# Patient Record
Sex: Female | Born: 1990 | Race: Black or African American | Hispanic: No | Marital: Single | Smoking: Former smoker
Health system: Southern US, Community
[De-identification: ages and names within clinical notes are randomized; demographics above are authoritative.]

## PROBLEM LIST (undated history)

## (undated) ENCOUNTER — Inpatient Hospital Stay (HOSPITAL_COMMUNITY): Payer: Self-pay

## (undated) DIAGNOSIS — N83209 Unspecified ovarian cyst, unspecified side: Secondary | ICD-10-CM

## (undated) DIAGNOSIS — O139 Gestational [pregnancy-induced] hypertension without significant proteinuria, unspecified trimester: Secondary | ICD-10-CM

## (undated) DIAGNOSIS — F329 Major depressive disorder, single episode, unspecified: Secondary | ICD-10-CM

## (undated) DIAGNOSIS — F909 Attention-deficit hyperactivity disorder, unspecified type: Secondary | ICD-10-CM

## (undated) DIAGNOSIS — O24419 Gestational diabetes mellitus in pregnancy, unspecified control: Secondary | ICD-10-CM

## (undated) DIAGNOSIS — I10 Essential (primary) hypertension: Secondary | ICD-10-CM

## (undated) DIAGNOSIS — F319 Bipolar disorder, unspecified: Secondary | ICD-10-CM

## (undated) DIAGNOSIS — F4310 Post-traumatic stress disorder, unspecified: Secondary | ICD-10-CM

## (undated) DIAGNOSIS — R011 Cardiac murmur, unspecified: Secondary | ICD-10-CM

## (undated) DIAGNOSIS — G4730 Sleep apnea, unspecified: Secondary | ICD-10-CM

## (undated) DIAGNOSIS — Z765 Malingerer [conscious simulation]: Secondary | ICD-10-CM

## (undated) DIAGNOSIS — K219 Gastro-esophageal reflux disease without esophagitis: Secondary | ICD-10-CM

## (undated) DIAGNOSIS — F32A Depression, unspecified: Secondary | ICD-10-CM

## (undated) DIAGNOSIS — F419 Anxiety disorder, unspecified: Secondary | ICD-10-CM

## (undated) HISTORY — PX: KNEE SURGERY: SHX244

## (undated) HISTORY — DX: Gestational diabetes mellitus in pregnancy, unspecified control: O24.419

## (undated) HISTORY — DX: Sleep apnea, unspecified: G47.30

## (undated) HISTORY — PX: LEG SURGERY: SHX1003

---

## 2011-11-25 ENCOUNTER — Encounter (HOSPITAL_BASED_OUTPATIENT_CLINIC_OR_DEPARTMENT_OTHER): Payer: Self-pay | Admitting: Emergency Medicine

## 2011-11-25 ENCOUNTER — Emergency Department (HOSPITAL_BASED_OUTPATIENT_CLINIC_OR_DEPARTMENT_OTHER)
Admission: EM | Admit: 2011-11-25 | Discharge: 2011-11-25 | Disposition: A | Discharge status: Home / Self Care | Payer: Medicaid Other | Attending: Emergency Medicine | Admitting: Emergency Medicine

## 2011-11-25 ENCOUNTER — Emergency Department (HOSPITAL_BASED_OUTPATIENT_CLINIC_OR_DEPARTMENT_OTHER): Payer: Medicaid Other

## 2011-11-25 DIAGNOSIS — N949 Unspecified condition associated with female genital organs and menstrual cycle: Secondary | ICD-10-CM | POA: Insufficient documentation

## 2011-11-25 DIAGNOSIS — N760 Acute vaginitis: Secondary | ICD-10-CM | POA: Insufficient documentation

## 2011-11-25 DIAGNOSIS — A599 Trichomoniasis, unspecified: Secondary | ICD-10-CM

## 2011-11-25 DIAGNOSIS — A499 Bacterial infection, unspecified: Secondary | ICD-10-CM | POA: Insufficient documentation

## 2011-11-25 DIAGNOSIS — N390 Urinary tract infection, site not specified: Secondary | ICD-10-CM | POA: Insufficient documentation

## 2011-11-25 DIAGNOSIS — B9689 Other specified bacterial agents as the cause of diseases classified elsewhere: Secondary | ICD-10-CM | POA: Insufficient documentation

## 2011-11-25 DIAGNOSIS — R1032 Left lower quadrant pain: Secondary | ICD-10-CM | POA: Insufficient documentation

## 2011-11-25 HISTORY — DX: Gastro-esophageal reflux disease without esophagitis: K21.9

## 2011-11-25 HISTORY — DX: Cardiac murmur, unspecified: R01.1

## 2011-11-25 LAB — URINALYSIS, ROUTINE W REFLEX MICROSCOPIC
Bilirubin Urine: NEGATIVE
Nitrite: NEGATIVE
Specific Gravity, Urine: 1.014 (ref 1.005–1.030)
pH: 5.5 (ref 5.0–8.0)

## 2011-11-25 LAB — URINE MICROSCOPIC-ADD ON: RBC / HPF: NONE SEEN RBC/hpf (ref <3)

## 2011-11-25 LAB — WET PREP, GENITAL: Yeast Wet Prep HPF POC: NONE SEEN

## 2011-11-25 LAB — PREGNANCY, URINE: Preg Test, Ur: NEGATIVE

## 2011-11-25 MED ORDER — CEPHALEXIN 250 MG PO CAPS
500.0000 mg | ORAL_CAPSULE | Freq: Once | ORAL | Status: AC
  Administered 2011-11-25: 500 mg via ORAL
  Filled 2011-11-25: qty 2

## 2011-11-25 MED ORDER — ONDANSETRON HCL 4 MG PO TABS: 4.0000 mg | ORAL_TABLET | Freq: Four times a day (QID) | ORAL | Status: AC

## 2011-11-25 MED ORDER — METRONIDAZOLE 500 MG PO TABS
2000.0000 mg | ORAL_TABLET | Freq: Once | ORAL | Status: AC
  Administered 2011-11-25: 2000 mg via ORAL
  Filled 2011-11-25: qty 4

## 2011-11-25 MED ORDER — CEPHALEXIN 500 MG PO CAPS: 500.0000 mg | ORAL_CAPSULE | Freq: Four times a day (QID) | ORAL | Status: AC

## 2011-11-25 MED ORDER — ONDANSETRON 4 MG PO TBDP
4.0000 mg | ORAL_TABLET | Freq: Once | ORAL | Status: AC
  Administered 2011-11-25: 4 mg via ORAL
  Filled 2011-11-25: qty 1

## 2011-11-25 NOTE — ED Notes (Signed)
Pt provided with crackers and peanut butter before med admin

## 2011-11-25 NOTE — ED Notes (Signed)
Pt presents today c/o lower L  adominal pain and left flank pain. Pt was seen [facility] 1 month ago and was dx with UTI. Pt advised that she was prescribed abx but did not finish prescription. Pt reports that sitting makes pain worsen. Pt denies dysuria but reports frequency. Pt denies diarrhea but reports vomiting x1 today. Pt denies fever.

## 2011-11-25 NOTE — ED Provider Notes (Signed)
History     CSN: [id number]  Arrival date & time 11/25/11  1229   First MD Initiated Contact with Patient 11/25/11 1305      Chief Complaint  Patient presents with  . Abdominal Pain    lower    (Consider location/radiation/quality/duration/timing/severity/associated sxs/prior treatment) HPI Comments: Patient presents of one month of left lower quadrant abdominal pain it radiates to her left flank. She was treated at another hospital for UTI 1 month ago but did not finish antibiotic. She has urinary frequency without pain. She started her menstrual period today. She has no fever, change in bowel habits. She one episode of vomiting today. She good by mouth intake and urine output.  Patient is a 21 y.o. female presenting with abdominal pain. The history is provided by the patient.  Abdominal Pain The primary symptoms of the illness include abdominal pain, nausea, vomiting and dysuria. The primary symptoms of the illness do not include fever, diarrhea, vaginal discharge or vaginal bleeding.  The dysuria is not associated with hematuria.   Symptoms associated with the illness do not include hematuria or back pain.    Past Medical History  Diagnosis Date  . 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 Acid reflux disease     No past surgical history on file.  No family history on file.  History  Substance Use Topics  . Smoking status: Current Some Day Smoker  . Smokeless tobacco: Never Used  . Alcohol Use: Yes     socially    OB History    Grav Para Term Preterm Abortions TAB SAB Ect Mult Living                  Review of Systems  Constitutional: Negative for fever, activity change and appetite change.  HENT: Negative for congestion and rhinorrhea.   Cardiovascular: Negative for chest pain.  Gastrointestinal: Positive for nausea, vomiting and abdominal pain. Negative for diarrhea and rectal pain.  Genitourinary: Positive for dysuria and pelvic pain. Negative for hematuria,  flank pain, vaginal bleeding and vaginal discharge.  Musculoskeletal: Negative for back pain.  Skin: Negative for rash.  Neurological: Negative for dizziness and headaches.    Allergies  Review of patient's allergies indicates no known allergies.  Home Medications   Current Outpatient Rx  Name Route Sig Dispense Refill  . CEPHALEXIN 500 MG PO CAPS Oral Take 1 capsule (500 mg total) by mouth 4 (four) times daily. 40 capsule 0  . ONDANSETRON HCL 4 MG PO TABS Oral Take 1 tablet (4 mg total) by mouth every 6 (six) hours. 12 tablet 0    BP 125/70  Pulse 84  Temp 98.5 F (36.9 C) (Oral)  Resp 20  Ht 5\' 6"  (1.676 m)  Wt 175 lb (79.379 kg)  BMI 28.25 kg/m2  SpO2 99%  LMP 11/23/2011  Physical Exam  Constitutional: She is oriented to person, place, and time. She appears well-developed and well-nourished. No distress.  HENT:  Head: Normocephalic and atraumatic.  Mouth/Throat: Oropharynx is clear and moist. No oropharyngeal exudate.  Eyes: Conjunctivae are normal. Pupils are equal, round, and reactive to light.  Neck: Normal range of motion.  Cardiovascular: Normal rate, regular rhythm and normal heart sounds.   No murmur heard. Pulmonary/Chest: Effort normal and breath sounds normal. No respiratory distress.  Abdominal: Soft. There is tenderness. There is no rebound and no guarding.       Mild left lower quadrant pain without guarding or rebound  Genitourinary: Cervix exhibits no motion tenderness. Right adnexum displays no mass and no tenderness. Left adnexum displays tenderness. No vaginal discharge found.       Dark blood in vaginal vault  Musculoskeletal: Normal range of motion. She exhibits no edema and no tenderness.       No CVA tenderness  Neurological: She is alert and oriented to person, place, and time. No cranial nerve deficit.  Skin: Skin is warm.    ED Course  Procedures (including critical care time)  Labs Reviewed  URINALYSIS, ROUTINE W REFLEX MICROSCOPIC -  Abnormal; Notable for the following:    APPearance CLOUDY (*)     Leukocytes, UA MODERATE (*)     All other components within normal limits  WET PREP, GENITAL - Abnormal; Notable for the following:    Trich, Wet Prep FEW (*)     Clue Cells Wet Prep HPF POC FEW (*)     WBC, Wet Prep HPF POC FEW (*)     All other components within normal limits  URINE MICROSCOPIC-ADD ON - Abnormal; Notable for the following:    Squamous Epithelial / LPF FEW (*)     Bacteria, UA MANY (*)     All other components within normal limits  PREGNANCY, URINE  GC/CHLAMYDIA PROBE AMP, GENITAL   US Transvaginal Non-ob  11/25/2011  *RADIOLOGY REPORT*  Clinical Data:  Left lower quadrant pain.  Ovarian torsion.  TRANSABDOMINAL AND TRANSVAGINAL ULTRASOUND OF PELVIS DOPPLER ULTRASOUND OF OVARIES  Technique:  Both transabdominal and transvaginal ultrasound examinations of the pelvis were performed. Transabdominal technique was performed for global imaging of the pelvis including uterus, ovaries, adnexal regions, and pelvic cul-de-sac.  It was necessary to proceed with endovaginal exam following the transabdominal exam to visualize the uterus and adnexa.  Color and duplex Doppler ultrasound was utilized to evaluate blood flow to the ovaries.  Comparison:  None.  Findings:  Uterus:  77 mm x 30 mm x 40 mm.  Normal appearance.  Endometrium:  2.5 mm, normal.  Right ovary: 31 mm x 21 mm x 20 mm.  Normal physiologic appearance.  Left ovary:    30 mm x 16 mm x 25 mm.  Normal physiologic appearance.  Pulsed Doppler evaluation demonstrates normal low-resistance arterial and venous waveforms in both ovaries.  IMPRESSION: Normal exam.  No evidence of pelvic mass or other significant abnormality.  No sonographic evidence for ovarian torsion.  Original Report Authenticated By: Andreas Newport, M.D.   US Pelvis Limited  11/25/2011  *RADIOLOGY REPORT*  Clinical Data:  Left lower quadrant pain.  Ovarian torsion.  TRANSABDOMINAL AND TRANSVAGINAL  ULTRASOUND OF PELVIS DOPPLER ULTRASOUND OF OVARIES  Technique:  Both transabdominal and transvaginal ultrasound examinations of the pelvis were performed. Transabdominal technique was performed for global imaging of the pelvis including uterus, ovaries, adnexal regions, and pelvic cul-de-sac.  It was necessary to proceed with endovaginal exam following the transabdominal exam to visualize the uterus and adnexa.  Color and duplex Doppler ultrasound was utilized to evaluate blood flow to the ovaries.  Comparison:  None.  Findings:  Uterus:  77 mm x 30 mm x 40 mm.  Normal appearance.  Endometrium:  2.5 mm, normal.  Right ovary: 31 mm x 21 mm x 20 mm.  Normal physiologic appearance.  Left ovary:    30 mm x 16 mm x 25 mm.  Normal physiologic appearance.  Pulsed Doppler evaluation demonstrates normal low-resistance arterial and venous waveforms in both ovaries.  IMPRESSION: Normal exam.  No evidence of pelvic mass or other significant abnormality.  No sonographic evidence for ovarian torsion.  Original Report Authenticated By: Andreas Newport, M.D.   [place] Art/ven Flow Abd Pelv Doppler  11/25/2011  *RADIOLOGY REPORT*  Clinical Data:  Left lower quadrant pain.  Ovarian torsion.  TRANSABDOMINAL AND TRANSVAGINAL ULTRASOUND OF PELVIS DOPPLER ULTRASOUND OF OVARIES  Technique:  Both transabdominal and transvaginal ultrasound examinations of the pelvis were performed. Transabdominal technique was performed for global imaging of the pelvis including uterus, ovaries, adnexal regions, and pelvic cul-de-sac.  It was necessary to proceed with endovaginal exam following the transabdominal exam to visualize the uterus and adnexa.  Color and duplex Doppler ultrasound was utilized to evaluate blood flow to the ovaries.  Comparison:  None.  Findings:  Uterus:  77 mm x 30 mm x 40 mm.  Normal appearance.  Endometrium:  2.5 mm, normal.  Right ovary: 31 mm x 21 mm x 20 mm.  Normal physiologic appearance.  Left ovary:    30 mm x 16 mm x 25 mm.   Normal physiologic appearance.  Pulsed Doppler evaluation demonstrates normal low-resistance arterial and venous waveforms in both ovaries.  IMPRESSION: Normal exam.  No evidence of pelvic mass or other significant abnormality.  No sonographic evidence for ovarian torsion.  Original Report Authenticated By: Andreas Newport, M.D.     1. Urinary tract infection   2. Trichomonas   3. Bacterial vaginosis       MDM  One month of constant left lower quadrant pain associated with urinary symptoms. Vital stable, no distress. Ovarian torsion less likely given the ongoing nature of pain  [place] negative for torsion or other ovarian pathology.  Labs remarkable for BV, trichomonas, UTI. Results discussed with patient and need for sexual partners to be treated. [facility]      Glynn Octave, MD 11/25/11 [phone]

## 2011-11-26 LAB — GC/CHLAMYDIA PROBE AMP, GENITAL: GC Probe Amp, Genital: NEGATIVE

## 2012-04-07 ENCOUNTER — Encounter (HOSPITAL_BASED_OUTPATIENT_CLINIC_OR_DEPARTMENT_OTHER): Payer: Self-pay

## 2012-04-07 ENCOUNTER — Emergency Department (HOSPITAL_BASED_OUTPATIENT_CLINIC_OR_DEPARTMENT_OTHER)
Admission: EM | Admit: 2012-04-07 | Discharge: 2012-04-07 | Disposition: A | Discharge status: Home / Self Care | Payer: Self-pay | Attending: Emergency Medicine | Admitting: Emergency Medicine

## 2012-04-07 DIAGNOSIS — F17200 Nicotine dependence, unspecified, uncomplicated: Secondary | ICD-10-CM | POA: Insufficient documentation

## 2012-04-07 DIAGNOSIS — F319 Bipolar disorder, unspecified: Secondary | ICD-10-CM | POA: Insufficient documentation

## 2012-04-07 DIAGNOSIS — Z3202 Encounter for pregnancy test, result negative: Secondary | ICD-10-CM | POA: Insufficient documentation

## 2012-04-07 DIAGNOSIS — Z79899 Other long term (current) drug therapy: Secondary | ICD-10-CM | POA: Insufficient documentation

## 2012-04-07 DIAGNOSIS — L299 Pruritus, unspecified: Secondary | ICD-10-CM | POA: Insufficient documentation

## 2012-04-07 DIAGNOSIS — F909 Attention-deficit hyperactivity disorder, unspecified type: Secondary | ICD-10-CM | POA: Insufficient documentation

## 2012-04-07 DIAGNOSIS — R011 Cardiac murmur, unspecified: Secondary | ICD-10-CM | POA: Insufficient documentation

## 2012-04-07 DIAGNOSIS — B86 Scabies: Secondary | ICD-10-CM | POA: Insufficient documentation

## 2012-04-07 DIAGNOSIS — Z8719 Personal history of other diseases of the digestive system: Secondary | ICD-10-CM | POA: Insufficient documentation

## 2012-04-07 HISTORY — DX: Attention-deficit hyperactivity disorder, unspecified type: F90.9

## 2012-04-07 HISTORY — DX: Bipolar disorder, unspecified: F31.9

## 2012-04-07 LAB — PREGNANCY, URINE: Preg Test, Ur: NEGATIVE

## 2012-04-07 MED ORDER — PERMETHRIN 0.4 % AERO: 1.0000 | INHALATION_SPRAY | Freq: Once | Status: DC

## 2012-04-07 MED ORDER — PERMETHRIN 5 % EX CREA: TOPICAL_CREAM | CUTANEOUS | Status: DC

## 2012-04-07 NOTE — ED Notes (Signed)
Generalized rash x 5 days

## 2012-04-07 NOTE — ED Provider Notes (Signed)
History     CSN: [id number]  Arrival date & time 04/07/12  1528   First MD Initiated Contact with Patient 04/07/12 1602      Chief Complaint  Patient presents with  . Rash    (Consider location/radiation/quality/duration/timing/severity/associated sxs/prior treatment) Patient is a 21 y.o. female presenting with rash. The history is provided by the patient.  Rash  This is a new problem. Episode onset: About 5 days. The problem has been gradually worsening. The problem is associated with an unknown factor. Affected Location: Generalized excluding palms, soles and face. The pain is at a severity of 0/10. The patient is experiencing no pain. The pain has been constant since onset. Associated symptoms include itching. She has tried nothing for the symptoms. The treatment provided no relief.    Past Medical History  Diagnosis Date  . 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 Acid reflux disease   . Bipolar disorder   . ADHD (attention deficit hyperactivity disorder)     History reviewed. No pertinent past surgical history.  No family history on file.  History  Substance Use Topics  . Smoking status: Current Some Day Smoker  . Smokeless tobacco: Never Used  . Alcohol Use: No    OB History    Grav Para Term Preterm Abortions TAB SAB Ect Mult Living                  Review of Systems  Genitourinary:       Unknown LMP requesting pregnancy test  Skin: Positive for itching and rash.  All other systems reviewed and are negative.    Allergies  Review of patient's allergies indicates no known allergies.  Home Medications   Current Outpatient Rx  Name  Route  Sig  Dispense  Refill  . QUETIAPINE FUMARATE 300 MG PO TABS   Oral   Take 300 mg by mouth at bedtime.           BP 127/74  Pulse 103  Temp 97.3 F (36.3 C) (Oral)  Resp 20  Ht 5\' 6"  (1.676 m)  Wt 182 lb 4.8 oz (82.691 kg)  BMI 29.42 kg/m2  SpO2 100%  Physical Exam  Nursing note and vitals  reviewed. Constitutional: She is oriented to person, place, and time. She appears well-developed and well-nourished. No distress.  HENT:  Head: Normocephalic and atraumatic.  Mouth/Throat: Oropharynx is clear and moist.  Eyes: Conjunctivae normal and EOM are normal. Pupils are equal, round, and reactive to light.  Neck: Normal range of motion. Neck supple.  Pulmonary/Chest: Effort normal.  Musculoskeletal: Normal range of motion. She exhibits no edema and no tenderness.  Neurological: She is alert and oriented to person, place, and time.  Skin: Skin is warm and dry. Rash noted. Rash is papular. No erythema.       Diffuse excoriated papular rash. No signs of erythema or pustules. Spares palms, soles and face  Psychiatric: She has a normal mood and affect. Her behavior is normal.    ED Course  Procedures (including critical care time)   Labs Reviewed  PREGNANCY, URINE   No results found.   1. Scabies       MDM   Patient with a rash most consistent with scabies. It does affect the arms, legs, torso and finger webs.  Significant other with similar symptoms. No symptoms suggestive of allergic reaction. Patient will be given permethrin and prednisone for itching. Also she is requesting a pregnancy test  Pregnancy neg      Gwyneth Sprout, MD 04/07/12 1626

## 2014-08-06 ENCOUNTER — Emergency Department (HOSPITAL_BASED_OUTPATIENT_CLINIC_OR_DEPARTMENT_OTHER): Payer: Self-pay

## 2014-08-06 ENCOUNTER — Encounter (HOSPITAL_BASED_OUTPATIENT_CLINIC_OR_DEPARTMENT_OTHER): Payer: Self-pay | Admitting: *Deleted

## 2014-08-06 ENCOUNTER — Emergency Department (HOSPITAL_BASED_OUTPATIENT_CLINIC_OR_DEPARTMENT_OTHER)
Admission: EM | Admit: 2014-08-06 | Discharge: 2014-08-07 | Disposition: A | Discharge status: Home / Self Care | Payer: Self-pay | Attending: Emergency Medicine | Admitting: Emergency Medicine

## 2014-08-06 DIAGNOSIS — K219 Gastro-esophageal reflux disease without esophagitis: Secondary | ICD-10-CM | POA: Insufficient documentation

## 2014-08-06 DIAGNOSIS — F319 Bipolar disorder, unspecified: Secondary | ICD-10-CM | POA: Insufficient documentation

## 2014-08-06 DIAGNOSIS — N8320 Unspecified ovarian cysts: Secondary | ICD-10-CM | POA: Insufficient documentation

## 2014-08-06 DIAGNOSIS — Z720 Tobacco use: Secondary | ICD-10-CM | POA: Insufficient documentation

## 2014-08-06 DIAGNOSIS — R1011 Right upper quadrant pain: Secondary | ICD-10-CM

## 2014-08-06 DIAGNOSIS — R011 Cardiac murmur, unspecified: Secondary | ICD-10-CM | POA: Insufficient documentation

## 2014-08-06 DIAGNOSIS — N83202 Unspecified ovarian cyst, left side: Secondary | ICD-10-CM

## 2014-08-06 DIAGNOSIS — Z79899 Other long term (current) drug therapy: Secondary | ICD-10-CM | POA: Insufficient documentation

## 2014-08-06 DIAGNOSIS — Z3202 Encounter for pregnancy test, result negative: Secondary | ICD-10-CM | POA: Insufficient documentation

## 2014-08-06 LAB — URINALYSIS, ROUTINE W REFLEX MICROSCOPIC
BILIRUBIN URINE: NEGATIVE
GLUCOSE, UA: NEGATIVE mg/dL
HGB URINE DIPSTICK: NEGATIVE
KETONES UR: 15 mg/dL — AB
Leukocytes, UA: NEGATIVE
Nitrite: NEGATIVE
PROTEIN: NEGATIVE mg/dL
Specific Gravity, Urine: 1.023 (ref 1.005–1.030)
UROBILINOGEN UA: 1 mg/dL (ref 0.0–1.0)
pH: 6 (ref 5.0–8.0)

## 2014-08-06 LAB — PREGNANCY, URINE: Preg Test, Ur: NEGATIVE

## 2014-08-06 NOTE — ED Notes (Signed)
Abdominal pain x 2 months. States her stomach is jumping.

## 2014-08-06 NOTE — ED Provider Notes (Signed)
CSN: [id number]     Arrival date & time 08/06/14  2159 History  This chart was scribed for Paula Libra, MD by Gwenyth Ober, ED Scribe. This patient was seen in room MH12/MH12 and the patient's care was started at 11:10 PM.    Chief Complaint  Patient presents with  . Abdominal Pain   The history is provided by the patient. No language interpreter was used.    HPI Comments: Cynthia Kent is a 24 y.o. female with a history of GERD who presents to the Emergency Department complaining of abdominal pain that started 2 months ago. Pt characterizes the pain as a sensation of something crawling around her abdomen. It can be moderate to severe at times. She states it is always present. She states nausea and daily episodes of vomiting that occur after eating. Pt has a history of GERD, but does not take any medications for this. She denies constipation, diarrhea, dysuria, vaginal bleeding and vaginal discharge as associated symptoms. She presents tonight because she got her Medicaid card today. There has not been an acute change in her symptomatology.  Past Medical History  Diagnosis Date  . 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 Acid reflux disease   . Bipolar disorder   . ADHD (attention deficit hyperactivity disorder)    History reviewed. No pertinent past surgical history. No family history on file. History  Substance Use Topics  . Smoking status: Current Some Day Smoker -- 0.50 packs/day    Types: Cigarettes  . Smokeless tobacco: Never Used  . Alcohol Use: No   OB History    No data available     Review of Systems  10 Systems reviewed and all are negative for acute change except as noted in the HPI.   Allergies  Review of patient's allergies indicates no known allergies.  Home Medications   Prior to Admission medications   Medication Sig Start Date End Date Taking? Authorizing Provider  permethrin (ELIMITE) 5 % cream Apply to affected area once and repeat in 1 week  04/07/12   Gwyneth Sprout, MD  Permethrin 0.4 % AERO 1 Can by Does not apply route once. 04/07/12   Gwyneth Sprout, MD  QUEtiapine (SEROQUEL) 300 MG tablet Take 300 mg by mouth at bedtime.    Historical Provider, MD   BP 140/90 mmHg  Pulse 67  Temp(Src) 98.7 F (37.1 C) (Oral)  Resp 18  SpO2 100%  LMP 07/12/2014   Physical Exam  Nursing note and vitals reviewed. General: Well-developed, well-nourished female in no acute distress; appearance consistent with age of record HENT: normocephalic; atraumatic Eyes: pupils equal, round and reactive to light; extraocular muscles intact Neck: supple Heart: regular rate and rhythm; no murmurs, rubs or gallops Lungs: clear to auscultation bilaterally Abdomen: soft; nondistended; epigastric, suprapubic and RUQ tenderness; no masses or hepatosplenomegaly; bowel sounds present  Extremities: No deformity; full range of motion; pulses normal Neurologic: Awake, alert and oriented; motor function intact in all extremities and symmetric; no facial droop Skin: Warm and dry Psychiatric: Normal mood and affect  ED Course  Procedures (including critical care time)  DIAGNOSTIC STUDIES: Oxygen Saturation is 100% on RA, normal by my interpretation.    COORDINATION OF CARE: 11:18 PM Discussed treatment plan with pt which includes [place] of her gall bladder and pelvis. Pt agreed to plan.   MDM   Nursing notes and vitals signs, including pulse oximetry, reviewed.  Summary of this visit's results, reviewed by myself:  Labs:  Results for orders placed or performed during the hospital encounter of 08/06/14 (from the past 24 hour(s))  Urinalysis, Routine w reflex microscopic     Status: Abnormal   Collection Time: 08/06/14 11:10 PM  Result Value Ref Range   Color, Urine YELLOW YELLOW   APPearance CLOUDY (A) CLEAR   Specific Gravity, Urine 1.023 1.005 - 1.030   pH 6.0 5.0 - 8.0   Glucose, UA NEGATIVE NEGATIVE mg/dL   Hgb urine dipstick NEGATIVE  NEGATIVE   Bilirubin Urine NEGATIVE NEGATIVE   Ketones, ur 15 (A) NEGATIVE mg/dL   Protein, ur NEGATIVE NEGATIVE mg/dL   Urobilinogen, UA 1.0 0.0 - 1.0 mg/dL   Nitrite NEGATIVE NEGATIVE   Leukocytes, UA NEGATIVE NEGATIVE  Pregnancy, urine     Status: None   Collection Time: 08/06/14 11:10 PM  Result Value Ref Range   Preg Test, Ur NEGATIVE NEGATIVE    Imaging Studies: [place] Abdomen Complete  08/07/2014   CLINICAL DATA:  Abdominal pain for 2 months.  EXAM: ULTRASOUND ABDOMEN COMPLETE  COMPARISON:  None. Examination performed during down time, technologist worksheet not available.  FINDINGS: Gallbladder: No gallstones or wall thickening visualized. No sonographic Murphy sign noted.  Common bile duct: Diameter: 1.3 mm, normal.  Liver: No focal lesion identified. Within normal limits in parenchymal echogenicity.  IVC: No abnormality visualized.  Pancreas: Visualized portion unremarkable.  Spleen: Size and appearance within normal limits.  Right Kidney: Length: 11.5 cm. Echogenicity within normal limits. No mass or hydronephrosis visualized.  Left Kidney: Length: 10.1 cm. Echogenicity within normal limits. No mass or hydronephrosis visualized.  Abdominal aorta: No aneurysm visualized.  Other findings: None.  No ascites.  IMPRESSION: Normal abdominal ultrasound.   Electronically Signed   By: Rubye OaksMelanie  Ehinger M.D.   On: 08/07/2014 00:34   [place] Transvaginal Non-ob  08/07/2014   CLINICAL DATA:  Pelvic pain for 2 months.  EXAM: TRANSABDOMINAL AND TRANSVAGINAL ULTRASOUND OF PELVIS  TECHNIQUE: Both transabdominal and transvaginal ultrasound examinations of the pelvis were performed. Transabdominal technique was performed for global imaging of the pelvis including uterus, ovaries, adnexal regions, and pelvic cul-de-sac. It was necessary to proceed with endovaginal exam following the transabdominal exam to visualize the ovaries.  COMPARISON:  11/25/2011  FINDINGS: Uterus  Measurements: 8 x 4 x 4 cm. No fibroids or  other mass visualized.  Endometrium  Thickness: 11 mm.  No focal abnormality visualized.  Right ovary  Measurements: 10 cc volume. Normal appearance/no adnexal mass.  Left ovary  Measurements: 17 cc volume. Corpus luteum present, likely with small internal hemorrhage.  Other findings  Minimal free fluid.  Exam dictated in down time status. Sonographer report currently not available.  IMPRESSION: 1. No acute findings. 2. Left corpus luteum.   Electronically Signed   By: Marnee SpringJonathon  Watts M.D.   On: 08/07/2014 00:33   [place] Pelvis Complete  08/07/2014   CLINICAL DATA:  Pelvic pain for 2 months.  EXAM: TRANSABDOMINAL AND TRANSVAGINAL ULTRASOUND OF PELVIS  TECHNIQUE: Both transabdominal and transvaginal ultrasound examinations of the pelvis were performed. Transabdominal technique was performed for global imaging of the pelvis including uterus, ovaries, adnexal regions, and pelvic cul-de-sac. It was necessary to proceed with endovaginal exam following the transabdominal exam to visualize the ovaries.  COMPARISON:  11/25/2011  FINDINGS: Uterus  Measurements: 8 x 4 x 4 cm. No fibroids or other mass visualized.  Endometrium  Thickness: 11 mm.  No focal abnormality visualized.  Right ovary  Measurements: 10 cc volume. Normal  appearance/no adnexal mass.  Left ovary  Measurements: 17 cc volume. Corpus luteum present, likely with small internal hemorrhage.  Other findings  Minimal free fluid.  Exam dictated in down time status. Sonographer report currently not available.  IMPRESSION: 1. No acute findings. 2. Left corpus luteum.   Electronically Signed   By: Marnee Spring M.D.   On: 08/07/2014 00:33     Final diagnoses:  RUQ abdominal pain  Gastroesophageal reflux disease without esophagitis  Cyst of left ovary   I personally performed the services described in this documentation, which was scribed in my presence. The recorded information has been reviewed and is accurate.    Paula Libra, MD 08/07/14 [phone]

## 2014-08-06 NOTE — ED Notes (Signed)
Patient transported to Ultrasound 

## 2014-08-06 NOTE — ED Notes (Signed)
Pt encouraged to attempt to obtain urine sample

## 2014-08-07 NOTE — ED Notes (Signed)
See paper charting for downtime reporting and charting.  

## 2014-08-15 ENCOUNTER — Encounter (HOSPITAL_COMMUNITY): Payer: Self-pay | Admitting: *Deleted

## 2014-08-15 ENCOUNTER — Inpatient Hospital Stay (HOSPITAL_COMMUNITY)
Admission: AD | Admit: 2014-08-15 | Discharge: 2014-08-15 | Disposition: A | Discharge status: Home / Self Care | Payer: Self-pay | Source: Ambulatory Visit | Attending: Family Medicine | Admitting: Family Medicine

## 2014-08-15 ENCOUNTER — Inpatient Hospital Stay (HOSPITAL_COMMUNITY): Payer: Medicaid Other

## 2014-08-15 DIAGNOSIS — Z8742 Personal history of other diseases of the female genital tract: Secondary | ICD-10-CM

## 2014-08-15 DIAGNOSIS — F17210 Nicotine dependence, cigarettes, uncomplicated: Secondary | ICD-10-CM | POA: Insufficient documentation

## 2014-08-15 DIAGNOSIS — R1032 Left lower quadrant pain: Secondary | ICD-10-CM | POA: Insufficient documentation

## 2014-08-15 DIAGNOSIS — N8320 Unspecified ovarian cysts: Secondary | ICD-10-CM | POA: Insufficient documentation

## 2014-08-15 DIAGNOSIS — K2100 Gastro-esophageal reflux disease with esophagitis, without bleeding: Secondary | ICD-10-CM

## 2014-08-15 HISTORY — DX: Depression, unspecified: F32.A

## 2014-08-15 HISTORY — DX: Unspecified ovarian cyst, unspecified side: N83.209

## 2014-08-15 HISTORY — DX: Major depressive disorder, single episode, unspecified: F32.9

## 2014-08-15 HISTORY — DX: Anxiety disorder, unspecified: F41.9

## 2014-08-15 LAB — POCT PREGNANCY, URINE: Preg Test, Ur: NEGATIVE

## 2014-08-15 LAB — WET PREP, GENITAL
Clue Cells Wet Prep HPF POC: NONE SEEN
TRICH WET PREP: NONE SEEN
WBC, Wet Prep HPF POC: NONE SEEN
YEAST WET PREP: NONE SEEN

## 2014-08-15 LAB — URINE MICROSCOPIC-ADD ON

## 2014-08-15 LAB — URINALYSIS, ROUTINE W REFLEX MICROSCOPIC
BILIRUBIN URINE: NEGATIVE
GLUCOSE, UA: NEGATIVE mg/dL
Ketones, ur: NEGATIVE mg/dL
Leukocytes, UA: NEGATIVE
Nitrite: NEGATIVE
Protein, ur: NEGATIVE mg/dL
UROBILINOGEN UA: 0.2 mg/dL (ref 0.0–1.0)
pH: 6 (ref 5.0–8.0)

## 2014-08-15 MED ORDER — NORGESTIMATE-ETH ESTRADIOL 0.25-35 MG-MCG PO TABS: 1.0000 | ORAL_TABLET | Freq: Every day | ORAL | Status: DC

## 2014-08-15 MED ORDER — OMEPRAZOLE 20 MG PO CPDR: 20.0000 mg | DELAYED_RELEASE_CAPSULE | Freq: Every day | ORAL | Status: DC

## 2014-08-15 NOTE — MAU Note (Signed)
Lower & mid abd pain for the past 2 months, [facility], dx'd with ovarian cyst, was advised to take motrin 800 mg for pain but pt says she hasn't taken it because it doesn't work.  Started bleeding last night, is not time for her period.
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
## 2014-08-15 NOTE — [facility] Provider Note (Signed)
Chief Complaint: Abdominal Pain   First Provider Initiated Contact with Patient 08/15/14 1748      SUBJECTIVE HPI: Cynthia Kent is a 24 y.o. who presents to maternity admissions reporting abdominal pain x 2 months, increasing at times and improving at times.  She has pain in her upper and lower abdomen described as cramping, burning, and sharp. The pain is associated with nausea and vomiting at least 1 time each day.  She has vaginal bleeding, similar to her menses but not at the time she expected.  She has irregular menses and often goes 4 months between periods.  Patient's last menstrual period was 07/12/2014 (approximate).  She is sexually active and is not using any form of birth control.  She was seen 08/06/14 at [facility] and diagnosed with GERD and ovarian cyst. She has not picked up the GERD medication prescribed at that time.  She was referred to [facility] for Gyn care but when she had increased pain today she came to [facility] since the clinic cannot see her today.  She denies vaginal itching/burning, urinary symptoms, h/a, dizziness, n/v, or fever/chills.    Of note, pt reports GERD diagnosed since childhood and vomiting is frequent for her depending on her diet.  She reports she has never taken medication for her GERD and has not seen a primary care provider in years.  Past Medical History  Diagnosis Date  . 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 Acid reflux disease   . Bipolar disorder   . ADHD (attention deficit hyperactivity disorder)   . Ovarian cyst   . Anxiety   . Depression    Past Surgical History  Procedure Laterality Date  . Leg surgery      when patient was 24 years old   History   Social History  . Marital Status: Single    Spouse Name: N/A  . Number of Children: N/A  . Years of Education: N/A   Occupational History  . Not on file.   Social History Main Topics  . Smoking status: Current Every Day Smoker -- 0.50 packs/day    Types: Cigarettes  .  Smokeless tobacco: Never Used  . Alcohol Use: Yes     Comment: occasionally  . Drug Use: No  . Sexual Activity: Yes    Birth Control/ Protection: None   Other Topics Concern  . Not on file   Social History Narrative   No current facility-administered medications on file prior to encounter.   Current Outpatient Prescriptions on File Prior to Encounter  Medication Sig Dispense Refill  . permethrin (ELIMITE) 5 % cream Apply to affected area once and repeat in 1 week (Patient not taking: Reported on 08/15/2014) 60 g 1  . Permethrin 0.4 % AERO 1 Can by Does not apply route once. (Patient not taking: Reported on 08/15/2014) 150 mL 0   Allergies  Allergen Reactions  . Latex Swelling    Irritates her skin     ROS: Pertinent items in HPI  OBJECTIVE Blood pressure 133/71, pulse 84, temperature 97.9 F (36.6 C), temperature source Oral, resp. rate 20, last menstrual period 07/12/2014. GENERAL: Well-developed, well-nourished female in no acute distress.  HEENT: Normocephalic HEART: normal rate RESP: normal effort ABDOMEN: Soft, non-tender EXTREMITIES: Nontender, no edema NEURO: Alert and oriented Pelvic exam: Cervix pink, visually closed, without lesion, scant white creamy discharge, vaginal walls and external genitalia normal Bimanual exam: Cervix 0/long/high, firm, anterior, neg CMT, uterus nontender, nonenlarged, adnexa  without tenderness, enlargement, or mass  LAB RESULTS Results for orders placed or performed during the hospital encounter of 08/15/14 (from the past 24 hour(s))  Urinalysis, Routine w reflex microscopic     Status: Abnormal   Collection Time: 08/15/14  4:57 PM  Result Value Ref Range   Color, Urine YELLOW YELLOW   APPearance HAZY (A) CLEAR   Specific Gravity, Urine >1.030 (H) 1.005 - 1.030   pH 6.0 5.0 - 8.0   Glucose, UA NEGATIVE NEGATIVE mg/dL   Hgb urine dipstick LARGE (A) NEGATIVE   Bilirubin Urine NEGATIVE NEGATIVE   Ketones, ur NEGATIVE NEGATIVE mg/dL    Protein, ur NEGATIVE NEGATIVE mg/dL   Urobilinogen, UA 0.2 0.0 - 1.0 mg/dL   Nitrite NEGATIVE NEGATIVE   Leukocytes, UA NEGATIVE NEGATIVE  Urine microscopic-add on     Status: Abnormal   Collection Time: 08/15/14  4:57 PM  Result Value Ref Range   Squamous Epithelial / LPF MANY (A) RARE   WBC, UA 0-2 <3 WBC/hpf   RBC / HPF 3-6 <3 RBC/hpf   Bacteria, UA RARE RARE   Urine-Other MUCOUS PRESENT   Pregnancy, urine POC     Status: None   Collection Time: 08/15/14  5:10 PM  Result Value Ref Range   Preg Test, Ur NEGATIVE NEGATIVE  Wet prep, genital     Status: None   Collection Time: 08/15/14  6:03 PM  Result Value Ref Range   Yeast Wet Prep HPF POC NONE SEEN NONE SEEN   Trich, Wet Prep NONE SEEN NONE SEEN   Clue Cells Wet Prep HPF POC NONE SEEN NONE SEEN   WBC, Wet Prep HPF POC NONE SEEN NONE SEEN    IMAGING  [place] Transvaginal Non-ob  08/15/2014   CLINICAL DATA:  Pelvic pain for the past 2 months. Vaginal bleeding. Last menstrual period 07/12/2014.  EXAM: ULTRASOUND PELVIS TRANSVAGINAL  TECHNIQUE: Transvaginal ultrasound examination of the pelvis was performed including evaluation of the uterus, ovaries, adnexal regions, and pelvic cul-de-sac.  COMPARISON:  08/07/2014.  FINDINGS: Uterus  Measurements: 8.3 x 4.3 x 3.4 cm. No fibroids or other mass visualized.  Endometrium  Thickness: 3.3 mm.  No focal abnormality visualized.  Right ovary  Measurements: 3.9 x 1.6 x 1.6 cm. Normal appearance/no adnexal mass.  Left ovary  Measurements: 2.6 x 2.2 x 1.9 cm. Normal appearance/no adnexal mass.  Other findings:  No free fluid  IMPRESSION: Normal examination.   Electronically Signed   By: Beckie SaltsSteven  Reid M.D.   On: 08/15/2014 19:15   [place] Transvaginal Non-ob  08/07/2014   CLINICAL DATA:  Pelvic pain for 2 months.  EXAM: TRANSABDOMINAL AND TRANSVAGINAL ULTRASOUND OF PELVIS  TECHNIQUE: Both transabdominal and transvaginal ultrasound examinations of the pelvis were performed. Transabdominal technique was  performed for global imaging of the pelvis including uterus, ovaries, adnexal regions, and pelvic cul-de-sac. It was necessary to proceed with endovaginal exam following the transabdominal exam to visualize the ovaries.  COMPARISON:  11/25/2011  FINDINGS: Uterus  Measurements: 8 x 4 x 4 cm. No fibroids or other mass visualized.  Endometrium  Thickness: 11 mm.  No focal abnormality visualized.  Right ovary  Measurements: 10 cc volume. Normal appearance/no adnexal mass.  Left ovary  Measurements: 17 cc volume. Corpus luteum present, likely with small internal hemorrhage.  Other findings  Minimal free fluid.  Exam dictated in down time status. Sonographer report currently not available.  IMPRESSION: 1. No acute findings. 2. Left corpus luteum.   Electronically Signed  By: Marnee Spring M.D.   On: 08/07/2014 00:33     ASSESSMENT 1. Gastroesophageal reflux disease with esophagitis   2. LLQ abdominal pain   3. Hx of ovarian cyst     PLAN Discussed functional CLC with pt on previous U/S, absence of cyst today Discharge home Rx for Prilosec daily for GERD, encouraged pt to pick up Rx and start taking Tylenol for pain instead of ibuprofen r/t GERD Discussed contraceptive choices with LARCs presented as most effective.  No personal or family hx of blood clots.  Risks reviewed with pt.  Sprintec daily for contraception for now, pt to f/u in [facility]. Pt has list of primary care providers from [facility] to [facility]  Follow-up Information    Please follow up.   Why:  Primary care provider for managment of acid reflux      Schedule an appointment as soon as possible for a visit with [facility].   Specialty:  Obstetrics and Gynecology   Why:  For routine Gyn care   Contact information:   [address] [place] [address] [phone]      Follow up with [facility] ADMISS[facility].   Why:  As needed for emergencies   Contact  information:   [address] [id number] mc [place] [address] [phone]      Sharen Counter Certified Nurse-Midwife 08/15/2014  7:31 PM

## 2014-08-15 NOTE — Discharge Instructions (Signed)
Food Choices for Gastroesophageal Reflux Disease When you have gastroesophageal reflux disease (GERD), the foods you eat and your eating habits are very important. Choosing the right foods can help ease the discomfort of GERD. WHAT GENERAL GUIDELINES DO I NEED TO FOLLOW?  Choose fruits, vegetables, whole grains, low-fat dairy products, and low-fat meat, fish, and poultry.  Limit fats such as oils, salad dressings, butter, nuts, and avocado.  Keep a food diary to identify foods that cause symptoms.  Avoid foods that cause reflux. These may be different for different people.  Eat frequent small meals instead of three large meals each day.  Eat your meals slowly, in a relaxed setting.  Limit fried foods.  Cook foods using methods other than frying.  Avoid drinking alcohol.  Avoid drinking large amounts of liquids with your meals.  Avoid bending over or lying down until 2-3 hours after eating. WHAT FOODS ARE NOT RECOMMENDED? The following are some foods and drinks that may worsen your symptoms: Vegetables Tomatoes. Tomato juice. Tomato and spaghetti sauce. Chili peppers. Onion and garlic. Horseradish. Fruits Oranges, grapefruit, and lemon (fruit and juice). Meats High-fat meats, fish, and poultry. This includes hot dogs, ribs, ham, sausage, salami, and bacon. Dairy Whole milk and chocolate milk. Sour cream. Cream. Butter. Ice cream. Cream cheese.  Beverages Coffee and tea, with or without caffeine. Carbonated beverages or energy drinks. Condiments Hot sauce. Barbecue sauce.  Sweets/Desserts Chocolate and cocoa. Donuts. Peppermint and spearmint. Fats and Oils High-fat foods, including [place] fries and potato chips. Other Vinegar. Strong spices, such as black pepper, white pepper, red pepper, cayenne, curry powder, cloves, ginger, and chili powder. The items listed above may not be a complete list of foods and beverages to avoid. Contact your dietitian for more  information. Document Released: 04/12/2005 Document Revised: 04/17/2013 Document Reviewed: 02/14/2013 [facility] Patient Information 2015 [place], [place]. This information is not intended to replace advice given to you by your health care provider. Make sure you discuss any questions you have with your health care provider. Gastroesophageal Reflux Disease, Adult Gastroesophageal reflux disease (GERD) happens when acid from your stomach flows up into the esophagus. When acid comes in contact with the esophagus, the acid causes soreness (inflammation) in the esophagus. Over time, GERD may create small holes (ulcers) in the lining of the esophagus. CAUSES   Increased body weight. This puts pressure on the stomach, making acid rise from the stomach into the esophagus.  Smoking. This increases acid production in the stomach.  Drinking alcohol. This causes decreased pressure in the lower esophageal sphincter (valve or ring of muscle between the esophagus and stomach), allowing acid from the stomach into the esophagus.  Late evening meals and a full stomach. This increases pressure and acid production in the stomach.  A malformed lower esophageal sphincter. Sometimes, no cause is found. SYMPTOMS   Burning pain in the lower part of the mid-chest behind the breastbone and in the mid-stomach area. This may occur twice a week or more often.  Trouble swallowing.  Sore throat.  Dry cough.  Asthma-like symptoms including chest tightness, shortness of breath, or wheezing. DIAGNOSIS  Your caregiver may be able to diagnose GERD based on your symptoms. In some cases, X-rays and other tests may be done to check for complications or to check the condition of your stomach and esophagus. TREATMENT  Your caregiver may recommend over-the-counter or prescription medicines to help decrease acid production. Ask your caregiver before starting or adding any new medicines.  HOME CARE  INSTRUCTIONS   Change the  factors that you can control. Ask your caregiver for guidance concerning weight loss, quitting smoking, and alcohol consumption.  Avoid foods and drinks that make your symptoms worse, such as:  Caffeine or alcoholic drinks.  Chocolate.  Peppermint or mint flavorings.  Garlic and onions.  Spicy foods.  Citrus fruits, such as oranges, lemons, or limes.  Tomato-based foods such as sauce, chili, salsa, and pizza.  Fried and fatty foods.  Avoid lying down for the 3 hours prior to your bedtime or prior to taking a nap.  Eat small, frequent meals instead of large meals.  Wear loose-fitting clothing. Do not wear anything tight around your waist that causes pressure on your stomach.  Raise the head of your bed 6 to 8 inches with wood blocks to help you sleep. Extra pillows will not help.  Only take over-the-counter or prescription medicines for pain, discomfort, or fever as directed by your caregiver.  Do not take aspirin, ibuprofen, or other nonsteroidal anti-inflammatory drugs (NSAIDs). SEEK IMMEDIATE MEDICAL CARE IF:   You have pain in your arms, neck, jaw, teeth, or back.  Your pain increases or changes in intensity or duration.  You develop nausea, vomiting, or sweating (diaphoresis).  You develop shortness of breath, or you faint.  Your vomit is green, yellow, black, or looks like coffee grounds or blood.  Your stool is red, bloody, or black. These symptoms could be signs of other problems, such as heart disease, gastric bleeding, or esophageal bleeding. MAKE SURE YOU:   Understand these instructions.  Will watch your condition.  Will get help right away if you are not doing well or get worse. Document Released: 01/20/2005 Document Revised: 07/05/2011 Document Reviewed: 10/30/2010 [facility] Patient Information 2015 [place], [place]. This information is not intended to replace advice given to you by your health care provider. Make sure you discuss any questions you have  with your health care provider.  Pelvic Pain Female pelvic pain can be caused by many different things and start from a variety of places. Pelvic pain refers to pain that is located in the lower half of the abdomen and between your hips. The pain may occur over a short period of time (acute) or may be reoccurring (chronic). The cause of pelvic pain may be related to disorders affecting the female reproductive organs (gynecologic), but it may also be related to the bladder, kidney stones, an intestinal complication, or muscle or skeletal problems. Getting help right away for pelvic pain is important, especially if there has been severe, sharp, or a sudden onset of unusual pain. It is also important to get help right away because some types of pelvic pain can be life threatening.  CAUSES  Below are only some of the causes of pelvic pain. The causes of pelvic pain can be in one of several categories.   Gynecologic.  Pelvic inflammatory disease.  Sexually transmitted infection.  Ovarian cyst or a twisted ovarian ligament (ovarian torsion).  Uterine lining that grows outside the uterus (endometriosis).  Fibroids, cysts, or tumors.  Ovulation.  Pregnancy.  Pregnancy that occurs outside the uterus (ectopic pregnancy).  Miscarriage.  Labor.  Abruption of the placenta or ruptured uterus.  Infection.  Uterine infection (endometritis).  Bladder infection.  Diverticulitis.  Miscarriage related to a uterine infection (septic abortion).  Bladder.  Inflammation of the bladder (cystitis).  Kidney stone(s).  Gastrointestinal.  Constipation.  Diverticulitis.  Neurologic.  Trauma.  Feeling pelvic pain because of mental or emotional  causes (psychosomatic).  Cancers of the bowel or pelvis. EVALUATION  Your caregiver will want to take a careful history of your concerns. This includes recent changes in your health, a careful gynecologic history of your periods (menses), and a  sexual history. Obtaining your family history and medical history is also important. Your caregiver may suggest a pelvic exam. A pelvic exam will help identify the location and severity of the pain. It also helps in the evaluation of which organ system may be involved. In order to identify the cause of the pelvic pain and be properly treated, your caregiver may order tests. These tests may include:   A pregnancy test.  Pelvic ultrasonography.  An X-ray exam of the abdomen.  A urinalysis or evaluation of vaginal discharge.  Blood tests. HOME CARE INSTRUCTIONS   Only take over-the-counter or prescription medicines for pain, discomfort, or fever as directed by your caregiver.   Rest as directed by your caregiver.   Eat a balanced diet.   Drink enough fluids to make your urine clear or pale yellow, or as directed.   Avoid sexual intercourse if it causes pain.   Apply warm or cold compresses to the lower abdomen depending on which one helps the pain.   Avoid stressful situations.   Keep a journal of your pelvic pain. Write down when it started, where the pain is located, and if there are things that seem to be associated with the pain, such as food or your menstrual cycle.  Follow up with your caregiver as directed.  SEEK MEDICAL CARE IF:  Your medicine does not help your pain.  You have abnormal vaginal discharge. SEEK IMMEDIATE MEDICAL CARE IF:   You have heavy bleeding from the vagina.   Your pelvic pain increases.   You feel light-headed or faint.   You have chills.   You have pain with urination or blood in your urine.   You have uncontrolled diarrhea or vomiting.   You have a fever or persistent symptoms for more than 3 days.  You have a fever and your symptoms suddenly get worse.   You are being physically or sexually abused.  MAKE SURE YOU:  Understand these instructions.  Will watch your condition.  Will get help if you are not doing  well or get worse. Document Released: 03/09/2004 Document Revised: 08/27/2013 Document Reviewed: 08/02/2011 [facility] Patient Information 2015 [place]. This information is not intended to replace advice given to you by your health care provider. Make sure you discuss any questions you have with your health care provider.

## 2014-08-16 LAB — GC/CHLAMYDIA PROBE AMP (~~LOC~~) NOT AT ARMC
Chlamydia: NEGATIVE
Neisseria Gonorrhea: NEGATIVE

## 2014-11-14 ENCOUNTER — Emergency Department (HOSPITAL_BASED_OUTPATIENT_CLINIC_OR_DEPARTMENT_OTHER): Payer: Medicaid Other

## 2014-11-14 ENCOUNTER — Emergency Department (HOSPITAL_BASED_OUTPATIENT_CLINIC_OR_DEPARTMENT_OTHER)
Admission: EM | Admit: 2014-11-14 | Discharge: 2014-11-14 | Disposition: A | Discharge status: Home / Self Care | Payer: Medicaid Other | Attending: Emergency Medicine | Admitting: Emergency Medicine

## 2014-11-14 ENCOUNTER — Encounter (HOSPITAL_BASED_OUTPATIENT_CLINIC_OR_DEPARTMENT_OTHER): Payer: Self-pay | Admitting: *Deleted

## 2014-11-14 DIAGNOSIS — Z720 Tobacco use: Secondary | ICD-10-CM | POA: Insufficient documentation

## 2014-11-14 DIAGNOSIS — R109 Unspecified abdominal pain: Secondary | ICD-10-CM

## 2014-11-14 DIAGNOSIS — B9689 Other specified bacterial agents as the cause of diseases classified elsewhere: Secondary | ICD-10-CM

## 2014-11-14 DIAGNOSIS — Z3202 Encounter for pregnancy test, result negative: Secondary | ICD-10-CM | POA: Insufficient documentation

## 2014-11-14 DIAGNOSIS — Z793 Long term (current) use of hormonal contraceptives: Secondary | ICD-10-CM | POA: Insufficient documentation

## 2014-11-14 DIAGNOSIS — K219 Gastro-esophageal reflux disease without esophagitis: Secondary | ICD-10-CM | POA: Insufficient documentation

## 2014-11-14 DIAGNOSIS — Z91040 Latex allergy status: Secondary | ICD-10-CM | POA: Insufficient documentation

## 2014-11-14 DIAGNOSIS — E669 Obesity, unspecified: Secondary | ICD-10-CM | POA: Insufficient documentation

## 2014-11-14 DIAGNOSIS — Z8659 Personal history of other mental and behavioral disorders: Secondary | ICD-10-CM | POA: Insufficient documentation

## 2014-11-14 DIAGNOSIS — N760 Acute vaginitis: Secondary | ICD-10-CM | POA: Insufficient documentation

## 2014-11-14 DIAGNOSIS — R011 Cardiac murmur, unspecified: Secondary | ICD-10-CM | POA: Insufficient documentation

## 2014-11-14 LAB — URINALYSIS, ROUTINE W REFLEX MICROSCOPIC
GLUCOSE, UA: NEGATIVE mg/dL
Hgb urine dipstick: NEGATIVE
Ketones, ur: 15 mg/dL — AB
NITRITE: NEGATIVE
PH: 5.5 (ref 5.0–8.0)
Protein, ur: 30 mg/dL — AB
Specific Gravity, Urine: 1.033 — ABNORMAL HIGH (ref 1.005–1.030)
Urobilinogen, UA: 1 mg/dL (ref 0.0–1.0)

## 2014-11-14 LAB — COMPREHENSIVE METABOLIC PANEL
ALBUMIN: 4.7 g/dL (ref 3.5–5.0)
ALT: 16 U/L (ref 14–54)
AST: 24 U/L (ref 15–41)
Alkaline Phosphatase: 49 U/L (ref 38–126)
Anion gap: 8 (ref 5–15)
BUN: 15 mg/dL (ref 6–20)
CHLORIDE: 109 mmol/L (ref 101–111)
CO2: 24 mmol/L (ref 22–32)
Calcium: 9.8 mg/dL (ref 8.9–10.3)
Creatinine, Ser: 1.18 mg/dL — ABNORMAL HIGH (ref 0.44–1.00)
Glucose, Bld: 92 mg/dL (ref 65–99)
POTASSIUM: 3.8 mmol/L (ref 3.5–5.1)
Sodium: 141 mmol/L (ref 135–145)
TOTAL PROTEIN: 7.7 g/dL (ref 6.5–8.1)
Total Bilirubin: 0.7 mg/dL (ref 0.3–1.2)

## 2014-11-14 LAB — CBC WITH DIFFERENTIAL/PLATELET
Basophils Absolute: 0 10*3/uL (ref 0.0–0.1)
Basophils Relative: 0 % (ref 0–1)
EOS ABS: 0.2 10*3/uL (ref 0.0–0.7)
Eosinophils Relative: 2 % (ref 0–5)
HCT: 40.6 % (ref 36.0–46.0)
Hemoglobin: 13.7 g/dL (ref 12.0–15.0)
LYMPHS ABS: 2.5 10*3/uL (ref 0.7–4.0)
Lymphocytes Relative: 30 % (ref 12–46)
MCH: 29.8 pg (ref 26.0–34.0)
MCHC: 33.7 g/dL (ref 30.0–36.0)
MCV: 88.5 fL (ref 78.0–100.0)
MONOS PCT: 9 % (ref 3–12)
Monocytes Absolute: 0.8 10*3/uL (ref 0.1–1.0)
Neutro Abs: 5 10*3/uL (ref 1.7–7.7)
Neutrophils Relative %: 59 % (ref 43–77)
PLATELETS: 251 10*3/uL (ref 150–400)
RBC: 4.59 MIL/uL (ref 3.87–5.11)
RDW: 13.4 % (ref 11.5–15.5)
WBC: 8.5 10*3/uL (ref 4.0–10.5)

## 2014-11-14 LAB — URINE MICROSCOPIC-ADD ON

## 2014-11-14 LAB — PREGNANCY, URINE: PREG TEST UR: NEGATIVE

## 2014-11-14 LAB — WET PREP, GENITAL
Trich, Wet Prep: NONE SEEN
Yeast Wet Prep HPF POC: NONE SEEN

## 2014-11-14 LAB — LIPASE, BLOOD: LIPASE: 20 U/L — AB (ref 22–51)

## 2014-11-14 MED ORDER — OMEPRAZOLE 20 MG PO CPDR: 20.0000 mg | DELAYED_RELEASE_CAPSULE | Freq: Every day | ORAL | Status: DC

## 2014-11-14 MED ORDER — METRONIDAZOLE 500 MG PO TABS: 500.0000 mg | ORAL_TABLET | Freq: Two times a day (BID) | ORAL | Status: DC

## 2014-11-14 MED ORDER — ONDANSETRON 4 MG PO TBDP
4.0000 mg | ORAL_TABLET | Freq: Once | ORAL | Status: AC
  Administered 2014-11-14: 4 mg via ORAL
  Filled 2014-11-14: qty 1

## 2014-11-14 MED ORDER — IOHEXOL 300 MG/ML  SOLN
25.0000 mL | Freq: Once | INTRAMUSCULAR | Status: AC | PRN
  Administered 2014-11-14: 25 mL via ORAL

## 2014-11-14 MED ORDER — ONDANSETRON HCL 4 MG PO TABS: 4.0000 mg | ORAL_TABLET | Freq: Four times a day (QID) | ORAL | Status: DC

## 2014-11-14 MED ORDER — IOHEXOL 300 MG/ML  SOLN
100.0000 mL | Freq: Once | INTRAMUSCULAR | Status: AC | PRN
  Administered 2014-11-14: 100 mL via INTRAVENOUS

## 2014-11-14 NOTE — ED Notes (Signed)
Pt reports nausea and pain improved.

## 2014-11-14 NOTE — ED Provider Notes (Signed)
CSN: [id number]     Arrival date & time 11/14/14  1756 History  This chart was scribed for Glynn Octave, MD by Budd Palmer, ED Scribe. This patient was seen in room MH08/MH08 and the patient's care was started at 6:16 PM.    Chief Complaint  Patient presents with  . Abdominal Pain   The history is provided by the patient. No language interpreter was used.   HPI Comments: Cynthia Kent is a 24 y.o. female with a PMHx of ovarian cyst who presents to the Emergency Department complaining of constant, aching lower right abdominal pain onset 1 month ago. She states that this pain feels like a pressure, similar to her previous episode of an ovarian cyst. She reports associated nausea and vomiting every morning for the past month. She has not been sleeping well and notes an increased appetite. She tried taking motrin, with no relief.  Her LNMP was around 07/04. She has a PMHx of a heart murmur. She denies a PSHx of the abdomen. Pt denies dysuria, hematuria, and abnormal BMs.  Past Medical History  Diagnosis Date  . 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 Acid reflux disease   . Bipolar disorder   . ADHD (attention deficit hyperactivity disorder)   . Ovarian cyst   . Anxiety   . Depression    Past Surgical History  Procedure Laterality Date  . Leg surgery      when patient was 24 years old   History reviewed. No pertinent family history. History  Substance Use Topics  . Smoking status: Current Every Day Smoker -- 0.50 packs/day    Types: Cigarettes  . Smokeless tobacco: Never Used  . Alcohol Use: Yes     Comment: occasionally   OB History    No data available     Review of SystemsA complete 10 system review of systems was obtained and all systems are negative except as noted in the HPI and PMH.    Allergies  Latex  Home Medications   Prior to Admission medications   Medication Sig Start Date End Date Taking? Authorizing Provider  metroNIDAZOLE (FLAGYL) 500 MG tablet  Take 1 tablet (500 mg total) by mouth 2 (two) times daily. 11/14/14   Glynn Octave, MD  norgestimate-ethinyl estradiol (ORTHO-CYCLEN,SPRINTEC,PREVIFEM) 0.25-35 MG-MCG tablet Take 1 tablet by mouth daily. 08/15/14   Misty Stanley A Leftwich-Kirby, CNM  omeprazole (PRILOSEC) 20 MG capsule Take 1 capsule (20 mg total) by mouth daily. 11/14/14   Glynn Octave, MD  ondansetron (ZOFRAN) 4 MG tablet Take 1 tablet (4 mg total) by mouth every 6 (six) hours. 11/14/14   Glynn Octave, MD   BP 118/74 mmHg  Pulse 82  Temp(Src) 98.8 F (37.1 C) (Oral)  Resp 18  Ht 5\' 6"  (1.676 m)  Wt 180 lb (81.647 kg)  BMI 29.07 kg/m2  SpO2 98%  LMP 10/25/2014 Physical Exam  Constitutional: She is oriented to person, place, and time. She appears well-developed and well-nourished. No distress.  Pt is obese  HENT:  Head: Normocephalic and atraumatic.  Mouth/Throat: Oropharynx is clear and moist. No oropharyngeal exudate.  Eyes: Conjunctivae and EOM are normal. Pupils are equal, round, and reactive to light.  Neck: Normal range of motion. Neck supple.  No meningismus.  Cardiovascular: Normal rate, regular rhythm, normal heart sounds and intact distal pulses.   No murmur heard. Pulmonary/Chest: Effort normal and breath sounds normal. No respiratory distress.  Abdominal: Soft. There is no tenderness. There is no  rebound and no guarding.  Right- sided abdominal pain, no guarding or rebound.   Genitourinary: Vagina normal and uterus normal.  Musculoskeletal: Normal range of motion. She exhibits no edema or tenderness.  No CVA tenderness  Neurological: She is alert and oriented to person, place, and time. No cranial nerve deficit. She exhibits normal muscle tone. Coordination normal.  No ataxia on finger to nose bilaterally. No pronator drift. 5/5 strength throughout. CN 2-12 intact. Negative Romberg. Equal grip strength. Sensation intact. Gait is normal.   Skin: Skin is warm.  Psychiatric: She has a normal mood and affect.  Her behavior is normal.  Nursing note and vitals reviewed.   ED Course  Procedures  DIAGNOSTIC STUDIES: Oxygen Saturation is 100% on RA, normal by my interpretation.    COORDINATION OF CARE: 6:23 PM - Discussed plans to perform a pelvic exam and diagnostic studies of the blood and urine. Pt advised of plan for treatment and pt agrees.  6:39 PM - Performed pelvic exam. Female Chaperone present. No abnormalities found.  Labs Review Labs Reviewed  WET PREP, GENITAL - Abnormal; Notable for the following:    Clue Cells Wet Prep HPF POC MODERATE (*)    WBC, Wet Prep HPF POC FEW (*)    All other components within normal limits  URINALYSIS, ROUTINE W REFLEX MICROSCOPIC (NOT AT [facility]) - Abnormal; Notable for the following:    Color, Urine AMBER (*)    APPearance TURBID (*)    Specific Gravity, Urine 1.033 (*)    Bilirubin Urine SMALL (*)    Ketones, ur 15 (*)    Protein, ur 30 (*)    Leukocytes, UA SMALL (*)    All other components within normal limits  COMPREHENSIVE METABOLIC PANEL - Abnormal; Notable for the following:    Creatinine, Ser 1.18 (*)    All other components within normal limits  LIPASE, BLOOD - Abnormal; Notable for the following:    Lipase 20 (*)    All other components within normal limits  URINE MICROSCOPIC-ADD ON - Abnormal; Notable for the following:    Squamous Epithelial / LPF MANY (*)    Bacteria, UA FEW (*)    Casts HYALINE CASTS (*)    Crystals CA OXALATE CRYSTALS (*)    All other components within normal limits  PREGNANCY, URINE  CBC WITH DIFFERENTIAL/PLATELET  GC/CHLAMYDIA PROBE AMP () NOT AT [facility]    Imaging Review Ct Abdomen Pelvis W Contrast  11/14/2014   CLINICAL DATA:  RIGHT lower quadrant pain, nausea, vomiting, bloating and pressure in sitting position, chronic pain increased over past 2 days, history acid reflux, smoking  EXAM: CT ABDOMEN AND PELVIS WITH CONTRAST  TECHNIQUE: Multidetector CT imaging of the abdomen and pelvis was  performed using the standard protocol following bolus administration of intravenous contrast. Sagittal and coronal MPR images reconstructed from axial data set.  CONTRAST:  OMNIPAQUE IOHEXOL 300 MG/ML SOLN IV, 25mL OMNIPAQUE IOHEXOL 300 MG/ML SOLN PO  COMPARISON:  None  FINDINGS: Lung bases clear.  Liver, gallbladder, spleen, pancreas, kidneys, and adrenal glands normal.  Normal appendix.  Few normal sized lymph nodes medial to cecum incidentally noted.  Unremarkable bladder, ureters, uterus and adnexa.  Stomach and bowel loops normal appearance.  No mass, adenopathy, free air, free fluid or inflammatory process.  No hernia or acute bone lesion.  IMPRESSION: Normal exam.   Electronically Signed   By: Ulyses Southward M.D.   On: 11/14/2014 20:45     EKG  Interpretation None      MDM   Final diagnoses:  Abdominal pain, unspecified abdominal location  Bacterial vaginosis    Diffuse abdominal pain worse over the past month. History of ovarian cysts but that is usually on the other side. Complains of pain in the right side of her abdomen with nausea and vomiting.  HCG is negative. Urinalysis is contaminated. Pelvic exam performed as above and is benign. Pelvic ultrasound in April 2016 was unremarkable  CT today shows no acute pathology. Labs are unremarkable. Appendix is normal.  She is tolerating by mouth. She has gone to the waiting room multiple times to visit with her friend.  She appears stable for discharge. We'll treat bacterial vaginosis. Start PPI. Pain is been ongoing for the past one month. Doubt ovarian torsion.  ,I personally performed the services described in this documentation, which was scribed in my presence. The recorded information has been reviewed and is accurate.Glynn Octave, MD 11/14/14 2340

## 2014-11-14 NOTE — Discharge Instructions (Signed)
Abdominal Pain Take the stomach medication as prescribed. Follow up with the stomach doctor. Return to the ED if you develop new or worsening symptoms. Many things can cause abdominal pain. Usually, abdominal pain is not caused by a disease and will improve without treatment. It can often be observed and treated at home. Your health care provider will do a physical exam and possibly order blood tests and X-rays to help determine the seriousness of your pain. However, in many cases, more time must pass before a clear cause of the pain can be found. Before that point, your health care provider may not know if you need more testing or further treatment. HOME CARE INSTRUCTIONS  Monitor your abdominal pain for any changes. The following actions may help to alleviate any discomfort you are experiencing:  Only take over-the-counter or prescription medicines as directed by your health care provider.  Do not take laxatives unless directed to do so by your health care provider.  Try a clear liquid diet (broth, tea, or water) as directed by your health care provider. Slowly move to a bland diet as tolerated. SEEK MEDICAL CARE IF:  You have unexplained abdominal pain.  You have abdominal pain associated with nausea or diarrhea.  You have pain when you urinate or have a bowel movement.  You experience abdominal pain that wakes you in the night.  You have abdominal pain that is worsened or improved by eating food.  You have abdominal pain that is worsened with eating fatty foods.  You have a fever. SEEK IMMEDIATE MEDICAL CARE IF:   Your pain does not go away within 2 hours.  You keep throwing up (vomiting).  Your pain is felt only in portions of the abdomen, such as the right side or the left lower portion of the abdomen.  You pass bloody or black tarry stools. MAKE SURE YOU:  Understand these instructions.   Will watch your condition.   Will get help right away if you are not doing well  or get worse.  Document Released: 01/20/2005 Document Revised: 04/17/2013 Document Reviewed: 12/20/2012 [facility] Patient Information 2015 [place], [place]. This information is not intended to replace advice given to you by your health care provider. Make sure you discuss any questions you have with your health care provider.

## 2014-11-14 NOTE — ED Notes (Signed)
Pt in ED lobby for second time to charge cell phone-NAD

## 2014-11-14 NOTE — ED Notes (Signed)
Pt c/o lower abd pain x 1 month HX ovarian cyst

## 2014-11-14 NOTE — ED Notes (Signed)
Tolerated fluids well denies current nausea

## 2014-11-15 LAB — GC/CHLAMYDIA PROBE AMP (~~LOC~~) NOT AT ARMC
Chlamydia: NEGATIVE
Neisseria Gonorrhea: NEGATIVE

## 2015-06-18 ENCOUNTER — Encounter (HOSPITAL_COMMUNITY): Payer: Self-pay | Admitting: Emergency Medicine

## 2015-06-18 ENCOUNTER — Emergency Department (HOSPITAL_COMMUNITY): Payer: Self-pay

## 2015-06-18 ENCOUNTER — Emergency Department (HOSPITAL_COMMUNITY)
Admission: EM | Admit: 2015-06-18 | Discharge: 2015-06-18 | Disposition: A | Discharge status: Home / Self Care | Payer: Self-pay | Attending: Emergency Medicine | Admitting: Emergency Medicine

## 2015-06-18 DIAGNOSIS — Z79899 Other long term (current) drug therapy: Secondary | ICD-10-CM | POA: Insufficient documentation

## 2015-06-18 DIAGNOSIS — R1030 Lower abdominal pain, unspecified: Secondary | ICD-10-CM | POA: Insufficient documentation

## 2015-06-18 DIAGNOSIS — Z793 Long term (current) use of hormonal contraceptives: Secondary | ICD-10-CM | POA: Insufficient documentation

## 2015-06-18 DIAGNOSIS — R0602 Shortness of breath: Secondary | ICD-10-CM | POA: Insufficient documentation

## 2015-06-18 DIAGNOSIS — R42 Dizziness and giddiness: Secondary | ICD-10-CM | POA: Insufficient documentation

## 2015-06-18 DIAGNOSIS — R002 Palpitations: Secondary | ICD-10-CM | POA: Insufficient documentation

## 2015-06-18 DIAGNOSIS — R1013 Epigastric pain: Secondary | ICD-10-CM | POA: Insufficient documentation

## 2015-06-18 DIAGNOSIS — K219 Gastro-esophageal reflux disease without esophagitis: Secondary | ICD-10-CM | POA: Insufficient documentation

## 2015-06-18 DIAGNOSIS — Z792 Long term (current) use of antibiotics: Secondary | ICD-10-CM | POA: Insufficient documentation

## 2015-06-18 DIAGNOSIS — Z8742 Personal history of other diseases of the female genital tract: Secondary | ICD-10-CM | POA: Insufficient documentation

## 2015-06-18 DIAGNOSIS — J111 Influenza due to unidentified influenza virus with other respiratory manifestations: Secondary | ICD-10-CM | POA: Insufficient documentation

## 2015-06-18 DIAGNOSIS — M545 Low back pain: Secondary | ICD-10-CM | POA: Insufficient documentation

## 2015-06-18 DIAGNOSIS — Z3202 Encounter for pregnancy test, result negative: Secondary | ICD-10-CM | POA: Insufficient documentation

## 2015-06-18 DIAGNOSIS — R109 Unspecified abdominal pain: Secondary | ICD-10-CM

## 2015-06-18 DIAGNOSIS — Z91040 Latex allergy status: Secondary | ICD-10-CM | POA: Insufficient documentation

## 2015-06-18 DIAGNOSIS — R011 Cardiac murmur, unspecified: Secondary | ICD-10-CM | POA: Insufficient documentation

## 2015-06-18 DIAGNOSIS — F419 Anxiety disorder, unspecified: Secondary | ICD-10-CM | POA: Insufficient documentation

## 2015-06-18 DIAGNOSIS — F17210 Nicotine dependence, cigarettes, uncomplicated: Secondary | ICD-10-CM | POA: Insufficient documentation

## 2015-06-18 DIAGNOSIS — R61 Generalized hyperhidrosis: Secondary | ICD-10-CM | POA: Insufficient documentation

## 2015-06-18 DIAGNOSIS — R69 Illness, unspecified: Secondary | ICD-10-CM

## 2015-06-18 LAB — URINE MICROSCOPIC-ADD ON

## 2015-06-18 LAB — COMPREHENSIVE METABOLIC PANEL
ALBUMIN: 4 g/dL (ref 3.5–5.0)
ALK PHOS: 49 U/L (ref 38–126)
ALT: 16 U/L (ref 14–54)
AST: 19 U/L (ref 15–41)
Anion gap: 8 (ref 5–15)
BUN: 8 mg/dL (ref 6–20)
CALCIUM: 9.5 mg/dL (ref 8.9–10.3)
CO2: 25 mmol/L (ref 22–32)
Chloride: 107 mmol/L (ref 101–111)
Creatinine, Ser: 0.85 mg/dL (ref 0.44–1.00)
GFR calc Af Amer: >60 mL/min (ref >60)
GFR calc non Af Amer: >60 mL/min (ref >60)
GLUCOSE: 100 mg/dL — AB (ref 65–99)
Potassium: 4 mmol/L (ref 3.5–5.1)
Sodium: 140 mmol/L (ref 135–145)
TOTAL PROTEIN: 6.8 g/dL (ref 6.5–8.1)

## 2015-06-18 LAB — CBC
HCT: 39 % (ref 36.0–46.0)
Hemoglobin: 12.8 g/dL (ref 12.0–15.0)
MCH: 30 pg (ref 26.0–34.0)
MCHC: 32.8 g/dL (ref 30.0–36.0)
MCV: 91.5 fL (ref 78.0–100.0)
Platelets: 218 10*3/uL (ref 150–400)
RBC: 4.26 MIL/uL (ref 3.87–5.11)
RDW: 13.8 % (ref 11.5–15.5)
WBC: 9 10*3/uL (ref 4.0–10.5)

## 2015-06-18 LAB — URINALYSIS, ROUTINE W REFLEX MICROSCOPIC
Glucose, UA: NEGATIVE mg/dL
KETONES UR: 15 mg/dL — AB
NITRITE: NEGATIVE
PH: 5.5 (ref 5.0–8.0)
Protein, ur: 100 mg/dL — AB
Specific Gravity, Urine: 1.023 (ref 1.005–1.030)

## 2015-06-18 LAB — LIPASE, BLOOD: Lipase: 31 U/L (ref 11–51)

## 2015-06-18 LAB — I-STAT BETA HCG BLOOD, ED (MC, WL, AP ONLY)

## 2015-06-18 MED ORDER — OXYCODONE-ACETAMINOPHEN 5-325 MG PO TABS
1.0000 | ORAL_TABLET | Freq: Once | ORAL | Status: AC
  Administered 2015-06-18: 1 via ORAL

## 2015-06-18 MED ORDER — MORPHINE SULFATE (PF) 4 MG/ML IV SOLN
4.0000 mg | Freq: Once | INTRAVENOUS | Status: AC
  Administered 2015-06-18: 4 mg via INTRAVENOUS
  Filled 2015-06-18: qty 1

## 2015-06-18 MED ORDER — IBUPROFEN 800 MG PO TABS: 800.0000 mg | ORAL_TABLET | Freq: Three times a day (TID) | ORAL | Status: DC | PRN

## 2015-06-18 MED ORDER — OXYCODONE-ACETAMINOPHEN 5-325 MG PO TABS
ORAL_TABLET | ORAL | Status: AC
  Filled 2015-06-18: qty 1

## 2015-06-18 MED ORDER — HYDROCODONE-ACETAMINOPHEN 5-325 MG PO TABS: 1.0000 | ORAL_TABLET | Freq: Four times a day (QID) | ORAL | Status: DC | PRN

## 2015-06-18 MED ORDER — PROMETHAZINE HCL 25 MG PO TABS: 25.0000 mg | ORAL_TABLET | Freq: Three times a day (TID) | ORAL | Status: DC | PRN

## 2015-06-18 MED ORDER — SODIUM CHLORIDE 0.9 % IV BOLUS (SEPSIS)
1000.0000 mL | Freq: Once | INTRAVENOUS | Status: AC
  Administered 2015-06-18: 1000 mL via INTRAVENOUS

## 2015-06-18 MED ORDER — ONDANSETRON HCL 4 MG/2ML IJ SOLN
4.0000 mg | Freq: Once | INTRAMUSCULAR | Status: AC
  Administered 2015-06-18: 4 mg via INTRAVENOUS
  Filled 2015-06-18: qty 2

## 2015-06-18 NOTE — ED Notes (Signed)
PA at the bedside.

## 2015-06-18 NOTE — Discharge Instructions (Signed)
Return here as needed.  Follow-up with the clinic provided.  Increase her fluid intake and rest as much as possible.  Her CT scan did not show any abnormality.  However, there should be follow-up on your flank pain.  He most likely have an influenza illness, based on your complaints of body aches, cough and chills

## 2015-06-18 NOTE — ED Notes (Signed)
Pt returned from CT °

## 2015-06-18 NOTE — ED Provider Notes (Signed)
CSN: [id number]     Arrival date & time 06/18/15  0001 History   First MD Initiated Contact with Patient 06/18/15 0703     Chief Complaint  Patient presents with  . Pelvic Pain  . Abdominal Pain  . Flank Pain     (Consider location/radiation/quality/duration/timing/severity/associated sxs/prior Treatment) Patient is a 25 y.o. female presenting with pelvic pain, abdominal pain, and flank pain.  Pelvic Pain Associated symptoms include abdominal pain.  Abdominal Pain Flank Pain Associated symptoms include abdominal pain.   Patient presents to the Emergency Department complaining of abdominal pain, R flank pain, and back pain. Patient has  PMH of BPAD, GERD, and ovarian cyst. She is a current smoker smoking 0.5 PPD. Patient states that she began having lower abdominal pain and lower back pain two weeks ago. It was gradual, patient was able to tolerate the pain, but it has continued to worsen over the past two weeks. Patient states she has had ovarian cysts before, but states that this pain is different. It is described as a sharp pain. The pain began in her lower abdomen and back, but now she states that it is radiating up her R flank and into her R chest as a sharp shooting pain. Patient has not tried anything for the pain. She states that it is constant. Patient is unaware of her LMP but states she is currently bleeding a little bit, so she may be having it now. She is currently sexually active. Two weeks ago she began having cough, congestion, runny nose, chills and sweats, but did not take her temperature so she is unaware of whether or not she actually had a fever. These symptoms have continued to persist. She endorses shooting pain up her head occasionally. States she has been vomiting intermittently, and has associated nausea and decreased appetite, she endorses myalgias. She states that the pain causes her to be short of breath. She endorses lightheadedness and palpitations, and feels like her  heart "skips a beat".  She denies syncope, hearing changes, weight loss, rash, dysuria, urinary frequency, neck pain, hematochezia.  Past Medical History  Diagnosis Date  . 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 Acid reflux disease   . Bipolar disorder (HCC)   . ADHD (attention deficit hyperactivity disorder)   . Ovarian cyst   . Anxiety   . Depression    Past Surgical History  Procedure Laterality Date  . Leg surgery      when patient was 25 years old   History reviewed. No pertinent family history. Social History  Substance Use Topics  . Smoking status: Current Every Day Smoker -- 0.50 packs/day    Types: Cigarettes  . Smokeless tobacco: Never Used  . Alcohol Use: Yes     Comment: occasionally   OB History    No data available     Review of Systems  Gastrointestinal: Positive for abdominal pain.  Genitourinary: Positive for flank pain and pelvic pain.   All other systems negative except as documented in the HPI. All pertinent positives and negatives as reviewed in the HPI.   Allergies  Latex  Home Medications   Prior to Admission medications   Medication Sig Start Date End Date Taking? Authorizing Provider  metroNIDAZOLE (FLAGYL) 500 MG tablet Take 1 tablet (500 mg total) by mouth 2 (two) times daily. 11/14/14   Glynn Octave, MD  norgestimate-ethinyl estradiol (ORTHO-CYCLEN,SPRINTEC,PREVIFEM) 0.25-35 MG-MCG tablet Take 1 tablet by mouth daily. 08/15/14   Wilmer Floor  Leftwich-Kirby, CNM  omeprazole (PRILOSEC) 20 MG capsule Take 1 capsule (20 mg total) by mouth daily. 11/14/14   Glynn Octave, MD  ondansetron (ZOFRAN) 4 MG tablet Take 1 tablet (4 mg total) by mouth every 6 (six) hours. 11/14/14   Glynn Octave, MD   BP 107/60 mmHg  Pulse 74  Temp(Src) 98.9 F (37.2 C) (Oral)  Resp 20  Ht 5\' 7"  (1.702 m)  Wt 75.751 kg  BMI 26.15 kg/m2  SpO2 99%  LMP 06/18/2015 (Exact Date) Physical Exam  Constitutional: She appears well-developed and well-nourished. She appears  distressed (Patient laying comfortably upon entering, throughout interview she begins writhing on the bed, lifting up her back, moaning, constantly turning).  HENT:  Head: Normocephalic and atraumatic.  Right Ear: External ear normal.  Left Ear: External ear normal.  Eyes: Conjunctivae and EOM are normal. Pupils are equal, round, and reactive to light.  Neck: Normal range of motion. Neck supple.  Cardiovascular: Normal rate, regular rhythm and intact distal pulses.   Murmur heard. Pulmonary/Chest: Effort normal and breath sounds normal. No respiratory distress. She has no wheezes. She has no rales.  Abdominal: Soft. Bowel sounds are normal. She exhibits no distension. There is tenderness (Over epigastric and suprapubic region). There is no rebound, no guarding and no CVA tenderness.  Lymphadenopathy:    She has no cervical adenopathy.  Skin: Skin is warm and dry. No rash noted. No erythema.  Psychiatric:  Patient seems very anxious    ED Course  Procedures (including critical care time) Labs Review Labs Reviewed  COMPREHENSIVE METABOLIC PANEL - Abnormal; Notable for the following:    Glucose, Bld 100 (*)    Total Bilirubin <0.1 (*)    All other components within normal limits  URINALYSIS, ROUTINE W REFLEX MICROSCOPIC (NOT AT [facility]) - Abnormal; Notable for the following:    Color, Urine RED (*)    APPearance CLOUDY (*)    Hgb urine dipstick LARGE (*)    Bilirubin Urine MODERATE (*)    Ketones, ur 15 (*)    Protein, ur 100 (*)    Leukocytes, UA MODERATE (*)    All other components within normal limits  URINE MICROSCOPIC-ADD ON - Abnormal; Notable for the following:    Squamous Epithelial / LPF 6-30 (*)    Bacteria, UA MANY (*)    All other components within normal limits  URINE CULTURE  LIPASE, BLOOD  CBC  I-STAT BETA HCG BLOOD, ED ([facility], [facility], [facility] ONLY)    Imaging Review Ct Renal Stone Study  06/18/2015  CLINICAL DATA:  25 year old female with acute bilateral flank an  generalized abdominal pain, worse on the left side. Initial encounter. EXAM: CT ABDOMEN AND PELVIS WITHOUT CONTRAST TECHNIQUE: Multidetector CT imaging of the abdomen and pelvis was performed following the standard protocol without IV contrast. COMPARISON:  10/25/2014 CT Abdomen and Pelvis. Pelvis ultrasound 08/15/2014. FINDINGS: Lung bases remain normal.  No pericardial or pleural effusion. No acute osseous abnormality identified; incidental unfused bilateral inferior pubic rami ossification centers. No definite pelvic free fluid. Negative rectum. Noncontrast uterus and adnexa grossly normal; both add neck is are difficult to delineate. Mildly to moderately redundant and gas distended sigmoid colon, otherwise negative. Decompressed left colon. Negative transverse colon aside from retained stool. Retained stool in the right colon. Normal appendix (series 2, image 57). No dilated small bowel loops. Terminal ileum appears within normal limits. Negative non contrast stomach, duodenum, liver, gallbladder, spleen, pancreas and adrenal glands. No abdominal free air or  free fluid. No urologic calculus. No perinephric stranding or hydronephrosis. No proximal hydroureter. Numerous pelvic phleboliths are stable since 2016. No definite urologic calculus. No lymphadenopathy evident in the absence of contrast. IMPRESSION: Normal appendix. No urologic calculus. Negative noncontrast CT Abdomen and Pelvis. Electronically Signed   By: Odessa Fleming M.D.   On: 06/18/2015 08:10   I have personally reviewed and evaluated these images and lab results as part of my medical decision-making.  Patient's feeling better following pain medications.  We will have her follow-up with GYN for her lower flank abdominal pain.  Pain has been ongoing for several weeks is less likely to be torsion.  Told her that she most likely has a flulike illness.She has a cough,bodyaches,runny nose and chills.The patient is advised to return here as needed.Patient  agrees the plan and all questions were answered.   Charlestine Night, PA-C 06/18/15 1612  Linwood Dibbles, MD 06/18/15 [phone]

## 2015-06-18 NOTE — ED Notes (Signed)
Pt is eating and singing loudly.

## 2015-06-18 NOTE — ED Notes (Signed)
Patient here with complaint of abdominal pain, left flank pain, and left pelvic pain. Endorses loss of appetite. LMP: current. Hx: Ovarian cysts.

## 2015-06-19 LAB — URINE CULTURE

## 2015-06-27 ENCOUNTER — Telehealth (HOSPITAL_COMMUNITY): Payer: Self-pay

## 2015-06-27 ENCOUNTER — Encounter (HOSPITAL_COMMUNITY): Payer: Self-pay | Admitting: *Deleted

## 2015-06-27 ENCOUNTER — Emergency Department (HOSPITAL_COMMUNITY)
Admission: EM | Admit: 2015-06-27 | Discharge: 2015-06-27 | Disposition: A | Discharge status: Home / Self Care | Payer: Medicaid Other | Attending: Emergency Medicine | Admitting: Emergency Medicine

## 2015-06-27 DIAGNOSIS — R011 Cardiac murmur, unspecified: Secondary | ICD-10-CM | POA: Insufficient documentation

## 2015-06-27 DIAGNOSIS — R112 Nausea with vomiting, unspecified: Secondary | ICD-10-CM | POA: Insufficient documentation

## 2015-06-27 DIAGNOSIS — F17210 Nicotine dependence, cigarettes, uncomplicated: Secondary | ICD-10-CM | POA: Insufficient documentation

## 2015-06-27 DIAGNOSIS — R109 Unspecified abdominal pain: Secondary | ICD-10-CM | POA: Insufficient documentation

## 2015-06-27 LAB — COMPREHENSIVE METABOLIC PANEL
ALBUMIN: 4.2 g/dL (ref 3.5–5.0)
ALT: 17 U/L (ref 14–54)
ANION GAP: 8 (ref 5–15)
AST: 20 U/L (ref 15–41)
Alkaline Phosphatase: 48 U/L (ref 38–126)
BUN: 11 mg/dL (ref 6–20)
CHLORIDE: 105 mmol/L (ref 101–111)
CO2: 24 mmol/L (ref 22–32)
Calcium: 9.2 mg/dL (ref 8.9–10.3)
Creatinine, Ser: 0.81 mg/dL (ref 0.44–1.00)
GFR calc Af Amer: >60 mL/min (ref >60)
GFR calc non Af Amer: >60 mL/min (ref >60)
GLUCOSE: 101 mg/dL — AB (ref 65–99)
POTASSIUM: 3.7 mmol/L (ref 3.5–5.1)
SODIUM: 137 mmol/L (ref 135–145)
Total Bilirubin: 0.4 mg/dL (ref 0.3–1.2)
Total Protein: 7.1 g/dL (ref 6.5–8.1)

## 2015-06-27 LAB — CBC
HEMATOCRIT: 41.2 % (ref 36.0–46.0)
HEMOGLOBIN: 13.3 g/dL (ref 12.0–15.0)
MCH: 29.8 pg (ref 26.0–34.0)
MCHC: 32.3 g/dL (ref 30.0–36.0)
MCV: 92.2 fL (ref 78.0–100.0)
Platelets: 270 10*3/uL (ref 150–400)
RBC: 4.47 MIL/uL (ref 3.87–5.11)
RDW: 13.7 % (ref 11.5–15.5)
WBC: 8.7 10*3/uL (ref 4.0–10.5)

## 2015-06-27 LAB — I-STAT BETA HCG BLOOD, ED (MC, WL, AP ONLY): I-stat hCG, quantitative: <5 m[IU]/mL (ref <5)

## 2015-06-27 LAB — LIPASE, BLOOD: LIPASE: 31 U/L (ref 11–51)

## 2015-06-27 NOTE — ED Notes (Signed)
Per MD Fayrene FearingJames and MD Juleen ChinaKohut, pt is to be escorted off of the property. Pt's vitals are stable and pt has been yelling at each staff member that enters the lobby and yelling at each visitor that enters the lobby about the wait time and being incredibly disruptive to the ER environment.

## 2015-06-27 NOTE — Progress Notes (Signed)
Entered in d/c instructions  Massie MaroonLachina M Hollis, FNP Go on 07/15/2015 You have an appointment to establish care with Julianne HandlerLachina Hollis on 07/15/15 at 0930 [address] [place] [place] [address] [phone]

## 2015-06-27 NOTE — Telephone Encounter (Signed)
Spoke with [facility] charge nurse- stated it was okay to give test results since they were within normal limits. Pt advised of results.

## 2015-06-27 NOTE — ED Notes (Signed)
Pt demanding to be given pain medication in the lobby. Pt reported to triage nurse Crawford Givensowd that she believes she might be pregnant. Explained to pt that we cannot give her pain medication until the doctor sees her because there is the chance that she may be pregnant. Pt beginning to yell in the lobby and cuss at this RN, demanding to see a supervisor. Explained to this pt that our supervisor and other administrators are either out of town or no longer in the office. [facility] just down here and pt is now angry and demanding that she was lied to by this RN. Explained the process to pt and advised security that if pt continues to yell and cuss, she may be taken out of the lobby.

## 2015-06-27 NOTE — ED Notes (Signed)
C/o abd pain, ? Pregnant, was recently hospitalized for same complaint, nausea and vomiting, no appetitie

## 2015-07-15 ENCOUNTER — Ambulatory Visit: Payer: Medicaid Other | Admitting: Family Medicine

## 2015-08-11 ENCOUNTER — Emergency Department (HOSPITAL_BASED_OUTPATIENT_CLINIC_OR_DEPARTMENT_OTHER): Payer: Medicaid Other

## 2015-08-11 ENCOUNTER — Emergency Department (HOSPITAL_BASED_OUTPATIENT_CLINIC_OR_DEPARTMENT_OTHER)
Admission: EM | Admit: 2015-08-11 | Discharge: 2015-08-11 | Disposition: A | Discharge status: Home / Self Care | Payer: Medicaid Other | Attending: Emergency Medicine | Admitting: Emergency Medicine

## 2015-08-11 ENCOUNTER — Encounter (HOSPITAL_BASED_OUTPATIENT_CLINIC_OR_DEPARTMENT_OTHER): Payer: Self-pay

## 2015-08-11 DIAGNOSIS — R109 Unspecified abdominal pain: Secondary | ICD-10-CM | POA: Insufficient documentation

## 2015-08-11 DIAGNOSIS — Z91040 Latex allergy status: Secondary | ICD-10-CM | POA: Insufficient documentation

## 2015-08-11 DIAGNOSIS — Z8719 Personal history of other diseases of the digestive system: Secondary | ICD-10-CM | POA: Insufficient documentation

## 2015-08-11 DIAGNOSIS — O9989 Other specified diseases and conditions complicating pregnancy, childbirth and the puerperium: Secondary | ICD-10-CM | POA: Insufficient documentation

## 2015-08-11 DIAGNOSIS — O99331 Smoking (tobacco) complicating pregnancy, first trimester: Secondary | ICD-10-CM | POA: Diagnosis not present

## 2015-08-11 DIAGNOSIS — Z793 Long term (current) use of hormonal contraceptives: Secondary | ICD-10-CM | POA: Diagnosis not present

## 2015-08-11 DIAGNOSIS — R011 Cardiac murmur, unspecified: Secondary | ICD-10-CM | POA: Insufficient documentation

## 2015-08-11 DIAGNOSIS — R42 Dizziness and giddiness: Secondary | ICD-10-CM | POA: Insufficient documentation

## 2015-08-11 DIAGNOSIS — R200 Anesthesia of skin: Secondary | ICD-10-CM | POA: Diagnosis not present

## 2015-08-11 DIAGNOSIS — Z3A01 Less than 8 weeks gestation of pregnancy: Secondary | ICD-10-CM | POA: Insufficient documentation

## 2015-08-11 DIAGNOSIS — F17210 Nicotine dependence, cigarettes, uncomplicated: Secondary | ICD-10-CM | POA: Diagnosis not present

## 2015-08-11 DIAGNOSIS — O26899 Other specified pregnancy related conditions, unspecified trimester: Secondary | ICD-10-CM

## 2015-08-11 DIAGNOSIS — R630 Anorexia: Secondary | ICD-10-CM | POA: Insufficient documentation

## 2015-08-11 DIAGNOSIS — Z8659 Personal history of other mental and behavioral disorders: Secondary | ICD-10-CM | POA: Insufficient documentation

## 2015-08-11 HISTORY — DX: Malingerer (conscious simulation): Z76.5

## 2015-08-11 LAB — URINE MICROSCOPIC-ADD ON

## 2015-08-11 LAB — URINALYSIS, ROUTINE W REFLEX MICROSCOPIC
Bilirubin Urine: NEGATIVE
Glucose, UA: NEGATIVE mg/dL
Hgb urine dipstick: NEGATIVE
Ketones, ur: NEGATIVE mg/dL
Nitrite: NEGATIVE
PROTEIN: NEGATIVE mg/dL
SPECIFIC GRAVITY, URINE: 1.021 (ref 1.005–1.030)
pH: 6 (ref 5.0–8.0)

## 2015-08-11 LAB — PREGNANCY, URINE: PREG TEST UR: POSITIVE — AB

## 2015-08-11 LAB — HCG, QUANTITATIVE, PREGNANCY: hCG, Beta Chain, Quant, S: 98547 m[IU]/mL — ABNORMAL HIGH (ref <5)

## 2015-08-11 MED ORDER — PRENATAL VITAMINS 0.8 MG PO TABS: 1.0000 | ORAL_TABLET | Freq: Every day | ORAL | Status: DC

## 2015-08-11 NOTE — ED Notes (Signed)
Patient ambulated to US

## 2015-08-11 NOTE — Discharge Instructions (Signed)
Return to the ED with any concerns including abdominal pain, vaginal bleeding, vaginal discharge, fever/chills, vomiting and not able to keep down liquids, decreased level of alertness/lethargy, or any other alarming symptoms

## 2015-08-11 NOTE — ED Notes (Signed)
Pt reports unsure of last menstruations, has been having nausea.  Reports took pregnancy test today and would like to know if she is pregnant.

## 2015-08-11 NOTE — ED Provider Notes (Signed)
CSN: [id number]     Arrival date & time 08/11/15  1349 History  By signing my name below, I, Linus Galas, attest that this documentation has been prepared under the direction and in the presence of No att. providers found. Electronically Signed: Linus Galas, ED Scribe. 08/12/2015. 3:14 PM.    Chief Complaint  Patient presents with  . Nausea   The history is provided by the patient. No language interpreter was used.   HPI Comments: Cynthia Kent is a 25 y.o. female who presents to the Emergency Department with a PMHx of ovarian cysts complaining of lower abdominal pain that began 2 weeks ago. Pt also reports arm numbness, lightheadedness, and decreased appetite with nausea and breast pain. Pt states she tested positive for her first pregnancy at-home and is requesting for an ultrasound to be performed. Pt denies any fevers, vaginal bleeding, or any other symptoms at this time. She has never been pregnant before.   Pt LMP was "3 months ago."  Past Medical History  Diagnosis Date  . 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 Acid reflux disease   . Bipolar disorder (HCC)   . ADHD (attention deficit hyperactivity disorder)   . Ovarian cyst   . Anxiety   . Depression   . Drug-seeking behavior    Past Surgical History  Procedure Laterality Date  . Leg surgery      when patient was 25 years old   No family history on file. Social History  Substance Use Topics  . Smoking status: Current Every Day Smoker -- 2.00 packs/day    Types: Cigarettes  . Smokeless tobacco: Never Used  . Alcohol Use: Yes     Comment: occasionally   OB History    No data available     Review of Systems  Constitutional: Positive for appetite change (decreased). Negative for fever.  Gastrointestinal: Positive for abdominal pain.  Genitourinary: Negative for vaginal bleeding.  Neurological: Positive for light-headedness and numbness.  All other systems reviewed and are negative.  Allergies   Hydrocodone and Latex  Home Medications   Prior to Admission medications   Medication Sig Start Date End Date Taking? Authorizing Provider  HYDROcodone-acetaminophen (NORCO/VICODIN) 5-325 MG tablet Take 1 tablet by mouth every 6 (six) hours as needed for moderate pain. Patient not taking: Reported on 06/27/2015 06/18/15   Charlestine Night, PA-C  ibuprofen (ADVIL,MOTRIN) 800 MG tablet Take 1 tablet (800 mg total) by mouth every 8 (eight) hours as needed. Patient not taking: Reported on 06/27/2015 06/18/15   Charlestine Night, PA-C  norgestimate-ethinyl estradiol (ORTHO-CYCLEN,SPRINTEC,PREVIFEM) 0.25-35 MG-MCG tablet Take 1 tablet by mouth daily. Patient not taking: Reported on 06/18/2015 08/15/14   Wilmer Floor Leftwich-Kirby, CNM  omeprazole (PRILOSEC) 20 MG capsule Take 1 capsule (20 mg total) by mouth daily. Patient not taking: Reported on 06/18/2015 11/14/14   Glynn Octave, MD  ondansetron (ZOFRAN) 4 MG tablet Take 1 tablet (4 mg total) by mouth every 6 (six) hours. Patient not taking: Reported on 06/18/2015 11/14/14   Glynn Octave, MD  Prenatal Multivit-Min-Fe-FA (PRENATAL VITAMINS) 0.8 MG tablet Take 1 tablet by mouth daily. 08/11/15   Jerelyn Scott, MD  promethazine (PHENERGAN) 25 MG tablet Take 1 tablet (25 mg total) by mouth every 8 (eight) hours as needed for nausea or vomiting. Patient not taking: Reported on 06/27/2015 06/18/15   Charlestine Night, PA-C   BP 139/98 mmHg  Pulse 102  Temp(Src) 99.2 F (37.3 C) (Oral)  Resp 15  Ht   (1.676 m)  Wt 162 lb (73.483 kg)  BMI 26.16 kg/m2  SpO2 100%  LMP  (LMP Unknown)  Vitals reviewed Physical Exam Physical Examination: General appearance - alert, well appearing, and in no distress Mental status - alert, oriented to person, place, and time Eyes - no conjunctival injection, no scleral icterus Mouth - mucous membranes moist, pharynx normal without lesions Chest - clear to auscultation, no wheezes, rales or rhonchi, symmetric air  entry Heart - normal rate, regular rhythm, normal S1, S2, no murmurs, rubs, clicks or gallops Abdomen - soft, nontender, nondistended, no masses or organomegaly Neurological - alert, oriented, normal speech Extremities - peripheral pulses normal, no pedal edema, no clubbing or cyanosis Skin - normal coloration and turgor, no rashes Psych- pt with pressured speech, labile ED Course  Procedures   DIAGNOSTIC STUDIES: Oxygen Saturation is 100% on room air, normal by my interpretation.    COORDINATION OF CARE: 3:06 PM Will order [place]. Will order pregnancy screen, urinalysis, and urine culture. Discussed treatment plan with pt at bedside and pt agreed to plan.   Labs Review Labs Reviewed  URINALYSIS, ROUTINE W REFLEX MICROSCOPIC (NOT AT [facility]) - Abnormal; Notable for the following:    APPearance CLOUDY (*)    Leukocytes, UA MODERATE (*)    All other components within normal limits  PREGNANCY, URINE - Abnormal; Notable for the following:    Preg Test, Ur POSITIVE (*)    All other components within normal limits  URINE MICROSCOPIC-ADD ON - Abnormal; Notable for the following:    Squamous Epithelial / LPF 6-30 (*)    Bacteria, UA MANY (*)    All other components within normal limits  HCG, QUANTITATIVE, PREGNANCY - Abnormal; Notable for the following:    hCG, Beta Chain, Quant, S [id number] (*)    All other components within normal limits    Imaging Review US Ob Comp Less 14 Wks  08/11/2015  CLINICAL DATA:  Known pelvic pain for 2 weeks, positive pregnancy test EXAM: OBSTETRIC <14 WK [place] AND TRANSVAGINAL OB US TECHNIQUE: Both transabdominal and transvaginal ultrasound examinations were performed for complete evaluation of the gestation as well as the maternal uterus, adnexal regions, and pelvic cul-de-sac. Transvaginal technique was performed to assess early pregnancy. COMPARISON:  None. FINDINGS: Intrauterine gestational sac: Single and present Yolk sac:  Present Embryo:  Present Cardiac Activity:  Present Heart Rate: 150  bpm CRL:  12  mm   7 w   3 d                  [place] EDC: 05/02/2010 Subchorionic hemorrhage:  None visualized. Maternal uterus/adnexae: The ovaries are not well visualized. No uterine abnormality is noted IMPRESSION: Single live intrauterine gestation at 7 weeks 3 days Electronically Signed   By: Alcide Clever M.D.   On: 08/11/2015 16:06   US Ob Transvaginal  08/11/2015  CLINICAL DATA:  Known pelvic pain for 2 weeks, positive pregnancy test EXAM: OBSTETRIC <14 WK [place] AND TRANSVAGINAL OB US TECHNIQUE: Both transabdominal and transvaginal ultrasound examinations were performed for complete evaluation of the gestation as well as the maternal uterus, adnexal regions, and pelvic cul-de-sac. Transvaginal technique was performed to assess early pregnancy. COMPARISON:  None. FINDINGS: Intrauterine gestational sac: Single and present Yolk sac:  Present Embryo:  Present Cardiac Activity: Present Heart Rate: 150  bpm CRL:  12  mm   7 w   3 d  US EDC: 05/02/2010 Subchorionic hemorrhage:  None visualized. Maternal uterus/adnexae: The ovaries are not well visualized. No uterine abnormality is noted IMPRESSION: Single live intrauterine gestation at 7 weeks 3 days Electronically Signed   By: Alcide CleverMark  Lukens M.D.   On: 08/11/2015 16:06   I have personally reviewed and evaluated these images and lab results as part of my medical decision-making.   EKG Interpretation None      MDM   Final diagnoses:  Abdominal pain in pregnancy    Pt presenting with c/o requesting a pregnancy test and an ultrasound.  She initially denied abdominal pain but after discussion of reasons for ultrasound in early pregnancy she began to say that she has lower abdominal pain that she has had for months.  She states that every time she comes here they do an ultrasound.  She has no abdominal tenderness on exam- but continues to c/o pain- so will proceed with ultrasound to r/o ectopic pregnancy.  IUP at 7 weeks  present.  HCG is 90K.   Pt is not agreeable with pelvic exam. She was given rx for prenatal vitamins and information for OB followup.   Discharged with strict return precautions.  Pt agreeable with plan.  She has no vomiting and is tolerating po fluids in the ED.   I personally performed the services described in this documentation, which was scribed in my presence. The recorded information has been reviewed and is accurate.     Jerelyn ScottMartha Linker, MD 08/12/15 [phone]

## 2015-10-30 ENCOUNTER — Encounter (HOSPITAL_BASED_OUTPATIENT_CLINIC_OR_DEPARTMENT_OTHER): Payer: Self-pay | Admitting: *Deleted

## 2015-10-30 ENCOUNTER — Emergency Department (HOSPITAL_BASED_OUTPATIENT_CLINIC_OR_DEPARTMENT_OTHER)
Admission: EM | Admit: 2015-10-30 | Discharge: 2015-10-31 | Disposition: A | Discharge status: Home / Self Care | Payer: Medicaid Other | Attending: Emergency Medicine | Admitting: Emergency Medicine

## 2015-10-30 DIAGNOSIS — O23592 Infection of other part of genital tract in pregnancy, second trimester: Secondary | ICD-10-CM | POA: Diagnosis not present

## 2015-10-30 DIAGNOSIS — O2342 Unspecified infection of urinary tract in pregnancy, second trimester: Secondary | ICD-10-CM | POA: Diagnosis not present

## 2015-10-30 DIAGNOSIS — F319 Bipolar disorder, unspecified: Secondary | ICD-10-CM | POA: Diagnosis not present

## 2015-10-30 DIAGNOSIS — F17210 Nicotine dependence, cigarettes, uncomplicated: Secondary | ICD-10-CM | POA: Diagnosis not present

## 2015-10-30 DIAGNOSIS — Z3A19 19 weeks gestation of pregnancy: Secondary | ICD-10-CM | POA: Diagnosis not present

## 2015-10-30 DIAGNOSIS — B3731 Acute candidiasis of vulva and vagina: Secondary | ICD-10-CM

## 2015-10-30 DIAGNOSIS — B373 Candidiasis of vulva and vagina: Secondary | ICD-10-CM | POA: Diagnosis not present

## 2015-10-30 LAB — WET PREP, GENITAL
CLUE CELLS WET PREP: NONE SEEN
Sperm: NONE SEEN
Trich, Wet Prep: NONE SEEN

## 2015-10-30 LAB — URINE MICROSCOPIC-ADD ON

## 2015-10-30 LAB — URINALYSIS, ROUTINE W REFLEX MICROSCOPIC
Bilirubin Urine: NEGATIVE
GLUCOSE, UA: NEGATIVE mg/dL
KETONES UR: NEGATIVE mg/dL
Nitrite: NEGATIVE
PROTEIN: NEGATIVE mg/dL
Specific Gravity, Urine: 1.02 (ref 1.005–1.030)
pH: 6 (ref 5.0–8.0)

## 2015-10-30 NOTE — ED Notes (Signed)
MD at bedside. 

## 2015-10-30 NOTE — ED Provider Notes (Signed)
CSN: [id number]     Arrival date & time 10/30/15  2205 History   By signing my name below, I, Suzan SlickAshley N. Elon SpannerLeger, attest that this documentation has been prepared under the direction and in the presence of Paula LibraJohn Amando Chaput, MD.  Electronically Signed: Suzan SlickAshley N. Elon SpannerLeger, ED Scribe. 10/30/2015. 11:35 PM.   Chief Complaint  Patient presents with  . Vaginal Discharge   HPI  HPI Comments: Cynthia Kent, currently [redacted] weeks gestation is a 25 y.o. female without any pertinent past medical history who presents to the Emergency Department complaining of constant vaginal itching/discharge with associated swelling x 3 days. She also reports lower abdominal pain. No specific aggravating or alleviating factors reported. Denies any recent fever, chills, vomiting, or vaginal bleeding. Pt was last seen on 6/28 by her OB/GYN.  PCP: Massie MaroonHollis,Lachina M, FNP  [facility]  Past Medical History  Diagnosis Date  . 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 Acid reflux disease   . Bipolar disorder (HCC)   . ADHD (attention deficit hyperactivity disorder)   . Ovarian cyst   . Anxiety   . Depression   . Drug-seeking behavior    Past Surgical History  Procedure Laterality Date  . Leg surgery      when patient was 25 years old   History reviewed. No pertinent family history. Social History  Substance Use Topics  . Smoking status: Current Every Day Smoker -- 2.00 packs/day    Types: Cigarettes  . Smokeless tobacco: Never Used  . Alcohol Use: Yes     Comment: occasionally   OB History    Gravida Para Term Preterm AB TAB SAB Ectopic Multiple Living   1              Review of Systems  All other systems reviewed and are negative.   Allergies  Hydrocodone and Latex  Home Medications   Prior to Admission medications   Medication Sig Start Date End Date Taking? Authorizing Provider  HYDROcodone-acetaminophen (NORCO/VICODIN) 5-325 MG tablet Take 1 tablet by mouth every 6 (six) hours as needed  for moderate pain. Patient not taking: Reported on 06/27/2015 06/18/15   Charlestine Nighthristopher Lawyer, PA-C  ibuprofen (ADVIL,MOTRIN) 800 MG tablet Take 1 tablet (800 mg total) by mouth every 8 (eight) hours as needed. Patient not taking: Reported on 06/27/2015 06/18/15   Charlestine Nighthristopher Lawyer, PA-C  norgestimate-ethinyl estradiol (ORTHO-CYCLEN,SPRINTEC,PREVIFEM) 0.25-35 MG-MCG tablet Take 1 tablet by mouth daily. Patient not taking: Reported on 06/18/2015 08/15/14   Wilmer FloorLisa A Leftwich-Kirby, CNM  omeprazole (PRILOSEC) 20 MG capsule Take 1 capsule (20 mg total) by mouth daily. Patient not taking: Reported on 06/18/2015 11/14/14   Glynn OctaveStephen Rancour, MD  ondansetron (ZOFRAN) 4 MG tablet Take 1 tablet (4 mg total) by mouth every 6 (six) hours. Patient not taking: Reported on 06/18/2015 11/14/14   Glynn OctaveStephen Rancour, MD  Prenatal Multivit-Min-Fe-FA (PRENATAL VITAMINS) 0.8 MG tablet Take 1 tablet by mouth daily. 08/11/15   Jerelyn ScottMartha Linker, MD  promethazine (PHENERGAN) 25 MG tablet Take 1 tablet (25 mg total) by mouth every 8 (eight) hours as needed for nausea or vomiting. Patient not taking: Reported on 06/27/2015 06/18/15   Charlestine Nighthristopher Lawyer, PA-C   Triage Vitals: BP 120/80 mmHg  Pulse 110  Temp(Src) 98.6 F (37 C) (Oral)  Resp 18  Ht 5\' 6"  (1.676 m)  Wt 187 lb (84.823 kg)  BMI 30.20 kg/m2  SpO2 100%  LMP  (LMP Unknown)   Physical Exam General: Well-developed,  well-nourished female in no acute distress; appearance consistent with age of record HENT: normocephalic; atraumatic Eyes: pupils equal, round and reactive to light; extraocular muscles intact Neck: supple Heart: regular rate and rhythm Lungs: clear to auscultation bilaterally Abdomen: soft; bowel sounds present; gravid, consistent with dates; suprapubic tenderness GU: Swollen, inflamed vulva; copious curd-like greenish white vaginal discharge Extremities: No deformity; full range of motion; pulses normal Neurologic: Awake, alert and oriented; motor function intact  in all extremities and symmetric; no facial droop Skin: Warm and dry Psychiatric: Normal mood and affect    ED Course  Procedures (including critical care time)   MDM   Nursing notes and vitals signs, including pulse oximetry, reviewed.  Summary of this visit's results, reviewed by myself:  Labs:  Results for orders placed or performed during the hospital encounter of 10/30/15 (from the past 24 hour(s))  Urinalysis, Routine w reflex microscopic (not at [facility])     Status: Abnormal   Collection Time: 10/30/15 10:10 PM  Result Value Ref Range   Color, Urine YELLOW YELLOW   APPearance CLOUDY (A) CLEAR   Specific Gravity, Urine 1.020 1.005 - 1.030   pH 6.0 5.0 - 8.0   Glucose, UA NEGATIVE NEGATIVE mg/dL   Hgb urine dipstick TRACE (A) NEGATIVE   Bilirubin Urine NEGATIVE NEGATIVE   Ketones, ur NEGATIVE NEGATIVE mg/dL   Protein, ur NEGATIVE NEGATIVE mg/dL   Nitrite NEGATIVE NEGATIVE   Leukocytes, UA LARGE (A) NEGATIVE  Urine microscopic-add on     Status: Abnormal   Collection Time: 10/30/15 10:10 PM  Result Value Ref Range   Squamous Epithelial / LPF 6-30 (A) NONE SEEN   WBC, UA TOO NUMEROUS TO COUNT 0 - 5 WBC/hpf   RBC / HPF 6-30 0 - 5 RBC/hpf   Bacteria, UA MANY (A) NONE SEEN  Wet prep, genital     Status: Abnormal   Collection Time: 10/30/15 11:40 PM  Result Value Ref Range   Yeast Wet Prep HPF POC PRESENT (A) NONE SEEN   Trich, Wet Prep NONE SEEN NONE SEEN   Clue Cells Wet Prep HPF POC NONE SEEN NONE SEEN   WBC, Wet Prep HPF POC MANY (A) NONE SEEN   Sperm NONE SEEN      Final diagnoses:  Candidal vulvovaginitis  UTI (urinary tract infection) during pregnancy, second trimester   I personally performed the services described in this documentation, which was scribed in my presence. The recorded information has been reviewed and is accurate.   Paula LibraJohn Tericka Devincenzi, MD 10/31/15 [phone]

## 2015-10-30 NOTE — ED Notes (Addendum)
Pt c/o vaginal discharge x 3 days also c/o lower back pain and lower abd pain and is 19 weeks preg

## 2015-10-31 LAB — GC/CHLAMYDIA PROBE AMP (~~LOC~~) NOT AT ARMC
CHLAMYDIA, DNA PROBE: NEGATIVE
NEISSERIA GONORRHEA: NEGATIVE

## 2015-10-31 MED ORDER — NITROFURANTOIN MONOHYD MACRO 100 MG PO CAPS
100.0000 mg | ORAL_CAPSULE | Freq: Once | ORAL | Status: AC
  Administered 2015-10-31: 100 mg via ORAL
  Filled 2015-10-31: qty 1

## 2015-10-31 MED ORDER — NITROFURANTOIN MONOHYD MACRO 100 MG PO CAPS: 100.0000 mg | ORAL_CAPSULE | Freq: Two times a day (BID) | ORAL | Status: DC

## 2015-10-31 MED ORDER — CLOTRIMAZOLE 1 % VA CREA: 1.0000 | TOPICAL_CREAM | Freq: Every day | VAGINAL | Status: DC

## 2015-11-01 LAB — URINE CULTURE

## 2015-11-13 ENCOUNTER — Encounter: Payer: Self-pay | Admitting: Obstetrics and Gynecology

## 2015-11-17 ENCOUNTER — Encounter: Payer: Self-pay | Admitting: Obstetrics and Gynecology

## 2015-11-17 ENCOUNTER — Ambulatory Visit (INDEPENDENT_AMBULATORY_CARE_PROVIDER_SITE_OTHER): Payer: Medicaid Other | Admitting: Obstetrics and Gynecology

## 2015-11-17 VITALS — BP 103/64 | HR 79 | Wt 188.0 lb

## 2015-11-17 DIAGNOSIS — Z3400 Encounter for supervision of normal first pregnancy, unspecified trimester: Secondary | ICD-10-CM | POA: Insufficient documentation

## 2015-11-17 DIAGNOSIS — O26892 Other specified pregnancy related conditions, second trimester: Secondary | ICD-10-CM | POA: Diagnosis not present

## 2015-11-17 DIAGNOSIS — Z87898 Personal history of other specified conditions: Secondary | ICD-10-CM

## 2015-11-17 DIAGNOSIS — Z8619 Personal history of other infectious and parasitic diseases: Secondary | ICD-10-CM | POA: Insufficient documentation

## 2015-11-17 DIAGNOSIS — R0683 Snoring: Secondary | ICD-10-CM | POA: Insufficient documentation

## 2015-11-17 DIAGNOSIS — N898 Other specified noninflammatory disorders of vagina: Secondary | ICD-10-CM | POA: Diagnosis not present

## 2015-11-17 DIAGNOSIS — Z3482 Encounter for supervision of other normal pregnancy, second trimester: Secondary | ICD-10-CM | POA: Diagnosis not present

## 2015-11-17 DIAGNOSIS — F1291 Cannabis use, unspecified, in remission: Secondary | ICD-10-CM

## 2015-11-17 DIAGNOSIS — Z3492 Encounter for supervision of normal pregnancy, unspecified, second trimester: Secondary | ICD-10-CM

## 2015-11-17 NOTE — Progress Notes (Signed)
Prenatal Visit Note Transfer from [facility] Date: 11/17/2015 [facility]: [facility]  Subjective:  Cynthia Kent is a 25 y.o. G1P0 at 108w6d being seen today for ongoing prenatal care.  She is currently monitored for the following issues for this low-risk pregnancy and has Supervision of normal pregnancy in second trimester; Snoring; and History of trichomoniasis on her problem list.  Patient reports continued vaginal discharge which has been present throughout the pregnancy.   Contractions: Not present. Vag. Bleeding: None.  Movement: Present. Denies leaking of fluid.   The following portions of the patient's history were reviewed and updated as appropriate: allergies, current medications, past family history, past medical history, past social history, past surgical history and problem list. Problem list updated.  Objective:   Vitals:   11/17/15 1353  BP: 103/64  Pulse: 79  Weight: 188 lb (85.3 kg)    Fetal Status:     Movement: Present     General:  Alert, oriented and cooperative. Patient is in no acute distress.  Skin: Skin is warm and dry. No rash noted.   Cardiovascular: Normal heart rate noted  Respiratory: Normal respiratory effort, no problems with respiration noted  Abdomen: Soft, gravid, appropriate for gestational age. Pain/Pressure: Absent     Pelvic:  EGBUS with white clumpy material on it  Extremities: Normal range of motion.  Edema: None  Mental Status: Normal mood and affect. Normal behavior. Normal judgment and thought content.   Urinalysis: Urine Protein: Negative Urine Glucose: Negative  Assessment and Plan:  Pregnancy: G1P0 at [redacted]w[redacted]d  1. History of trichomoniasis TOC today. Was treated earlier in pregnancy - Chlamydia/Gonococcus/Trichomonas, NAA  2. Snoring Patient states one of the reasons she left was because of lack of them setting this up. She was told by a friend that when she sleeps she "sounds funny." - Ambulatory referral to Sleep Studies  3.  Supervision of normal pregnancy in second trimester Incomplete anatomy scan at [facility]. 3wk rpt with MFM ordered. Patient told that vaginal d/c during pregnancy is normally especially chronic yeast infections (seen on pap) F/u trich toc and consider yeast culture if persists. Pt advised to do repeat course of monistat 7 today.  O pos/RNI/rpr neg/hiv neg/hepB neg/GC-CT neg/Ucx neg/cbc, BMP, [place] testing negative - [place] MFM OB COMP + 14 WK; Future  Preterm labor symptoms and general obstetric precautions including but not limited to vaginal bleeding, contractions, leaking of fluid and fetal movement were reviewed in detail with the patient. Please refer to After Visit Summary for other counseling recommendations.  3-4wk MFM anatomy u/s 3-4wk ROB  Chowan Bing, MD

## 2015-11-18 LAB — CHLAMYDIA/GONOCOCCUS/TRICHOMONAS, NAA
Chlamydia by NAA: NEGATIVE
GONOCOCCUS BY NAA: NEGATIVE
TRICH VAG BY NAA: NEGATIVE

## 2015-11-24 ENCOUNTER — Encounter (HOSPITAL_COMMUNITY): Payer: Self-pay | Admitting: Obstetrics and Gynecology

## 2015-12-01 ENCOUNTER — Ambulatory Visit (HOSPITAL_COMMUNITY)
Admission: RE | Admit: 2015-12-01 | Discharge: 2015-12-01 | Disposition: A | Discharge status: Home / Self Care | Payer: Medicaid Other | Source: Ambulatory Visit | Attending: Obstetrics and Gynecology | Admitting: Obstetrics and Gynecology

## 2015-12-01 DIAGNOSIS — Z3492 Encounter for supervision of normal pregnancy, unspecified, second trimester: Secondary | ICD-10-CM

## 2015-12-01 DIAGNOSIS — Z3482 Encounter for supervision of other normal pregnancy, second trimester: Secondary | ICD-10-CM | POA: Diagnosis present

## 2015-12-01 DIAGNOSIS — Z3A23 23 weeks gestation of pregnancy: Secondary | ICD-10-CM | POA: Insufficient documentation

## 2015-12-02 ENCOUNTER — Encounter: Payer: Self-pay | Admitting: Neurology

## 2015-12-02 ENCOUNTER — Ambulatory Visit (INDEPENDENT_AMBULATORY_CARE_PROVIDER_SITE_OTHER): Payer: Medicaid Other | Admitting: Neurology

## 2015-12-02 VITALS — BP 118/62 | HR 80 | Resp 16 | Ht 66.0 in | Wt 185.0 lb

## 2015-12-02 DIAGNOSIS — R519 Headache, unspecified: Secondary | ICD-10-CM

## 2015-12-02 DIAGNOSIS — G2581 Restless legs syndrome: Secondary | ICD-10-CM

## 2015-12-02 DIAGNOSIS — R0683 Snoring: Secondary | ICD-10-CM

## 2015-12-02 DIAGNOSIS — R0681 Apnea, not elsewhere classified: Secondary | ICD-10-CM | POA: Diagnosis not present

## 2015-12-02 DIAGNOSIS — G4710 Hypersomnia, unspecified: Secondary | ICD-10-CM

## 2015-12-02 DIAGNOSIS — R51 Headache: Secondary | ICD-10-CM

## 2015-12-02 DIAGNOSIS — Z3490 Encounter for supervision of normal pregnancy, unspecified, unspecified trimester: Secondary | ICD-10-CM

## 2015-12-02 NOTE — Progress Notes (Signed)
Subjective:    Patient ID: Cynthia Kent is a 25 y.o. female.  HPI     Huston FoleySaima Demia Viera, MD, PhD [facility] [address], [address] [address] [place], [place] [id number]  Dear Dr. Vergie LivingPickens,   I saw your patient, Cynthia Kent, upon your kind request in my neurologic clinic today for initial consultation of her sleep disorder, in particular, concern for underlying obstructive sleep apnea. The patient is accompanied by her mother today. As you know, Ms. Kent is a 25 year old right-handed woman with an underlying medical history of mood disorder, reflux disease, currently pregnant in the second trimester, who reports snoring and excessive daytime somnolence. Her Epworth sleepiness score is 16 out of 24 today, her fatigue score is 57 out of 63. Mom has noted apneic pauses. She does not have a set sleep schedule. She does not work. She lives with mom. She goes to bed late, sometimes 2 AM oh 3 AM. Wakeup time diuresis well. She does not wake up rested. She is not a good sleeper and has not been a good sleeper. She has occasional morning headaches. She has occasional restless leg symptoms, mother also has restless leg syndrome. Mom was diagnosed with sleep apnea as well. Of note, the patient drinks caffeine occasionally and smokes occasionally. She is advised about the importance of complete smoking cessation. She does not drink any alcohol and denies using any illicit drugs. I reviewed your office note from 11/17/2015.  Her Past Medical History Is Significant For: Past Medical History:  Diagnosis Date  . Acid reflux disease   . ADHD (attention deficit hyperactivity disorder)   . Anxiety   . Bipolar disorder (HCC)   . Depression   . Drug-seeking behavior   . 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 Ovarian cyst   . Sleep apnea     Her Past Surgical History Is Significant For: Past Surgical History:  Procedure Laterality Date  . LEG SURGERY     when  patient was 25 years old    Her Family History Is Significant For: No family history on file.  Her Social History Is Significant For: Social History   Social History  . Marital status: Single    Spouse name: N/A  . Number of children: N/A  . Years of education: HS   Social History Main Topics  . Smoking status: Light Tobacco Smoker    Types: Cigarettes  . Smokeless tobacco: Never Used     Comment: Currently will take "a puff"   . Alcohol use No     Comment: not since pregnancy   . Drug use: No  . Sexual activity: Yes    Birth control/ protection: None   Other Topics Concern  . None   Social History Narrative   Drinks ginger ale     Her Allergies Are:  Allergies  Allergen Reactions  . Amoxicillin Hives  . Hydrocodone Itching  . Latex Swelling    Irritates her skin   :   Her Current Medications Are:  Outpatient Encounter Prescriptions as of 12/02/2015  Medication Sig  . clotrimazole (CLOTRIMAZOLE-7) 1 % vaginal cream Place 1 Applicatorful vaginally at bedtime.  . Prenatal Multivit-Min-Fe-FA (PRENATAL VITAMINS) 0.8 MG tablet Take 1 tablet by mouth daily.  . [DISCONTINUED] nitrofurantoin, macrocrystal-monohydrate, (MACROBID) 100 MG capsule Take 1 capsule (100 mg total) by mouth 2 (two) times daily. X 7 days (Patient not taking: Reported on 11/17/2015)   No facility-administered encounter medications on file  as of 12/02/2015.   :  Review of Systems:  Out of a complete 14 point review of systems, all are reviewed and negative with the exception of these symptoms as listed below:  Review of Systems  Neurological:       Patient states that she was diagnosed with sleep apnea as a kid. No sleep study completed.  Patient has trouble falling and staying asleep, reports that she was prescribed a sleep aid as a child, wakes up gasping for air, witnessed apnea, snoring, wakes up feeling tired, daytime tiredness, morning headaches, takes naps.    Epworth Sleepiness Scale 0=  would never doze 1= slight chance of dozing 2= moderate chance of dozing 3= high chance of dozing  Sitting and reading:2 Watching TV:3 Sitting inactive in a public place (ex. Theater or meeting):2 As a passenger in a car for an hour without a break:1 Lying down to rest in the afternoon:2 Sitting and talking to someone:1 Sitting quietly after lunch (no alcohol):3 In a car, while stopped in traffic:2 Total:16   Objective:  Neurologic Exam  Physical Exam Physical Examination:   Vitals:   12/02/15 1606  BP: 118/62  Pulse: 80  Resp: 16    General Examination: The patient is a very pleasant 26 y.o. female in no acute distress. She appears well-developed and well-nourished and adequately groomed.   HEENT: Normocephalic, atraumatic, pupils are equal, round and reactive to light and accommodation. Funduscopic exam is normal with sharp disc margins noted. Extraocular tracking is good without limitation to gaze excursion or nystagmus noted. Normal smooth pursuit is noted. Hearing is grossly intact. Tympanic membranes are clear bilaterally. Face is symmetric with normal facial animation and normal facial sensation. Speech is clear with no dysarthria noted. There is no hypophonia. There is no lip, neck/head, jaw or voice tremor. Neck is supple with full range of passive and active motion. There are no carotid bruits on auscultation. Oropharynx exam reveals: mild mouth dryness, good dental hygiene and mild airway crowding, due to larger tongue and longer uvula, tonsils small. Mallampati is class II. Tongue protrudes centrally and palate elevates symmetrically.   Chest: Clear to auscultation without wheezing, rhonchi or crackles noted.  Heart: S1+S2+0, regular and normal without murmurs, rubs or gallops noted.   Abdomen: Soft, non-tender and non-distended with normal bowel sounds appreciated on auscultation. Pregnant abdomen.  Extremities: There is no pitting edema in the distal lower  extremities bilaterally. Pedal pulses are intact.  Skin: Warm and dry without trophic changes noted. There are no varicose veins.  Musculoskeletal: exam reveals no obvious joint deformities, tenderness or joint swelling or erythema.   Neurologically:  Mental status: The patient is awake, alert and oriented in all 4 spheres. Her immediate and remote memory, attention, language skills and fund of knowledge are appropriate. There is no evidence of aphasia, agnosia, apraxia or anomia. Speech is clear with normal prosody and enunciation. Thought process is linear. Mood is normal and affect is blunted and constricted.  Cranial nerves II - XII are as described above under HEENT exam. In addition: shoulder shrug is normal with equal shoulder height noted. Motor exam: Normal bulk, strength and tone is noted. There is no drift, tremor or rebound. Romberg is negative. Reflexes are 2+ throughout. Babinski: Toes are flexor bilaterally. Fine motor skills and coordination: intact with normal finger taps, normal hand movements, normal rapid alternating patting, normal foot taps and normal foot agility.  Cerebellar testing: No dysmetria or intention tremor on finger to nose  testing. Heel to shin is unremarkable bilaterally. There is no truncal or gait ataxia.  Sensory exam: intact to light touch, pinprick, vibration, temperature sense in the upper and lower extremities.  Gait, station and balance: She stands easily. No veering to one side is noted. No leaning to one side is noted. Posture is age-appropriate and stance is narrow based. Gait shows normal stride length and pace. Tandem walk is unremarkable. Intact toe and heel stance is noted.               Assessment and Plan:  In summary, Delorice Bannister is a very pleasant 25 y.o.-year old female with an underlying medical history of mood disorder, reflux disease, currently pregnant in the second trimester, whose history and physical exam are concerning for  obstructive sleep apnea (OSA). I had a long chat with the patient and her mother about my findings and the diagnosis of OSA, its prognosis and treatment options. We talked about medical treatments, surgical interventions and non-pharmacological approaches. I explained in particular the risks and ramifications of untreated moderate to severe OSA, especially with respect to developing cardiovascular disease down the Road, including congestive heart failure, difficult to treat hypertension, cardiac arrhythmias, or stroke. Even type 2 diabetes has, in part, been linked to untreated OSA. Symptoms of untreated OSA include daytime sleepiness, memory problems, mood irritability and mood disorder such as depression and anxiety, lack of energy, as well as recurrent headaches, especially morning headaches. We talked about smoking cessation and trying to maintain a healthy lifestyle in general, as well as the importance of weight control. I encouraged the patient to eat healthy, exercise daily and keep well hydrated, to keep a scheduled bedtime and wake time routine, to not skip any meals and eat healthy snacks in between meals. I advised the patient not to drive when feeling sleepy. I recommended the following at this time: sleep study with potential positive airway pressure titration. (We will score hypopneas at 4% and split the sleep study into diagnostic and treatment portion, if the estimated. 2 hour AHI is >15/h).   I explained the sleep test procedure to the patient and also outlined possible surgical and non-surgical treatment options of OSA, including the use of a custom-made dental device (which would require a referral to a specialist dentist or oral surgeon), upper airway surgical options, such as pillar implants, radiofrequency surgery, tongue base surgery, and UPPP (which would involve a referral to an ENT surgeon). Rarely, jaw surgery such as mandibular advancement may be considered.  I also explained the  CPAP treatment option to the patient, who indicated that she would be willing to try CPAP if the need arises. I explained the importance of being compliant with PAP treatment, not only for insurance purposes but primarily to improve Her symptoms, and for the patient's long term health benefit, including to reduce Her cardiovascular risks. I answered all their questions today and the patient and her mother were in agreement. I would like to see her back after the sleep study is completed and encouraged her to call with any interim questions, concerns, problems or updates.   Thank you very much for allowing me to participate in the care of this nice patient. If I can be of any further assistance to you please do not hesitate to call me at [phone].  Sincerely,   Huston Foley, MD, PhD

## 2015-12-02 NOTE — Patient Instructions (Signed)

## 2015-12-05 ENCOUNTER — Encounter: Payer: Self-pay | Admitting: Obstetrics and Gynecology

## 2015-12-05 DIAGNOSIS — Z369 Encounter for antenatal screening, unspecified: Secondary | ICD-10-CM | POA: Insufficient documentation

## 2015-12-08 ENCOUNTER — Ambulatory Visit (INDEPENDENT_AMBULATORY_CARE_PROVIDER_SITE_OTHER): Payer: Medicaid Other | Admitting: Neurology

## 2015-12-08 ENCOUNTER — Ambulatory Visit (INDEPENDENT_AMBULATORY_CARE_PROVIDER_SITE_OTHER): Payer: Medicaid Other | Admitting: Obstetrics & Gynecology

## 2015-12-08 ENCOUNTER — Ambulatory Visit (HOSPITAL_COMMUNITY): Payer: Medicaid Other

## 2015-12-08 VITALS — BP 123/72 | HR 81 | Temp 98.6°F | Wt 193.3 lb

## 2015-12-08 DIAGNOSIS — Z331 Pregnant state, incidental: Secondary | ICD-10-CM | POA: Diagnosis not present

## 2015-12-08 DIAGNOSIS — G479 Sleep disorder, unspecified: Secondary | ICD-10-CM

## 2015-12-08 DIAGNOSIS — Z3402 Encounter for supervision of normal first pregnancy, second trimester: Secondary | ICD-10-CM | POA: Diagnosis not present

## 2015-12-08 DIAGNOSIS — Z87898 Personal history of other specified conditions: Secondary | ICD-10-CM

## 2015-12-08 DIAGNOSIS — R0683 Snoring: Secondary | ICD-10-CM

## 2015-12-08 DIAGNOSIS — O26892 Other specified pregnancy related conditions, second trimester: Secondary | ICD-10-CM

## 2015-12-08 DIAGNOSIS — F1291 Cannabis use, unspecified, in remission: Secondary | ICD-10-CM

## 2015-12-08 DIAGNOSIS — Z8619 Personal history of other infectious and parasitic diseases: Secondary | ICD-10-CM

## 2015-12-08 DIAGNOSIS — Z1389 Encounter for screening for other disorder: Secondary | ICD-10-CM | POA: Diagnosis not present

## 2015-12-08 DIAGNOSIS — G4720 Circadian rhythm sleep disorder, unspecified type: Secondary | ICD-10-CM

## 2015-12-08 DIAGNOSIS — Z3492 Encounter for supervision of normal pregnancy, unspecified, second trimester: Secondary | ICD-10-CM

## 2015-12-08 DIAGNOSIS — Z369 Encounter for antenatal screening, unspecified: Secondary | ICD-10-CM

## 2015-12-08 DIAGNOSIS — G4710 Hypersomnia, unspecified: Secondary | ICD-10-CM | POA: Diagnosis not present

## 2015-12-08 DIAGNOSIS — N898 Other specified noninflammatory disorders of vagina: Secondary | ICD-10-CM

## 2015-12-08 LAB — POCT URINALYSIS DIPSTICK
BILIRUBIN UA: NEGATIVE
GLUCOSE UA: NEGATIVE
KETONES UA: NEGATIVE
Nitrite, UA: NEGATIVE
PH UA: 6
RBC UA: NEGATIVE
Spec Grav, UA: 1.015
Urobilinogen, UA: 0.2

## 2015-12-08 MED ORDER — FLUCONAZOLE 150 MG PO TABS: 150.0000 mg | ORAL_TABLET | Freq: Once | ORAL | 0 refills | Status: AC

## 2015-12-08 NOTE — Progress Notes (Signed)
Subjective:  Cynthia Kent is a 25 y.o. G1P0 at 4740w6d being seen today for ongoing prenatal care.  She is currently monitored for the following issues for this low-risk pregnancy and has Supervision of normal pregnancy in second trimester; Snoring; History of trichomoniasis; History of marijuana use; Vaginal discharge during pregnancy in second trimester; and incomplete anatomy scan on her problem list.  Patient reports vaginal irritation and and discharge.  Pt reports taking OTC meds with no relief. .  Contractions: Not present. Vag. Bleeding: None.  Movement: Present. Denies leaking of fluid.   The following portions of the patient's history were reviewed and updated as appropriate: allergies, current medications, past family history, past medical history, past social history, past surgical history and problem list. Problem list updated.  Objective:   Vitals:   12/08/15 1423  BP: 123/72  Pulse: 81  Temp: 98.6 F (37 C)  Weight: 193 lb 4.8 oz (87.7 kg)    Fetal Status:     Movement: Present     General:  Alert, oriented and cooperative. Patient is in no acute distress.  Skin: Skin is warm and dry. No rash noted.   Cardiovascular: Normal heart rate noted  Respiratory: Normal respiratory effort, no problems with respiration noted  Abdomen: Soft, gravid, appropriate for gestational age. Pain/Pressure: Present     Pelvic:  Cervical exam deferred        Extremities: Normal range of motion.  Edema: Trace  Mental Status: Normal mood and affect. Normal behavior. Normal judgment and thought content.   Urinalysis: Urine Protein: Trace Urine Glucose: Negative  Assessment and Plan:  Pregnancy: G1P0 at 5740w6d  1. Encounter for supervision of normal first pregnancy in second trimester  - POCT Urinalysis Dipstick - US MFM OB FOLLOW UP; Future- to complete anatomy 28 week labs next visit  2. Supervision of normal pregnancy in second trimester   3. History of trichomoniasis  -  Vaginitis/Vaginosis, DNA Probe  4. History of marijuana use  5. Vaginal discharge during pregnancy in second trimester  F/ui Affirm test that was done today - fluconazole (DIFLUCAN) 150 MG tablet; Take 1 tablet (150 mg total) by mouth once. Repeat in 3 days  Dispense: 2 tablet; Refill: 0  6. Snoring Has sleep study scheduled for 12/14/2015  7. incomplete anatomy scan  - US MFM OB FOLLOW UP; Future  Preterm labor symptoms and general obstetric precautions including but not limited to vaginal bleeding, contractions, leaking of fluid and fetal movement were reviewed in detail with the patient. Please refer to After Visit Summary for other counseling recommendations.  Return in about 4 weeks (around 01/05/2016).   Willodean Rosenthalarolyn Harraway-Smith, MD

## 2015-12-08 NOTE — Patient Instructions (Signed)

## 2015-12-08 NOTE — Progress Notes (Signed)
Pt c/o vaginal itching and irritation. She c/o sharp pain in lower abdomen.

## 2015-12-10 LAB — VAGINITIS/VAGINOSIS, DNA PROBE
Candida Species: NEGATIVE
GARDNERELLA VAGINALIS: POSITIVE — AB
TRICHOMONAS VAG: NEGATIVE

## 2015-12-12 ENCOUNTER — Telehealth: Payer: Self-pay | Admitting: Neurology

## 2015-12-12 NOTE — Telephone Encounter (Signed)
Patient referred by Dr. Vergie LivingPickens, seen by me on 12/02/15, diagnostic PSG on 12/08/15.   Please call and notify the patient that the recent sleep study did not show any significant obstructive sleep apnea or leg twitching, oxygen levels were good throughout. I can see her as needed. We don't have to make a FU appointment to discuss, but would be happy to if preferred. We can also send her a copy of the results.  Also, route or fax report to PCP and referring MD, if other than PCP.  Once you have spoken to patient, you can close this encounter.   Thanks,  Huston FoleySaima Tess Potts, MD, PhD [facility])

## 2015-12-15 ENCOUNTER — Ambulatory Visit (HOSPITAL_COMMUNITY)
Admission: RE | Admit: 2015-12-15 | Discharge: 2015-12-15 | Disposition: A | Discharge status: Home / Self Care | Payer: Medicaid Other | Source: Ambulatory Visit | Attending: Obstetrics & Gynecology | Admitting: Obstetrics & Gynecology

## 2015-12-15 DIAGNOSIS — Z369 Encounter for antenatal screening, unspecified: Secondary | ICD-10-CM

## 2015-12-15 DIAGNOSIS — Z36 Encounter for antenatal screening of mother: Secondary | ICD-10-CM | POA: Insufficient documentation

## 2015-12-15 DIAGNOSIS — Z3A00 Weeks of gestation of pregnancy not specified: Secondary | ICD-10-CM | POA: Insufficient documentation

## 2015-12-15 DIAGNOSIS — Z3492 Encounter for supervision of normal pregnancy, unspecified, second trimester: Secondary | ICD-10-CM

## 2015-12-15 DIAGNOSIS — Z3402 Encounter for supervision of normal first pregnancy, second trimester: Secondary | ICD-10-CM

## 2015-12-15 NOTE — Telephone Encounter (Signed)
I spoke to patient and she is aware of results and recommendations. Patient requested f/u app and we were able to set one up for tomorrow.

## 2015-12-16 ENCOUNTER — Ambulatory Visit: Payer: Self-pay | Admitting: Neurology

## 2015-12-16 ENCOUNTER — Telehealth: Payer: Self-pay

## 2015-12-16 ENCOUNTER — Other Ambulatory Visit: Payer: Self-pay | Admitting: Obstetrics & Gynecology

## 2015-12-16 DIAGNOSIS — B9689 Other specified bacterial agents as the cause of diseases classified elsewhere: Secondary | ICD-10-CM

## 2015-12-16 DIAGNOSIS — N760 Acute vaginitis: Principal | ICD-10-CM

## 2015-12-16 MED ORDER — METRONIDAZOLE 500 MG PO TABS: 500.0000 mg | ORAL_TABLET | Freq: Two times a day (BID) | ORAL | 0 refills | Status: DC

## 2015-12-16 NOTE — Telephone Encounter (Signed)
Patient called same day of appt and said that she would be late. Phone room rescheduled her for tomorrow.

## 2015-12-17 ENCOUNTER — Encounter: Payer: Self-pay | Admitting: Neurology

## 2015-12-17 ENCOUNTER — Ambulatory Visit (INDEPENDENT_AMBULATORY_CARE_PROVIDER_SITE_OTHER): Payer: Medicaid Other | Admitting: Neurology

## 2015-12-17 VITALS — BP 126/60 | HR 78 | Resp 16 | Ht 66.0 in | Wt 195.0 lb

## 2015-12-17 DIAGNOSIS — G479 Sleep disorder, unspecified: Secondary | ICD-10-CM | POA: Diagnosis not present

## 2015-12-17 NOTE — Patient Instructions (Signed)
Your sleep study showed benign findings.  Please remember to try to maintain good sleep hygiene, which means: Keep a regular sleep and wake schedule, try not to exercise or have a meal within 2 hours of your bedtime, try to keep your bedroom conducive for sleep, that is, cool and dark, without light distractors such as an illuminated alarm clock, and refrain from watching TV right before sleep or in the middle of the night and do not keep the TV or radio on during the night. Also, try not to use or play on electronic devices at bedtime, such as your cell phone, tablet PC or laptop. If you like to read at bedtime on an electronic device, try to dim the background light as much as possible. Do not eat in the middle of the night.

## 2015-12-17 NOTE — Progress Notes (Signed)
Subjective:    Patient ID: Cynthia Kent is a 25 y.o. female.  HPI     Interim history:   Cynthia Kent is a 25 year old right-handed woman with an underlying medical history of mood disorder, reflux disease, currently pregnant in the second trimester, who presents for follow-up consultation after her recent sleep study. The patient is accompanied by her mother and nephew today. She missed an appointment on 12/16/2015. I first met her on 12/02/2015 at the request of her OB/GYN, at which time she reported snoring and excessive daytime somnolence as well as witnessed apneic pauses per mom's description. I invited her back for sleep study. She had a baseline sleep study on 12/08/2015. I went over her sleep study results with her in detail today. Sleep efficiency was reduced at 63% with a prolonged sleep latency of 102 minutes and wake after sleep onset of 63 minutes with moderate sleep fragmentation noted. Her arousal index was mildly elevated at 10.9 per hour secondary to spontaneous arousals. She had an increased percentage of stage I and stage II sleep, absence of slow-wave sleep and a decreased percentage of REM sleep at 15.5% with a REM latency of 115.5 minutes. She had no significant PLMS, EKG or EEG changes. Mild to moderate snoring was noted. She slept mostly on her sides. Total AHI was 0 per hour. Average oxygen saturation was 97%, nadir was 94%.  Today, 12/17/2015: She reports no new symptoms, and feels, she did not sleep very well at all during the sleep study, but otherwise has no new issues.  Previously:  12/02/2015: She reports snoring and excessive daytime somnolence. Her Epworth sleepiness score is 16 out of 24 today, her fatigue score is 57 out of 63. Mom has noted apneic pauses. She does not have a set sleep schedule. She does not work. She lives with mom. She goes to bed late, sometimes 2 AM oh 3 AM. Wakeup time diuresis well. She does not wake up rested. She is not a good  sleeper and has not been a good sleeper. She has occasional morning headaches. She has occasional restless leg symptoms, mother also has restless leg syndrome. Mom was diagnosed with sleep apnea as well. Of note, the patient drinks caffeine occasionally and smokes occasionally. She is advised about the importance of complete smoking cessation. She does not drink any alcohol and denies using any illicit drugs. I reviewed your office note from 11/17/2015.   Her Past Medical History Is Significant For: Past Medical History:  Diagnosis Date  . Acid reflux disease   . ADHD (attention deficit hyperactivity disorder)   . Anxiety   . Bipolar disorder ([place])   . Depression   . Drug-seeking behavior   . 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 Ovarian cyst   . Sleep apnea     Her Past Surgical History Is Significant For: Past Surgical History:  Procedure Laterality Date  . LEG SURGERY     when patient was 25 years old    Her Family History Is Significant For: No family history on file.  Her Social History Is Significant For: Social History   Social History  . Marital status: Single    Spouse name: N/A  . Number of children: N/A  . Years of education: HS   Social History Main Topics  . Smoking status: Light Tobacco Smoker    Types: Cigarettes  . Smokeless tobacco: Never Used     Comment: Currently will take "a puff"   .  Alcohol use No     Comment: not since pregnancy   . Drug use: No  . Sexual activity: Yes    Birth control/ protection: None   Other Topics Concern  . None   Social History Narrative   Drinks ginger ale     Her Allergies Are:  Allergies  Allergen Reactions  . Amoxicillin Hives  . Hydrocodone Itching  . Latex Swelling    Irritates her skin   :   Her Current Medications Are:  Outpatient Encounter Prescriptions as of 12/17/2015  Medication Sig  . clotrimazole (CLOTRIMAZOLE-7) 1 % vaginal cream Place 1 Applicatorful vaginally at bedtime.  . metroNIDAZOLE  (FLAGYL) 500 MG tablet Take 1 tablet (500 mg total) by mouth 2 (two) times daily.  . Prenatal Multivit-Min-Fe-FA (PRENATAL VITAMINS) 0.8 MG tablet Take 1 tablet by mouth daily.   No facility-administered encounter medications on file as of 12/17/2015.   :  Review of Systems:  Out of a complete 14 point review of systems, all are reviewed and negative with the exception of these symptoms as listed below:  Review of Systems  Neurological:       Patient is here to go over her sleep study.  Patient feels that she did not sleep very well during the study.     Objective:  Neurologic Exam  Physical Exam Physical Examination:   Vitals:   12/17/15 1519  BP: 126/60  Pulse: 78  Resp: 16    General Examination: The patient is a very pleasant 25 y.o. female in no acute distress. She appears well-developed and well-nourished and adequately groomed.   HEENT: Normocephalic, atraumatic, pupils are equal, round and reactive to light and accommodation. Funduscopic exam is normal with sharp disc margins noted. Extraocular tracking is good without limitation to gaze excursion or nystagmus noted. Normal smooth pursuit is noted. Hearing is grossly intact. Tympanic membranes are clear bilaterally. Face is symmetric with normal facial animation and normal facial sensation. Speech is clear with no dysarthria noted. There is no hypophonia. There is no lip, neck/head, jaw or voice tremor. Neck is supple with full range of passive and active motion. There are no carotid bruits on auscultation. Oropharynx exam reveals: mild mouth dryness, good dental hygiene and mild airway crowding, due to larger tongue and longer uvula, tonsils small. Mallampati is class II. Tongue protrudes centrally and palate elevates symmetrically.   Chest: Clear to auscultation without wheezing, rhonchi or crackles noted.  Heart: S1+S2+0, regular and normal without murmurs, rubs or gallops noted.   Abdomen: Soft, non-tender and  non-distended with normal bowel sounds appreciated on auscultation. Pregnant abdomen.  Extremities: There is no pitting edema in the distal lower extremities bilaterally. Pedal pulses are intact.  Skin: Warm and dry without trophic changes noted. There are no varicose veins.  Musculoskeletal: exam reveals no obvious joint deformities, tenderness or joint swelling or erythema.   Neurologically:  Mental status: The patient is awake, alert and oriented in all 4 spheres. Her immediate and remote memory, attention, language skills and fund of knowledge are appropriate. There is no evidence of aphasia, agnosia, apraxia or anomia. Speech is clear with normal prosody and enunciation. Thought process is linear. Mood is normal and affect is blunted and constricted.  Cranial nerves II - XII are as described above under HEENT exam. In addition: shoulder shrug is normal with equal shoulder height noted. Motor exam: Normal bulk, strength and tone is noted. There is no drift, tremor or rebound. Romberg is negative. Reflexes  are 2+ throughout. Fine motor skills and coordination: intact with normal finger taps, normal hand movements, normal rapid alternating patting, normal foot taps and normal foot agility.  Cerebellar testing: No dysmetria or intention tremor on finger to nose testing. Heel to shin is unremarkable bilaterally. There is no truncal or gait ataxia.  Sensory exam: intact to light touch in the upper and lower extremities.  Gait, station and balance: She stands easily. No veering to one side is noted. No leaning to one side is noted. Posture is age-appropriate and stance is narrow based. Gait shows normal stride length and pace. Tandem walk is unremarkable.   Assessment and Plan:  In summary, Cynthia Kent is a very pleasant 25 year old female with an underlying medical history of mood disorder, reflux disease, currently pregnant in the second trimester, who presents for follow-up consultation  after her recent sleep study. She had a sleep study about a week ago and we talked about the test results in detail. She did not sleep very well, had sleep disruption and light stage sleep, absence of slow-wave sleep but did achieve REM sleep and there was no evidence of obstructive sleep apnea, no need for CPAP therapy, oxygen levels very good throughout the night. She had no PLMS. She is reassured in all of this, we talked about sleep hygiene today and she is encouraged to try to make enough sleep time, stay well-hydrated with water, exercise in moderation. At this juncture, I can see her back on an as-needed basis. I answered all her questions today and the patient and her mother were in agreement.  I spent 25 minutes in total face-to-face time with the patient, more than 50% of which was spent in counseling and coordination of care, reviewing test results, reviewing medication and discussing or reviewing the diagnosis of sleep disturbance, its prognosis and treatment options.

## 2015-12-19 ENCOUNTER — Encounter: Payer: Self-pay | Admitting: Neurology

## 2015-12-22 NOTE — Progress Notes (Signed)
Patient notified

## 2016-01-05 ENCOUNTER — Encounter: Payer: Self-pay | Admitting: Obstetrics

## 2016-01-05 ENCOUNTER — Ambulatory Visit (INDEPENDENT_AMBULATORY_CARE_PROVIDER_SITE_OTHER): Payer: Medicaid Other | Admitting: Obstetrics

## 2016-01-05 VITALS — BP 102/66 | HR 76 | Temp 97.9°F | Wt 197.8 lb

## 2016-01-05 DIAGNOSIS — Z3493 Encounter for supervision of normal pregnancy, unspecified, third trimester: Secondary | ICD-10-CM | POA: Diagnosis not present

## 2016-01-05 NOTE — Progress Notes (Signed)
Patient reports she is doing well today. Unable to void

## 2016-01-05 NOTE — Progress Notes (Signed)
Subjective:    Cynthia PartridgeDenise Kent is a 25 y.o. female being seen today for her obstetrical visit. She is at 4118w6d gestation. Patient reports no complaints. Fetal movement: normal.  Problem List Items Addressed This Visit    None    Visit Diagnoses   None.    Patient Active Problem List   Diagnosis Date Noted  . incomplete anatomy scan 12/05/2015  . Supervision of normal pregnancy in second trimester 11/17/2015  . Snoring 11/17/2015  . History of trichomoniasis 11/17/2015  . History of marijuana use 11/17/2015  . Vaginal discharge during pregnancy in second trimester 11/17/2015   Objective:    BP 102/66   Pulse 76   Temp 97.9 F (36.6 C)   Wt 197 lb 12.8 oz (89.7 kg)   LMP  (LMP Unknown)   BMI 31.93 kg/m  FHT:  140 BPM  Uterine Size: size equals dates  Presentation: unsure     Assessment:    Pregnancy @ 2518w6d weeks   Plan:     labs reviewed, problem list updated Consent signed. GBS sent TDAP offered  Rhogam given for RH negative Pediatrician: discussed. Infant feeding: plans to breastfeed. Maternity leave: discussed. Cigarette smoking: Occasional No orders of the defined types were placed in this encounter.  No orders of the defined types were placed in this encounter.  Follow up in 2 Weeks.

## 2016-01-08 ENCOUNTER — Encounter: Payer: Self-pay | Admitting: *Deleted

## 2016-01-09 ENCOUNTER — Other Ambulatory Visit: Payer: Medicaid Other

## 2016-01-09 DIAGNOSIS — Z3493 Encounter for supervision of normal pregnancy, unspecified, third trimester: Secondary | ICD-10-CM

## 2016-01-10 LAB — HIV ANTIBODY (ROUTINE TESTING W REFLEX): HIV Screen 4th Generation wRfx: NONREACTIVE

## 2016-01-10 LAB — CBC
HEMATOCRIT: 34.6 % (ref 34.0–46.6)
HEMOGLOBIN: 12 g/dL (ref 11.1–15.9)
MCH: 31.7 pg (ref 26.6–33.0)
MCHC: 34.7 g/dL (ref 31.5–35.7)
MCV: 91 fL (ref 79–97)
PLATELETS: 202 10*3/uL (ref 150–379)
RBC: 3.79 x10E6/uL (ref 3.77–5.28)
RDW: 13.6 % (ref 12.3–15.4)
WBC: 10.6 10*3/uL (ref 3.4–10.8)

## 2016-01-10 LAB — GLUCOSE TOLERANCE, 2 HOURS W/ 1HR
GLUCOSE, 1 HOUR: 154 mg/dL (ref 65–179)
GLUCOSE, 2 HOUR: 146 mg/dL (ref 65–152)
Glucose, Fasting: 91 mg/dL (ref 65–91)

## 2016-01-10 LAB — RPR: RPR Ser Ql: NONREACTIVE

## 2016-01-12 ENCOUNTER — Other Ambulatory Visit: Payer: Medicaid Other

## 2016-01-29 ENCOUNTER — Encounter: Payer: Self-pay | Admitting: Advanced Practice Midwife

## 2016-01-30 ENCOUNTER — Inpatient Hospital Stay (HOSPITAL_COMMUNITY)
Admission: AD | Admit: 2016-01-30 | Discharge: 2016-01-30 | Disposition: A | Discharge status: Home / Self Care | Payer: Medicaid Other | Source: Ambulatory Visit | Attending: Obstetrics and Gynecology | Admitting: Obstetrics and Gynecology

## 2016-01-30 ENCOUNTER — Encounter (HOSPITAL_COMMUNITY): Payer: Self-pay | Admitting: *Deleted

## 2016-01-30 DIAGNOSIS — N898 Other specified noninflammatory disorders of vagina: Secondary | ICD-10-CM | POA: Diagnosis not present

## 2016-01-30 DIAGNOSIS — Z3689 Encounter for other specified antenatal screening: Secondary | ICD-10-CM

## 2016-01-30 DIAGNOSIS — O99333 Smoking (tobacco) complicating pregnancy, third trimester: Secondary | ICD-10-CM | POA: Diagnosis not present

## 2016-01-30 DIAGNOSIS — O9989 Other specified diseases and conditions complicating pregnancy, childbirth and the puerperium: Secondary | ICD-10-CM

## 2016-01-30 DIAGNOSIS — M791 Myalgia: Secondary | ICD-10-CM | POA: Insufficient documentation

## 2016-01-30 DIAGNOSIS — O26893 Other specified pregnancy related conditions, third trimester: Secondary | ICD-10-CM | POA: Insufficient documentation

## 2016-01-30 DIAGNOSIS — Z3493 Encounter for supervision of normal pregnancy, unspecified, third trimester: Secondary | ICD-10-CM

## 2016-01-30 DIAGNOSIS — Z880 Allergy status to penicillin: Secondary | ICD-10-CM | POA: Insufficient documentation

## 2016-01-30 DIAGNOSIS — F17210 Nicotine dependence, cigarettes, uncomplicated: Secondary | ICD-10-CM | POA: Insufficient documentation

## 2016-01-30 DIAGNOSIS — Z3A32 32 weeks gestation of pregnancy: Secondary | ICD-10-CM | POA: Insufficient documentation

## 2016-01-30 DIAGNOSIS — M7918 Myalgia, other site: Secondary | ICD-10-CM

## 2016-01-30 LAB — URINALYSIS, ROUTINE W REFLEX MICROSCOPIC
Bilirubin Urine: NEGATIVE
GLUCOSE, UA: NEGATIVE mg/dL
HGB URINE DIPSTICK: NEGATIVE
Ketones, ur: NEGATIVE mg/dL
Leukocytes, UA: NEGATIVE
Nitrite: NEGATIVE
Protein, ur: NEGATIVE mg/dL
SPECIFIC GRAVITY, URINE: 1.02 (ref 1.005–1.030)
pH: 6 (ref 5.0–8.0)

## 2016-01-30 LAB — WET PREP, GENITAL
Clue Cells Wet Prep HPF POC: NONE SEEN
SPERM: NONE SEEN
Trich, Wet Prep: NONE SEEN
YEAST WET PREP: NONE SEEN

## 2016-01-30 LAB — OB RESULTS CONSOLE GC/CHLAMYDIA: Gonorrhea: NEGATIVE

## 2016-01-30 MED ORDER — CYCLOBENZAPRINE HCL 10 MG PO TABS: 10.0000 mg | ORAL_TABLET | Freq: Three times a day (TID) | ORAL | 0 refills | Status: DC | PRN

## 2016-01-30 MED ORDER — CYCLOBENZAPRINE HCL 10 MG PO TABS
10.0000 mg | ORAL_TABLET | Freq: Three times a day (TID) | ORAL | Status: DC | PRN
  Administered 2016-01-30: 10 mg via ORAL
  Filled 2016-01-30: qty 1
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
## 2016-01-30 NOTE — Discharge Instructions (Signed)
Third Trimester of Pregnancy The third trimester is from week 29 through week 42, months 7 through 9. The third trimester is a time when the fetus is growing rapidly. At the end of the ninth month, the fetus is about 20 inches in length and weighs 6-10 pounds.  BODY CHANGES Your body goes through many changes during pregnancy. The changes vary from woman to woman.   Your weight will continue to increase. You can expect to gain 25-35 pounds (11-16 kg) by the end of the pregnancy.  You may begin to get stretch marks on your hips, abdomen, and breasts.  You may urinate more often because the fetus is moving lower into your pelvis and pressing on your bladder.  You may develop or continue to have heartburn as a result of your pregnancy.  You may develop constipation because certain hormones are causing the muscles that push waste through your intestines to slow down.  You may develop hemorrhoids or swollen, bulging veins (varicose veins).  You may have pelvic pain because of the weight gain and pregnancy hormones relaxing your joints between the bones in your pelvis. Backaches may result from overexertion of the muscles supporting your posture.  You may have changes in your hair. These can include thickening of your hair, rapid growth, and changes in texture. Some women also have hair loss during or after pregnancy, or hair that feels dry or thin. Your hair will most likely return to normal after your baby is born.  Your breasts will continue to grow and be tender. A yellow discharge may leak from your breasts called colostrum.  Your belly button may stick out.  You may feel short of breath because of your expanding uterus.  You may notice the fetus "dropping," or moving lower in your abdomen.  You may have a bloody mucus discharge. This usually occurs a few days to a week before labor begins.  Your cervix becomes thin and soft (effaced) near your due date. WHAT TO EXPECT AT YOUR PRENATAL  EXAMS  You will have prenatal exams every 2 weeks until week 36. Then, you will have weekly prenatal exams. During a routine prenatal visit:  You will be weighed to make sure you and the fetus are growing normally.  Your blood pressure is taken.  Your abdomen will be measured to track your baby's growth.  The fetal heartbeat will be listened to.  Any test results from the previous visit will be discussed.  You may have a cervical check near your due date to see if you have effaced. At around 36 weeks, your caregiver will check your cervix. At the same time, your caregiver will also perform a test on the secretions of the vaginal tissue. This test is to determine if a type of bacteria, Group B streptococcus, is present. Your caregiver will explain this further. Your caregiver may ask you:  What your birth plan is.  How you are feeling.  If you are feeling the baby move.  If you have had any abnormal symptoms, such as leaking fluid, bleeding, severe headaches, or abdominal cramping.  If you are using any tobacco products, including cigarettes, chewing tobacco, and electronic cigarettes.  If you have any questions. Other tests or screenings that may be performed during your third trimester include:  Blood tests that check for low iron levels (anemia).  Fetal testing to check the health, activity level, and growth of the fetus. Testing is done if you have certain medical conditions or if   there are problems during the pregnancy.  HIV (human immunodeficiency virus) testing. If you are at high risk, you may be screened for HIV during your third trimester of pregnancy. FALSE LABOR You may feel small, irregular contractions that eventually go away. These are called Braxton Hicks contractions, or false labor. Contractions may last for hours, days, or even weeks before true labor sets in. If contractions come at regular intervals, intensify, or become painful, it is best to be seen by your  caregiver.  SIGNS OF LABOR   Menstrual-like cramps.  Contractions that are 5 minutes apart or less.  Contractions that start on the top of the uterus and spread down to the lower abdomen and back.  A sense of increased pelvic pressure or back pain.  A watery or bloody mucus discharge that comes from the vagina. If you have any of these signs before the 37th week of pregnancy, call your caregiver right away. You need to go to the hospital to get checked immediately. HOME CARE INSTRUCTIONS   Avoid all smoking, herbs, alcohol, and unprescribed drugs. These chemicals affect the formation and growth of the baby.  Do not use any tobacco products, including cigarettes, chewing tobacco, and electronic cigarettes. If you need help quitting, ask your health care provider. You may receive counseling support and other resources to help you quit.  Follow your caregiver's instructions regarding medicine use. There are medicines that are either safe or unsafe to take during pregnancy.  Exercise only as directed by your caregiver. Experiencing uterine cramps is a good sign to stop exercising.  Continue to eat regular, healthy meals.  Wear a good support bra for breast tenderness.  Do not use hot tubs, steam rooms, or saunas.  Wear your seat belt at all times when driving.  Avoid raw meat, uncooked cheese, cat litter boxes, and soil used by cats. These carry germs that can cause birth defects in the baby.  Take your prenatal vitamins.  Take 1500-2000 mg of calcium daily starting at the 20th week of pregnancy until you deliver your baby.  Try taking a stool softener (if your caregiver approves) if you develop constipation. Eat more high-fiber foods, such as fresh vegetables or fruit and whole grains. Drink plenty of fluids to keep your urine clear or pale yellow.  Take warm sitz baths to soothe any pain or discomfort caused by hemorrhoids. Use hemorrhoid cream if your caregiver approves.  If  you develop varicose veins, wear support hose. Elevate your feet for 15 minutes, 3-4 times a day. Limit salt in your diet.  Avoid heavy lifting, wear low heal shoes, and practice good posture.  Rest a lot with your legs elevated if you have leg cramps or low back pain.  Visit your dentist if you have not gone during your pregnancy. Use a soft toothbrush to brush your teeth and be gentle when you floss.  A sexual relationship may be continued unless your caregiver directs you otherwise.  Do not travel far distances unless it is absolutely necessary and only with the approval of your caregiver.  Take prenatal classes to understand, practice, and ask questions about the labor and delivery.  Make a trial run to the hospital.  Pack your hospital bag.  Prepare the baby's nursery.  Continue to go to all your prenatal visits as directed by your caregiver. SEEK MEDICAL CARE IF:  You are unsure if you are in labor or if your water has broken.  You have dizziness.  You have   mild pelvic cramps, pelvic pressure, or nagging pain in your abdominal area.  You have persistent nausea, vomiting, or diarrhea.  You have a bad smelling vaginal discharge.  You have pain with urination. SEEK IMMEDIATE MEDICAL CARE IF:   You have a fever.  You are leaking fluid from your vagina.  You have spotting or bleeding from your vagina.  You have severe abdominal cramping or pain.  You have rapid weight loss or gain.  You have shortness of breath with chest pain.  You notice sudden or extreme swelling of your face, hands, ankles, feet, or legs.  You have not felt your baby move in over an hour.  You have severe headaches that do not go away with medicine.  You have vision changes.   This information is not intended to replace advice given to you by your health care provider. Make sure you discuss any questions you have with your health care provider.   Document Released: 04/06/2001 Document  Revised: 05/03/2014 Document Reviewed: 06/13/2012 Elsevier Interactive Patient Education 2016 [employer] Back Pain in Pregnancy Back pain during pregnancy is common. It happens in about half of all pregnancies. It is important for you and your baby that you remain active during your pregnancy.If you feel that back pain is not allowing you to remain active or sleep well, it is time to see your caregiver. Back pain may be caused by several factors related to changes during your pregnancy.Fortunately, unless you had trouble with your back before your pregnancy, the pain is likely to get better after you deliver. Low back pain usually occurs between the fifth and seventh months of pregnancy. It can, however, happen in the first couple months. Factors that increase the risk of back problems include:   Previous back problems.  Injury to your back.  Having twins or multiple births.  A chronic cough.  Stress.  Job-related repetitive motions.  Muscle or spinal disease in the back.  Family history of back problems, ruptured (herniated) discs, or osteoporosis.  Depression, anxiety, and panic attacks. CAUSES   When you are pregnant, your body produces a hormone called relaxin. This hormonemakes the ligaments connecting the low back and pubic bones more flexible. This flexibility allows the baby to be delivered more easily. When your ligaments are loose, your muscles need to work harder to support your back. Soreness in your back can come from tired muscles. Soreness can also come from back tissues that are irritated since they are receiving less support.  As the baby grows, it puts pressure on the nerves and blood vessels in your pelvis. This can cause back pain.  As the baby grows and gets heavier during pregnancy, the uterus pushes the stomach muscles forward and changes your center of gravity. This makes your back muscles work harder to maintain good posture. SYMPTOMS  Lumbar pain during  pregnancy Lumbar pain during pregnancy usually occurs at or above the waist in the center of the back. There may be pain and numbness that radiates into your leg or foot. This is similar to low back pain experienced by non-pregnant women. It usually increases with sitting for long periods of time, standing, or repetitive lifting. Tenderness may also be present in the muscles along your upper back. Posterior pelvic pain during pregnancy Pain in the back of the pelvis is more common than lumbar pain in pregnancy. It is a deep pain felt in your side at the waistline, or across the tailbone (sacrum), or in both places.   You may have pain on one or both sides. This pain can also go into the buttocks and backs of the upper thighs. Pubic and groin pain may also be present. The pain does not quickly resolve with rest, and morning stiffness may also be present. Pelvic pain during pregnancy can be brought on by most activities. A high level of fitness before and during pregnancy may or may not prevent this problem. Labor pain is usually 1 to 2 minutes apart, lasts for about 1 minute, and involves a bearing down feeling or pressure in your pelvis. However, if you are at term with the pregnancy, constant low back pain can be the beginning of early labor, and you should be aware of this. DIAGNOSIS  X-rays of the back should not be done during the first 12 to 14 weeks of the pregnancy and only when absolutely necessary during the rest of the pregnancy. MRIs do not give off radiation and are safe during pregnancy. MRIs also should only be done when absolutely necessary. HOME CARE INSTRUCTIONS  Exercise as directed by your caregiver. Exercise is the most effective way to prevent or manage back pain. If you have a back problem, it is especially important to avoid sports that require sudden body movements. Swimming and walking are great activities.  Do not stand in one place for long periods of time.  Do not wear high  heels.  Sit in chairs with good posture. Use a pillow on your lower back if necessary. Make sure your head rests over your shoulders and is not hanging forward.  Try sleeping on your side, preferably the left side, with a pillow or two between your legs. If you are sore after a night's rest, your bedmay betoo soft.Try placing a board between your mattress and box spring.  Listen to your body when lifting.If you are experiencing pain, ask for help or try bending yourknees more so you can use your leg muscles rather than your back muscles. Squat down when picking up something from the floor. Do not bend over.  Eat a healthy diet. Try to gain weight within your caregiver's recommendations.  Use heat or cold packs 3 to 4 times a day for 15 minutes to help with the pain.  Only take over-the-counter or prescription medicines for pain, discomfort, or fever as directed by your caregiver. Sudden (acute) back pain  Use bed rest for only the most extreme, acute episodes of back pain. Prolonged bed rest over 48 hours will aggravate your condition.  Ice is very effective for acute conditions.  Put ice in a plastic bag.  Place a towel between your skin and the bag.  Leave the ice on for 10 to 20 minutes every 2 hours, or as needed.  Using heat packs for 30 minutes prior to activities is also helpful. Continued back pain See your caregiver if you have continued problems. Your caregiver can help or refer you for appropriate physical therapy. With conditioning, most back problems can be avoided. Sometimes, a more serious issue may be the cause of back pain. You should be seen right away if new problems seem to be developing. Your caregiver may recommend:  A maternity girdle.  An elastic sling.  A back brace.  A massage therapist or acupuncture. SEEK MEDICAL CARE IF:   You are not able to do most of your daily activities, even when taking the pain medicine you were given.  You need a  referral to a physical therapist or chiropractor.    You want to try acupuncture. SEEK IMMEDIATE MEDICAL CARE IF:  You develop numbness, tingling, weakness, or problems with the use of your arms or legs.  You develop severe back pain that is no longer relieved with medicines.  You have a sudden change in bowel or bladder control.  You have increasing pain in other areas of the body.  You develop shortness of breath, dizziness, or fainting.  You develop nausea, vomiting, or sweating.  You have back pain which is similar to labor pains.  You have back pain along with your water breaking or vaginal bleeding.  You have back pain or numbness that travels down your leg.  Your back pain developed after you fell.  You develop pain on one side of your back. You may have a kidney stone.  You see blood in your urine. You may have a bladder infection or kidney stone.  You have back pain with blisters. You may have shingles. Back pain is fairly common during pregnancy but should not be accepted as just part of the process. Back pain should always be treated as soon as possible. This will make your pregnancy as pleasant as possible.   This information is not intended to replace advice given to you by your health care provider. Make sure you discuss any questions you have with your health care provider.   Document Released: 07/21/2005 Document Revised: 07/05/2011 Document Reviewed: 09/01/2010 Elsevier Interactive Patient Education 2016 [employer]  

## 2016-02-02 ENCOUNTER — Ambulatory Visit (INDEPENDENT_AMBULATORY_CARE_PROVIDER_SITE_OTHER): Payer: Medicaid Other | Admitting: Certified Nurse Midwife

## 2016-02-02 VITALS — BP 116/72 | HR 98 | Temp 98.0°F

## 2016-02-02 DIAGNOSIS — Z3403 Encounter for supervision of normal first pregnancy, third trimester: Secondary | ICD-10-CM

## 2016-02-02 LAB — GC/CHLAMYDIA PROBE AMP (~~LOC~~) NOT AT ARMC
Chlamydia: NEGATIVE
NEISSERIA GONORRHEA: NEGATIVE

## 2016-02-02 MED ORDER — TETANUS-DIPHTH-ACELL PERTUSSIS 5-2.5-18.5 LF-MCG/0.5 IM SUSP
0.5000 mL | Freq: Once | INTRAMUSCULAR | Status: AC
  Administered 2016-02-02: 0.5 mL via INTRAMUSCULAR

## 2016-02-02 NOTE — Patient Instructions (Signed)
Third Trimester of Pregnancy The third trimester is from week 29 through week 42, months 7 through 9. The third trimester is a time when the fetus is growing rapidly. At the end of the ninth month, the fetus is about 20 inches in length and weighs 6-10 pounds.  BODY CHANGES Your body goes through many changes during pregnancy. The changes vary from woman to woman.   Your weight will continue to increase. You can expect to gain 25-35 pounds (11-16 kg) by the end of the pregnancy.  You may begin to get stretch marks on your hips, abdomen, and breasts.  You may urinate more often because the fetus is moving lower into your pelvis and pressing on your bladder.  You may develop or continue to have heartburn as a result of your pregnancy.  You may develop constipation because certain hormones are causing the muscles that push waste through your intestines to slow down.  You may develop hemorrhoids or swollen, bulging veins (varicose veins).  You may have pelvic pain because of the weight gain and pregnancy hormones relaxing your joints between the bones in your pelvis. Backaches may result from overexertion of the muscles supporting your posture.  You may have changes in your hair. These can include thickening of your hair, rapid growth, and changes in texture. Some women also have hair loss during or after pregnancy, or hair that feels dry or thin. Your hair will most likely return to normal after your baby is born.  Your breasts will continue to grow and be tender. A yellow discharge may leak from your breasts called colostrum.  Your belly button may stick out.  You may feel short of breath because of your expanding uterus.  You may notice the fetus "dropping," or moving lower in your abdomen.  You may have a bloody mucus discharge. This usually occurs a few days to a week before labor begins.  Your cervix becomes thin and soft (effaced) near your due date. WHAT TO EXPECT AT YOUR PRENATAL  EXAMS  You will have prenatal exams every 2 weeks until week 36. Then, you will have weekly prenatal exams. During a routine prenatal visit:  You will be weighed to make sure you and the fetus are growing normally.  Your blood pressure is taken.  Your abdomen will be measured to track your baby's growth.  The fetal heartbeat will be listened to.  Any test results from the previous visit will be discussed.  You may have a cervical check near your due date to see if you have effaced. At around 36 weeks, your caregiver will check your cervix. At the same time, your caregiver will also perform a test on the secretions of the vaginal tissue. This test is to determine if a type of bacteria, Group B streptococcus, is present. Your caregiver will explain this further. Your caregiver may ask you:  What your birth plan is.  How you are feeling.  If you are feeling the baby move.  If you have had any abnormal symptoms, such as leaking fluid, bleeding, severe headaches, or abdominal cramping.  If you are using any tobacco products, including cigarettes, chewing tobacco, and electronic cigarettes.  If you have any questions. Other tests or screenings that may be performed during your third trimester include:  Blood tests that check for low iron levels (anemia).  Fetal testing to check the health, activity level, and growth of the fetus. Testing is done if you have certain medical conditions or if   there are problems during the pregnancy.  HIV (human immunodeficiency virus) testing. If you are at high risk, you may be screened for HIV during your third trimester of pregnancy. FALSE LABOR You may feel small, irregular contractions that eventually go away. These are called Braxton Hicks contractions, or false labor. Contractions may last for hours, days, or even weeks before true labor sets in. If contractions come at regular intervals, intensify, or become painful, it is best to be seen by your  caregiver.  SIGNS OF LABOR   Menstrual-like cramps.  Contractions that are 5 minutes apart or less.  Contractions that start on the top of the uterus and spread down to the lower abdomen and back.  A sense of increased pelvic pressure or back pain.  A watery or bloody mucus discharge that comes from the vagina. If you have any of these signs before the 37th week of pregnancy, call your caregiver right away. You need to go to the hospital to get checked immediately. HOME CARE INSTRUCTIONS   Avoid all smoking, herbs, alcohol, and unprescribed drugs. These chemicals affect the formation and growth of the baby.  Do not use any tobacco products, including cigarettes, chewing tobacco, and electronic cigarettes. If you need help quitting, ask your health care provider. You may receive counseling support and other resources to help you quit.  Follow your caregiver's instructions regarding medicine use. There are medicines that are either safe or unsafe to take during pregnancy.  Exercise only as directed by your caregiver. Experiencing uterine cramps is a good sign to stop exercising.  Continue to eat regular, healthy meals.  Wear a good support bra for breast tenderness.  Do not use hot tubs, steam rooms, or saunas.  Wear your seat belt at all times when driving.  Avoid raw meat, uncooked cheese, cat litter boxes, and soil used by cats. These carry germs that can cause birth defects in the baby.  Take your prenatal vitamins.  Take 1500-2000 mg of calcium daily starting at the 20th week of pregnancy until you deliver your baby.  Try taking a stool softener (if your caregiver approves) if you develop constipation. Eat more high-fiber foods, such as fresh vegetables or fruit and whole grains. Drink plenty of fluids to keep your urine clear or pale yellow.  Take warm sitz baths to soothe any pain or discomfort caused by hemorrhoids. Use hemorrhoid cream if your caregiver approves.  If  you develop varicose veins, wear support hose. Elevate your feet for 15 minutes, 3-4 times a day. Limit salt in your diet.  Avoid heavy lifting, wear low heal shoes, and practice good posture.  Rest a lot with your legs elevated if you have leg cramps or low back pain.  Visit your dentist if you have not gone during your pregnancy. Use a soft toothbrush to brush your teeth and be gentle when you floss.  A sexual relationship may be continued unless your caregiver directs you otherwise.  Do not travel far distances unless it is absolutely necessary and only with the approval of your caregiver.  Take prenatal classes to understand, practice, and ask questions about the labor and delivery.  Make a trial run to the hospital.  Pack your hospital bag.  Prepare the baby's nursery.  Continue to go to all your prenatal visits as directed by your caregiver. SEEK MEDICAL CARE IF:  You are unsure if you are in labor or if your water has broken.  You have dizziness.  You have   mild pelvic cramps, pelvic pressure, or nagging pain in your abdominal area.  You have persistent nausea, vomiting, or diarrhea.  You have a bad smelling vaginal discharge.  You have pain with urination. SEEK IMMEDIATE MEDICAL CARE IF:   You have a fever.  You are leaking fluid from your vagina.  You have spotting or bleeding from your vagina.  You have severe abdominal cramping or pain.  You have rapid weight loss or gain.  You have shortness of breath with chest pain.  You notice sudden or extreme swelling of your face, hands, ankles, feet, or legs.  You have not felt your baby move in over an hour.  You have severe headaches that do not go away with medicine.  You have vision changes.   This information is not intended to replace advice given to you by your health care provider. Make sure you discuss any questions you have with your health care provider.   Document Released: 04/06/2001 Document  Revised: 05/03/2014 Document Reviewed: 06/13/2012 Elsevier Interactive Patient Education 2016 [employer]   Preterm Labor Information Preterm labor is when labor starts at less than 37 weeks of pregnancy. The normal length of a pregnancy is 39 to 41 weeks. CAUSES Often, there is no identifiable underlying cause as to why a woman goes into preterm labor. One of the most common known causes of preterm labor is infection. Infections of the uterus, cervix, vagina, amniotic sac, bladder, kidney, or even the lungs (pneumonia) can cause labor to start. Other suspected causes of preterm labor include:   Urogenital infections, such as yeast infections and bacterial vaginosis.   Uterine abnormalities (uterine shape, uterine septum, fibroids, or bleeding from the placenta).   A cervix that has been operated on (it may fail to stay closed).   Malformations in the fetus.   Multiple gestations (twins, triplets, and so on).   Breakage of the amniotic sac.  RISK FACTORS  Having a previous history of preterm labor.   Having premature rupture of membranes (PROM).   Having a placenta that covers the opening of the cervix (placenta previa).   Having a placenta that separates from the uterus (placental abruption).   Having a cervix that is too weak to hold the fetus in the uterus (incompetent cervix).   Having too much fluid in the amniotic sac (polyhydramnios).   Taking illegal drugs or smoking while pregnant.   Not gaining enough weight while pregnant.   Being younger than 18 and older than 25 years old.   Having a low socioeconomic status.   Being African American. SYMPTOMS Signs and symptoms of preterm labor include:   Menstrual-like cramps, abdominal pain, or back pain.  Uterine contractions that are regular, as frequent as six in an hour, regardless of their intensity (may be mild or painful).  Contractions that start on the top of the uterus and spread down to  the lower abdomen and back.   A sense of increased pelvic pressure.   A watery or bloody mucus discharge that comes from the vagina.  TREATMENT Depending on the length of the pregnancy and other circumstances, your health care provider may suggest bed rest. If necessary, there are medicines that can be given to stop contractions and to mature the fetal lungs. If labor happens before 34 weeks of pregnancy, a prolonged hospital stay may be recommended. Treatment depends on the condition of both you and the fetus.  WHAT SHOULD YOU DO IF YOU THINK YOU ARE IN PRETERM LABOR?   Call your health care provider right away. You will need to go to the hospital to get checked immediately. HOW CAN YOU PREVENT PRETERM LABOR IN FUTURE PREGNANCIES? You should:   Stop smoking if you smoke.  Maintain healthy weight gain and avoid chemicals and drugs that are not necessary.  Be watchful for any type of infection.  Inform your health care provider if you have a known history of preterm labor.   This information is not intended to replace advice given to you by your health care provider. Make sure you discuss any questions you have with your health care provider.   Document Released: 07/03/2003 Document Revised: 12/13/2012 Document Reviewed: 05/15/2012 Elsevier Interactive Patient Education 2016 [employer]  
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
## 2016-02-09 ENCOUNTER — Ambulatory Visit (HOSPITAL_COMMUNITY)
Admission: RE | Admit: 2016-02-09 | Discharge: 2016-02-09 | Disposition: A | Discharge status: Home / Self Care | Payer: Medicaid Other | Source: Ambulatory Visit | Attending: Certified Nurse Midwife | Admitting: Certified Nurse Midwife

## 2016-02-09 DIAGNOSIS — O99343 Other mental disorders complicating pregnancy, third trimester: Secondary | ICD-10-CM | POA: Insufficient documentation

## 2016-02-09 DIAGNOSIS — Z3A33 33 weeks gestation of pregnancy: Secondary | ICD-10-CM | POA: Insufficient documentation

## 2016-02-09 DIAGNOSIS — Z3403 Encounter for supervision of normal first pregnancy, third trimester: Secondary | ICD-10-CM

## 2016-02-09 DIAGNOSIS — Z362 Encounter for other antenatal screening follow-up: Secondary | ICD-10-CM | POA: Diagnosis not present

## 2016-02-10 ENCOUNTER — Ambulatory Visit (HOSPITAL_COMMUNITY): Payer: Medicaid Other

## 2016-02-10 HISTORY — PX: FRACTURE SURGERY: SHX138

## 2016-02-12 ENCOUNTER — Inpatient Hospital Stay (HOSPITAL_COMMUNITY)
Admission: AD | Admit: 2016-02-12 | Discharge: 2016-02-12 | Disposition: A | Discharge status: Home / Self Care | Payer: Medicaid Other | Source: Ambulatory Visit | Attending: Obstetrics & Gynecology | Admitting: Obstetrics & Gynecology

## 2016-02-12 ENCOUNTER — Encounter (HOSPITAL_COMMUNITY): Payer: Self-pay

## 2016-02-12 ENCOUNTER — Encounter: Payer: Medicaid Other | Admitting: Obstetrics & Gynecology

## 2016-02-12 DIAGNOSIS — F17210 Nicotine dependence, cigarettes, uncomplicated: Secondary | ICD-10-CM | POA: Diagnosis not present

## 2016-02-12 DIAGNOSIS — O368120 Decreased fetal movements, second trimester, not applicable or unspecified: Secondary | ICD-10-CM | POA: Diagnosis not present

## 2016-02-12 DIAGNOSIS — O368130 Decreased fetal movements, third trimester, not applicable or unspecified: Secondary | ICD-10-CM | POA: Insufficient documentation

## 2016-02-12 DIAGNOSIS — Z3A34 34 weeks gestation of pregnancy: Secondary | ICD-10-CM | POA: Diagnosis not present

## 2016-02-12 DIAGNOSIS — Z3689 Encounter for other specified antenatal screening: Secondary | ICD-10-CM

## 2016-02-12 DIAGNOSIS — O99333 Smoking (tobacco) complicating pregnancy, third trimester: Secondary | ICD-10-CM | POA: Insufficient documentation

## 2016-02-12 NOTE — Discharge Instructions (Signed)
Fetal Movement Counts  Patient Name: __________________________________________________ Patient Due Date: ____________________  Performing a fetal movement count is highly recommended in high-risk pregnancies, but it is good for every pregnant woman to do. Your health care provider may ask you to start counting fetal movements at 28 weeks of the pregnancy. Fetal movements often increase:  · After eating a full meal.  · After physical activity.  · After eating or drinking something sweet or cold.  · At rest.  Pay attention to when you feel the baby is most active. This will help you notice a pattern of your baby's sleep and wake cycles and what factors contribute to an increase in fetal movement. It is important to perform a fetal movement count at the same time each day when your baby is normally most active.   HOW TO COUNT FETAL MOVEMENTS  1. Find a quiet and comfortable area to sit or lie down on your left side. Lying on your left side provides the best blood and oxygen circulation to your baby.  2. Write down the day and time on a sheet of paper or in a journal.  3. Start counting kicks, flutters, swishes, rolls, or jabs in a 2-hour period. You should feel at least 10 movements within 2 hours.  4. If you do not feel 10 movements in 2 hours, wait 2-3 hours and count again. Look for a change in the pattern or not enough counts in 2 hours.  SEEK MEDICAL CARE IF:  · You feel less than 10 counts in 2 hours, tried twice.  · There is no movement in over an hour.  · The pattern is changing or taking longer each day to reach 10 counts in 2 hours.  · You feel the baby is not moving as he or she usually does.  Date: ____________ Movements: ____________ Start time: ____________ Finish time: ____________   Date: ____________ Movements: ____________ Start time: ____________ Finish time: ____________  Date: ____________ Movements: ____________ Start time: ____________ Finish time: ____________  Date: ____________ Movements:  ____________ Start time: ____________ Finish time: ____________  Date: ____________ Movements: ____________ Start time: ____________ Finish time: ____________  Date: ____________ Movements: ____________ Start time: ____________ Finish time: ____________  Date: ____________ Movements: ____________ Start time: ____________ Finish time: ____________  Date: ____________ Movements: ____________ Start time: ____________ Finish time: ____________   Date: ____________ Movements: ____________ Start time: ____________ Finish time: ____________  Date: ____________ Movements: ____________ Start time: ____________ Finish time: ____________  Date: ____________ Movements: ____________ Start time: ____________ Finish time: ____________  Date: ____________ Movements: ____________ Start time: ____________ Finish time: ____________  Date: ____________ Movements: ____________ Start time: ____________ Finish time: ____________  Date: ____________ Movements: ____________ Start time: ____________ Finish time: ____________  Date: ____________ Movements: ____________ Start time: ____________ Finish time: ____________   Date: ____________ Movements: ____________ Start time: ____________ Finish time: ____________  Date: ____________ Movements: ____________ Start time: ____________ Finish time: ____________  Date: ____________ Movements: ____________ Start time: ____________ Finish time: ____________  Date: ____________ Movements: ____________ Start time: ____________ Finish time: ____________  Date: ____________ Movements: ____________ Start time: ____________ Finish time: ____________  Date: ____________ Movements: ____________ Start time: ____________ Finish time: ____________  Date: ____________ Movements: ____________ Start time: ____________ Finish time: ____________   Date: ____________ Movements: ____________ Start time: ____________ Finish time: ____________  Date: ____________ Movements: ____________ Start time: ____________ Finish  time: ____________  Date: ____________ Movements: ____________ Start time: ____________ Finish time: ____________  Date: ____________ Movements: ____________ Start time:   ____________ Finish time: ____________  Date: ____________ Movements: ____________ Start time: ____________ Finish time: ____________  Date: ____________ Movements: ____________ Start time: ____________ Finish time: ____________  Date: ____________ Movements: ____________ Start time: ____________ Finish time: ____________   Date: ____________ Movements: ____________ Start time: ____________ Finish time: ____________  Date: ____________ Movements: ____________ Start time: ____________ Finish time: ____________  Date: ____________ Movements: ____________ Start time: ____________ Finish time: ____________  Date: ____________ Movements: ____________ Start time: ____________ Finish time: ____________  Date: ____________ Movements: ____________ Start time: ____________ Finish time: ____________  Date: ____________ Movements: ____________ Start time: ____________ Finish time: ____________  Date: ____________ Movements: ____________ Start time: ____________ Finish time: ____________   Date: ____________ Movements: ____________ Start time: ____________ Finish time: ____________  Date: ____________ Movements: ____________ Start time: ____________ Finish time: ____________  Date: ____________ Movements: ____________ Start time: ____________ Finish time: ____________  Date: ____________ Movements: ____________ Start time: ____________ Finish time: ____________  Date: ____________ Movements: ____________ Start time: ____________ Finish time: ____________  Date: ____________ Movements: ____________ Start time: ____________ Finish time: ____________  Date: ____________ Movements: ____________ Start time: ____________ Finish time: ____________   Date: ____________ Movements: ____________ Start time: ____________ Finish time: ____________  Date: ____________  Movements: ____________ Start time: ____________ Finish time: ____________  Date: ____________ Movements: ____________ Start time: ____________ Finish time: ____________  Date: ____________ Movements: ____________ Start time: ____________ Finish time: ____________  Date: ____________ Movements: ____________ Start time: ____________ Finish time: ____________  Date: ____________ Movements: ____________ Start time: ____________ Finish time: ____________  Date: ____________ Movements: ____________ Start time: ____________ Finish time: ____________   Date: ____________ Movements: ____________ Start time: ____________ Finish time: ____________  Date: ____________ Movements: ____________ Start time: ____________ Finish time: ____________  Date: ____________ Movements: ____________ Start time: ____________ Finish time: ____________  Date: ____________ Movements: ____________ Start time: ____________ Finish time: ____________  Date: ____________ Movements: ____________ Start time: ____________ Finish time: ____________  Date: ____________ Movements: ____________ Start time: ____________ Finish time: ____________     This information is not intended to replace advice given to you by your health care provider. Make sure you discuss any questions you have with your health care provider.     Document Released: 05/12/2006 Document Revised: 05/03/2014 Document Reviewed: 02/07/2012  Elsevier Interactive Patient Education ©2016 [employer]
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
## 2016-02-12 NOTE — MAU Provider Note (Signed)
Chief Complaint:  Decreased Fetal Movement   First Provider Initiated Contact with Patient 02/12/16 1218      HPI: Cynthia Kent is a 25 y.o. G1P0 at 80w2dwho presents to maternity admissions reporting decreased fetal movement today. She had surgery on her knee 2 days ago in [facility] and was kept in the hospital for 24 hours. She reports they were concerned that the baby was too sleepy on the monitor and she might have to be delivered. A contraction stress test was performed and the tracing became reactive. She came today when she did not feel as much movement as normal. She is feeling movement while in MAU.  Drinking cold fluids has improved her symptom.  She denies any abdominal pain. She denies LOF, vaginal bleeding, vaginal itching/burning, urinary symptoms, h/a, dizziness, n/v, or fever/chills.    HPI  Past Medical History: Past Medical History:  Diagnosis Date  . Acid reflux disease   . ADHD (attention deficit hyperactivity disorder)   . Anxiety   . Bipolar disorder (HCC)   . Depression   . Drug-seeking behavior   . 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 Ovarian cyst   . Sleep apnea     Past obstetric history: OB History  Gravida Para Term Preterm AB Living  1            SAB TAB Ectopic Multiple Live Births               # Outcome Date GA Lbr Len/2nd Weight Sex Delivery Anes PTL Lv  1 Current               Past Surgical History: Past Surgical History:  Procedure Laterality Date  . LEG SURGERY     when patient was 25 years old    Family History: Family History  Problem Relation Age of Onset  . Cancer Neg Hx   . Diabetes Neg Hx     Social History: Social History  Substance Use Topics  . Smoking status: Light Tobacco Smoker    Types: Cigarettes  . Smokeless tobacco: Never Used     Comment: Currently will take "a puff"   . Alcohol use No     Comment: not since pregnancy     Allergies:  Allergies  Allergen Reactions  . Amoxicillin Hives and Other  (See Comments)    Has patient had a PCN reaction causing immediate rash, facial/tongue/throat swelling, SOB or lightheadedness with hypotension: No Has patient had a PCN reaction causing severe rash involving mucus membranes or skin necrosis: No Has patient had a PCN reaction that required hospitalization No Has patient had a PCN reaction occurring within the last 10 years: No If all of the above answers are "NO", then may proceed with Cephalosporin use.  Marland Kitchen Hydrocodone Itching  . Latex Swelling and Rash    Meds:  Prescriptions Prior to Admission  Medication Sig Dispense Refill Last Dose  . cyclobenzaprine (FLEXERIL) 10 MG tablet Take 1 tablet (10 mg total) by mouth 3 (three) times daily as needed for muscle spasms. 30 tablet 0 Past Week at Unknown time  . oxyCODONE-acetaminophen (PERCOCET/ROXICET) 5-325 MG tablet Take 1-2 tablets by mouth every 4 (four) hours as needed for severe pain.    02/12/2016 at Unknown time  . Prenatal MV-Min-FA-Omega-3 (PRENATAL GUMMIES/DHA & FA) 0.4-32.5 MG CHEW Chew 2 each by mouth daily.   Past Week at Unknown time    ROS:  Review of Systems  Constitutional: Negative  for chills, fatigue and fever.  Eyes: Negative for visual disturbance.  Respiratory: Negative for shortness of breath.   Cardiovascular: Negative for chest pain.  Gastrointestinal: Negative for abdominal pain, nausea and vomiting.  Genitourinary: Negative for difficulty urinating, dysuria, flank pain, pelvic pain, vaginal bleeding, vaginal discharge and vaginal pain.  Neurological: Negative for dizziness and headaches.  Psychiatric/Behavioral: Negative.      I have reviewed patient's Past Medical Hx, Surgical Hx, Family Hx, Social Hx, medications and allergies.   Physical Exam  Patient Vitals for the past 24 hrs:  BP Temp Temp src Pulse Resp  02/12/16 1110 126/84 - - 103 18  02/12/16 1037 137/72 98.5 F (36.9 C) Oral 91 18  02/12/16 1036 137/72 - - 91 -   Constitutional:  Well-developed, well-nourished female in no acute distress.  Cardiovascular: normal rate Respiratory: normal effort GI: Abd soft, non-tender, gravid appropriate for gestational age.  MS: Extremities nontender, no edema, normal ROM Neurologic: Alert and oriented x 4.  GU: Neg CVAT.  PELVIC EXAM: Deferred    FHT:  Baseline 135, moderate variability, accelerations present, no decelerations Contractions: rare, 1 in 1 hour in MAU, mild to palpation    MAU Course/MDM: NST reviewed and reactive in MAU Reassurance provided to pt  [facility] Reviewed fetal kick/movement counting and signs of labor/reasons to return to MAU Pt stable at time of discharge.   Assessment: 1. Decreased fetal movements in second trimester, single or unspecified fetus   2. NST (non-stress test) reactive     Plan: Discharge home Labor precautions and fetal kick counts Follow-up Information    [facility] .   Why:  As scheduled, return to MAU as needed for emergencies Contact information: [address] [place] [phone]           Medication List    TAKE these medications   cyclobenzaprine 10 MG tablet Commonly known as:  FLEXERIL Take 1 tablet (10 mg total) by mouth 3 (three) times daily as needed for muscle spasms.   oxyCODONE-acetaminophen 5-325 MG tablet Commonly known as:  PERCOCET/ROXICET Take 1-2 tablets by mouth every 4 (four) hours as needed for severe pain.   PRENATAL GUMMIES/DHA & FA 0.4-32.5 MG Chew Chew 2 each by mouth daily.       Sharen CounterLisa Leftwich-Kirby Certified Nurse-Midwife 02/12/2016 12:36 PM

## 2016-02-16 ENCOUNTER — Ambulatory Visit (INDEPENDENT_AMBULATORY_CARE_PROVIDER_SITE_OTHER): Payer: Medicaid Other | Admitting: Family

## 2016-02-16 DIAGNOSIS — Z369 Encounter for antenatal screening, unspecified: Secondary | ICD-10-CM

## 2016-02-16 DIAGNOSIS — Z3403 Encounter for supervision of normal first pregnancy, third trimester: Secondary | ICD-10-CM
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
## 2016-02-16 NOTE — Patient Instructions (Signed)
AREA PEDIATRIC/FAMILY PRACTICE PHYSICIANS  [facility] [address], [address] [place], [place]  [address] Phone - [phone]   Fax - [phone]  [facility] [address] [place], [place] [address] Phone - [phone]   Fax - [phone]  [facility] [address] [place], [place]  [address] Phone - [phone]   Fax - [phone]  [facility] [address] [place], [place]  [address] Phone - [phone]   Fax - [phone]  [facility] [address] [place], [place]  [address] Phone - [phone]   Fax - [phone]  [facility] [address] [place], [place]  [address] Phone - [phone]   Fax - [phone]  [facility] [address] [place], [place]  [address] Phone - [phone]   Fax - [phone]  [facility] [address] [place], [place]  [address] Phone - [phone]   Fax - [phone]  [facility] [address] [place], [place]  [address] Phone - [phone]   Fax - [phone] [facility] [address] [place], [place]  [address] Phone - [phone]   Fax - [phone]  [facility] [address] [place], [place]  [address] Phone - [phone]   Fax - [phone]  [facility] [address] [place], [place]  [address] Phone - [phone]   Fax - [phone]  [facility] [address] [place], [place]  [address] Phone - [phone]   Fax - [phone]  [facility] GOSR[facility] [address] [place], [place]  [address] Phone - [phone]  [facility] [address] [place], [place]  [address] Phone - [phone]   Fax - [phone]  [facility] [address] [place], [place]  [address] Phone - [phone]   Fax - [phone]  [facility] [address] [place], [place]  [address] Phone - [phone]   Fax - [phone]  [facility] [address] [place], [place]  [address] Phone - [phone]   Fax - [phone]   [facility] [address] [place], [place]  [address] Phone - [phone]   Fax - [phone]  [facility] [address] [place], [place]  [address] Phone - [phone]   Fax - [phone]  DAVID RUBIN [address] [place], [place]  [address] Phone - [phone]   Fax - [phone]  [facility] [address] [place], [place]  [address] Phone - [phone]   Fax - [phone]  [facility] [address] , [place]  [address] Phone - [phone]   Fax - [phone] [facility] [address] [place], [place]  [address] Phone - [phone]   Fax - [phone]  [facility] [address] [place], [place]  [address] Phone - [phone]   Fax - [phone]  [facility] [address] [place],   [address] Phone - [phone]   Fax - [phone]   

## 2016-02-17 ENCOUNTER — Other Ambulatory Visit: Payer: Self-pay | Admitting: Certified Nurse Midwife

## 2016-02-17 DIAGNOSIS — Z3403 Encounter for supervision of normal first pregnancy, third trimester: Secondary | ICD-10-CM

## 2016-02-24 ENCOUNTER — Ambulatory Visit (INDEPENDENT_AMBULATORY_CARE_PROVIDER_SITE_OTHER): Payer: Medicaid Other | Admitting: Obstetrics and Gynecology

## 2016-02-24 VITALS — BP 123/88 | HR 100

## 2016-02-24 DIAGNOSIS — Z3403 Encounter for supervision of normal first pregnancy, third trimester: Secondary | ICD-10-CM

## 2016-02-24 LAB — OB RESULTS CONSOLE GBS: GBS: NEGATIVE

## 2016-02-24 NOTE — Addendum Note (Signed)
Addended by: Marya LandryFOSTER, Destynie Toomey D on: 02/24/2016 03:15 PM   Modules accepted: Orders
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
## 2016-02-24 NOTE — Patient Instructions (Signed)
Vaginal Delivery °During delivery, your health care provider will help you give birth to your baby. During a vaginal delivery, you will work to push the baby out of your vagina. However, before you can push your baby out, a few things need to happen. The opening of your uterus (cervix) has to soften, thin out, and open up (dilate) all the way to 10 cm. Also, your baby has to move down from the uterus into your vagina.  °SIGNS OF LABOR  °Your health care provider will first need to make sure you are in labor. Signs of labor include:  °· Passing what is called the mucous plug before labor begins. This is a small amount of blood-stained mucus. °· Having regular, painful uterine contractions.   °· The time between contractions gets shorter.   °· The discomfort and pain gradually get more intense. °· Contraction pains get worse when walking and do not go away when resting.   °· Your cervix becomes thinner (effacement) and dilates. °BEFORE THE DELIVERY °Once you are in labor and admitted into the hospital or care center, your health care provider may do the following:  °· Perform a complete physical exam. °· Review any complications related to pregnancy or labor.  °· Check your blood pressure, pulse, temperature, and heart rate (vital signs).   °· Determine if, and when, the rupture of amniotic membranes occurred. °· Do a vaginal exam (using a sterile glove and lubricant) to determine:   °¨ The position (presentation) of the baby. Is the baby's head presenting first (vertex) in the birth canal (vagina), or are the feet or buttocks first (breech)?   °¨ The level (station) of the baby's head within the birth canal.   °¨ The effacement and dilatation of the cervix.   °· An electronic fetal monitor is usually placed on your abdomen when you first arrive. This is used to monitor your contractions and the baby's heart rate. °¨ When the monitor is on your abdomen (external fetal monitor), it can only pick up the frequency and  length of your contractions. It cannot tell the strength of your contractions. °¨ If it becomes necessary for your health care provider to know exactly how strong your contractions are or to see exactly what the baby's heart rate is doing, an internal monitor may be inserted into your vagina and uterus. Your health care provider will discuss the benefits and risks of using an internal monitor and obtain your permission before inserting the device. °¨ Continuous fetal monitoring may be needed if you have an epidural, are receiving certain medicines (such as oxytocin), or have pregnancy or labor complications. °· An IV access tube may be placed into a vein in your arm to deliver fluids and medicines if necessary. °THREE STAGES OF LABOR AND DELIVERY °Normal labor and delivery is divided into three stages. °First Stage °This stage starts when you begin to contract regularly and your cervix begins to efface and dilate. It ends when your cervix is completely open (fully dilated). The first stage is the longest stage of labor and can last from 3 hours to 15 hours.  °Several methods are available to help with labor pain. You and your health care provider will decide which option is best for you. Options include:  °· Opioid medicines. These are strong pain medicines that you can get through your IV tube or as a shot into your muscle. These medicines lessen pain but do not make it go away completely.  °· Epidural. A medicine is given through a thin tube that   is inserted in your back. The medicine numbs the lower part of your body and prevents any pain in that area. °· Paracervical pain medicine. This is an injection of an anesthetic on each side of your cervix.   °· You may request natural childbirth, which does not involve the use of pain medicines or an epidural during labor and delivery. Instead, you will use other things, such as breathing exercises, to help cope with the pain. °Second Stage °The second stage of labor  begins when your cervix is fully dilated at 10 cm. It continues until you push your baby down through the birth canal and the baby is born. This stage can take only minutes or several hours. °· The location of your baby's head as it moves through the birth canal is reported as a number called a station. If the baby's head has not started its descent, the station is described as being at minus 3 (-3). When your baby's head is at the zero station, it is at the middle of the birth canal and is engaged in the pelvis. The station of your baby helps indicate the progress of the second stage of labor. °· When your baby is born, your health care provider may hold the baby with his or her head lowered to prevent amniotic fluid, mucus, and blood from getting into the baby's lungs. The baby's mouth and nose may be suctioned with a small bulb syringe to remove any additional fluid. °· Your health care provider may then place the baby on your stomach. It is important to keep the baby from getting cold. To do this, the health care provider will dry the baby off, place the baby directly on your skin (with no blankets between you and the baby), and cover the baby with warm, dry blankets.   °· The umbilical cord is cut. °Third Stage °During the third stage of labor, your health care provider will deliver the placenta (afterbirth) and make sure your bleeding is under control. The delivery of the placenta usually takes about 5 minutes but can take up to 30 minutes. After the placenta is delivered, a medicine may be given either by IV or injection to help contract the uterus and control bleeding. If you are planning to breastfeed, you can try to do so now. °After you deliver the placenta, your uterus should contract and get very firm. If your uterus does not remain firm, your health care provider will massage it. This is important because the contraction of the uterus helps cut off bleeding at the site where the placenta was attached  to your uterus. If your uterus does not contract properly and stay firm, you may continue to bleed heavily. If there is a lot of bleeding, medicines may be given to contract the uterus and stop the bleeding.  °  °This information is not intended to replace advice given to you by your health care provider. Make sure you discuss any questions you have with your health care provider. °  °Document Released: 01/20/2008 Document Revised: 05/03/2014 Document Reviewed: 12/08/2011 °Elsevier Interactive Patient Education ©2016 [employer] Inc[employer] ° °

## 2016-02-28 LAB — STREP GP B CULTURE+RFLX: Strep Gp B Culture+Rflx: NEGATIVE

## 2016-03-02 ENCOUNTER — Ambulatory Visit (INDEPENDENT_AMBULATORY_CARE_PROVIDER_SITE_OTHER): Payer: Medicaid Other | Admitting: Obstetrics and Gynecology

## 2016-03-02 DIAGNOSIS — Z3403 Encounter for supervision of normal first pregnancy, third trimester: Secondary | ICD-10-CM
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
## 2016-03-02 NOTE — Patient Instructions (Signed)
Vaginal Delivery °During delivery, your health care provider will help you give birth to your baby. During a vaginal delivery, you will work to push the baby out of your vagina. However, before you can push your baby out, a few things need to happen. The opening of your uterus (cervix) has to soften, thin out, and open up (dilate) all the way to 10 cm. Also, your baby has to move down from the uterus into your vagina.  °SIGNS OF LABOR  °Your health care provider will first need to make sure you are in labor. Signs of labor include:  °· Passing what is called the mucous plug before labor begins. This is a small amount of blood-stained mucus. °· Having regular, painful uterine contractions.   °· The time between contractions gets shorter.   °· The discomfort and pain gradually get more intense. °· Contraction pains get worse when walking and do not go away when resting.   °· Your cervix becomes thinner (effacement) and dilates. °BEFORE THE DELIVERY °Once you are in labor and admitted into the hospital or care center, your health care provider may do the following:  °· Perform a complete physical exam. °· Review any complications related to pregnancy or labor.  °· Check your blood pressure, pulse, temperature, and heart rate (vital signs).   °· Determine if, and when, the rupture of amniotic membranes occurred. °· Do a vaginal exam (using a sterile glove and lubricant) to determine:   °¨ The position (presentation) of the baby. Is the baby's head presenting first (vertex) in the birth canal (vagina), or are the feet or buttocks first (breech)?   °¨ The level (station) of the baby's head within the birth canal.   °¨ The effacement and dilatation of the cervix.   °· An electronic fetal monitor is usually placed on your abdomen when you first arrive. This is used to monitor your contractions and the baby's heart rate. °¨ When the monitor is on your abdomen (external fetal monitor), it can only pick up the frequency and  length of your contractions. It cannot tell the strength of your contractions. °¨ If it becomes necessary for your health care provider to know exactly how strong your contractions are or to see exactly what the baby's heart rate is doing, an internal monitor may be inserted into your vagina and uterus. Your health care provider will discuss the benefits and risks of using an internal monitor and obtain your permission before inserting the device. °¨ Continuous fetal monitoring may be needed if you have an epidural, are receiving certain medicines (such as oxytocin), or have pregnancy or labor complications. °· An IV access tube may be placed into a vein in your arm to deliver fluids and medicines if necessary. °THREE STAGES OF LABOR AND DELIVERY °Normal labor and delivery is divided into three stages. °First Stage °This stage starts when you begin to contract regularly and your cervix begins to efface and dilate. It ends when your cervix is completely open (fully dilated). The first stage is the longest stage of labor and can last from 3 hours to 15 hours.  °Several methods are available to help with labor pain. You and your health care provider will decide which option is best for you. Options include:  °· Opioid medicines. These are strong pain medicines that you can get through your IV tube or as a shot into your muscle. These medicines lessen pain but do not make it go away completely.  °· Epidural. A medicine is given through a thin tube that   is inserted in your back. The medicine numbs the lower part of your body and prevents any pain in that area. °· Paracervical pain medicine. This is an injection of an anesthetic on each side of your cervix.   °· You may request natural childbirth, which does not involve the use of pain medicines or an epidural during labor and delivery. Instead, you will use other things, such as breathing exercises, to help cope with the pain. °Second Stage °The second stage of labor  begins when your cervix is fully dilated at 10 cm. It continues until you push your baby down through the birth canal and the baby is born. This stage can take only minutes or several hours. °· The location of your baby's head as it moves through the birth canal is reported as a number called a station. If the baby's head has not started its descent, the station is described as being at minus 3 (-3). When your baby's head is at the zero station, it is at the middle of the birth canal and is engaged in the pelvis. The station of your baby helps indicate the progress of the second stage of labor. °· When your baby is born, your health care provider may hold the baby with his or her head lowered to prevent amniotic fluid, mucus, and blood from getting into the baby's lungs. The baby's mouth and nose may be suctioned with a small bulb syringe to remove any additional fluid. °· Your health care provider may then place the baby on your stomach. It is important to keep the baby from getting cold. To do this, the health care provider will dry the baby off, place the baby directly on your skin (with no blankets between you and the baby), and cover the baby with warm, dry blankets.   °· The umbilical cord is cut. °Third Stage °During the third stage of labor, your health care provider will deliver the placenta (afterbirth) and make sure your bleeding is under control. The delivery of the placenta usually takes about 5 minutes but can take up to 30 minutes. After the placenta is delivered, a medicine may be given either by IV or injection to help contract the uterus and control bleeding. If you are planning to breastfeed, you can try to do so now. °After you deliver the placenta, your uterus should contract and get very firm. If your uterus does not remain firm, your health care provider will massage it. This is important because the contraction of the uterus helps cut off bleeding at the site where the placenta was attached  to your uterus. If your uterus does not contract properly and stay firm, you may continue to bleed heavily. If there is a lot of bleeding, medicines may be given to contract the uterus and stop the bleeding.  °  °This information is not intended to replace advice given to you by your health care provider. Make sure you discuss any questions you have with your health care provider. °  °Document Released: 01/20/2008 Document Revised: 05/03/2014 Document Reviewed: 12/08/2011 °Elsevier Interactive Patient Education ©2016 [employer] Inc[employer] ° °

## 2016-03-02 NOTE — Progress Notes (Signed)
Patient is in the office and states that she feels good.

## 2016-03-09 ENCOUNTER — Ambulatory Visit (INDEPENDENT_AMBULATORY_CARE_PROVIDER_SITE_OTHER): Payer: Medicaid Other | Admitting: Obstetrics & Gynecology

## 2016-03-09 VITALS — BP 124/83 | HR 99 | Temp 99.0°F | Wt 215.2 lb

## 2016-03-09 DIAGNOSIS — Z3403 Encounter for supervision of normal first pregnancy, third trimester: Secondary | ICD-10-CM
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
## 2016-03-09 NOTE — Patient Instructions (Signed)

## 2016-03-09 NOTE — Progress Notes (Signed)
Patient stated that she thinks her mucous plug has started to come out, denies contractions and reports good fetal movement.

## 2016-03-17 ENCOUNTER — Ambulatory Visit (INDEPENDENT_AMBULATORY_CARE_PROVIDER_SITE_OTHER): Payer: Medicaid Other | Admitting: Certified Nurse Midwife

## 2016-03-17 VITALS — BP 133/76 | HR 93 | Wt 225.7 lb

## 2016-03-17 DIAGNOSIS — Z3403 Encounter for supervision of normal first pregnancy, third trimester: Secondary | ICD-10-CM

## 2016-03-17 DIAGNOSIS — Z87898 Personal history of other specified conditions: Secondary | ICD-10-CM

## 2016-03-17 DIAGNOSIS — F1291 Cannabis use, unspecified, in remission: Secondary | ICD-10-CM

## 2016-03-17 DIAGNOSIS — R0683 Snoring: Secondary | ICD-10-CM
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
## 2016-03-17 NOTE — Patient Instructions (Signed)
Thinking About Waterbirth???  You must attend a Waterbirth class at [facility]  3rd Wednesday of every month from 7-9pm  Free  Register by calling [phone] or online at [email or web address]  Bring us the certificate from the class  Waterbirth supplies needed for [facility] Department patients:  Our practice has a Birth Pool in a Box tub at the hospital that you can borrow  You will need to purchase an accessory kit that has all needed supplies through [facility] ([phone]) or online $175.00  Or you can purchase the supplies separately: o Single-use disposable tub liner for Birth Pool in a Box (REGULAR size) o New garden hose labeled "lead-free", "suitable for drinking water", o Electric drain pump to remove water (We recommend 792 gallon per hour or greater pump.)  o  "non-toxic" OR "water potable" o Garden hose to remove the dirty water o Fish net o Bathing suit top (optional) o Long-handled mirror (optional)  [employer] sells tubs for ~ $120 if you would rather purchase your own tub.  They also sell accessories, liners.    Www.waterbirthsolutions.com for tub purchases and supplies  [employer] (www.thelaborladies.com) $275 for tub rental/set-up & take down/kit   [employer] [employer] information regarding doulas (labor support) who provide pool rentals:  Http://www.padanc.org/MeetUs.htm   [employer] (www.thelaborladies.com)  Http://www.padanc.org/MeetUs.htm   Things that would prevent you from having a waterbirth:  Premature, <37wks  Previous cesarean birth  Presence of thick meconium-stained fluid  Multiple gestation (Twins, triplets, etc.)  Uncontrolled diabetes or gestational diabetes requiring medication  Hypertension  Heavy vaginal bleeding  Non-reassuring fetal heart rate  Active infection (MRSA, etc.)  If your labor has to be induced and induction method requires continuous  monitoring of the baby's heart rate  Other risks/issues identified by your obstetrical provider   

## 2016-03-22 ENCOUNTER — Encounter (HOSPITAL_COMMUNITY): Payer: Self-pay | Admitting: *Deleted

## 2016-03-22 ENCOUNTER — Inpatient Hospital Stay (HOSPITAL_COMMUNITY)
Admission: AD | Admit: 2016-03-22 | Discharge: 2016-03-22 | Disposition: A | Discharge status: Home / Self Care | Payer: Medicaid Other | Source: Ambulatory Visit | Attending: Obstetrics and Gynecology | Admitting: Obstetrics and Gynecology

## 2016-03-22 DIAGNOSIS — Z3401 Encounter for supervision of normal first pregnancy, first trimester: Secondary | ICD-10-CM | POA: Insufficient documentation

## 2016-03-22 DIAGNOSIS — Z3A01 Less than 8 weeks gestation of pregnancy: Secondary | ICD-10-CM | POA: Insufficient documentation

## 2016-03-22 DIAGNOSIS — Z3400 Encounter for supervision of normal first pregnancy, unspecified trimester: Secondary | ICD-10-CM | POA: Diagnosis present

## 2016-03-22 NOTE — Discharge Instructions (Signed)
Braxton Hicks Contractions Contractions of the uterus can occur throughout pregnancy. Contractions are not always a sign that you are in labor.  WHAT ARE BRAXTON HICKS CONTRACTIONS?  Contractions that occur before labor are called Braxton Hicks contractions, or false labor. Toward the end of pregnancy (32-34 weeks), these contractions can develop more often and may become more forceful. This is not true labor because these contractions do not result in opening (dilatation) and thinning of the cervix. They are sometimes difficult to tell apart from true labor because these contractions can be forceful and people have different pain tolerances. You should not feel embarrassed if you go to the hospital with false labor. Sometimes, the only way to tell if you are in true labor is for your health care provider to look for changes in the cervix. If there are no prenatal problems or other health problems associated with the pregnancy, it is completely safe to be sent home with false labor and await the onset of true labor. HOW CAN YOU TELL THE DIFFERENCE BETWEEN TRUE AND FALSE LABOR? False Labor   The contractions of false labor are usually shorter and not as hard as those of true labor.   The contractions are usually irregular.   The contractions are often felt in the front of the lower abdomen and in the groin.   The contractions may go away when you walk around or change positions while lying down.   The contractions get weaker and are shorter lasting as time goes on.   The contractions do not usually become progressively stronger, regular, and closer together as with true labor.  True Labor   Contractions in true labor last 30-70 seconds, become very regular, usually become more intense, and increase in frequency.   The contractions do not go away with walking.   The discomfort is usually felt in the top of the uterus and spreads to the lower abdomen and low back.   True labor can be  determined by your health care provider with an exam. This will show that the cervix is dilating and getting thinner.  WHAT TO REMEMBER  Keep up with your usual exercises and follow other instructions given by your health care provider.   Take medicines as directed by your health care provider.   Keep your regular prenatal appointments.   Eat and drink lightly if you think you are going into labor.   If Braxton Hicks contractions are making you uncomfortable:   Change your position from lying down or resting to walking, or from walking to resting.   Sit and rest in a tub of warm water.   Drink 2-3 glasses of water. Dehydration may cause these contractions.   Do slow and deep breathing several times an hour.  WHEN SHOULD I SEEK IMMEDIATE MEDICAL CARE? Seek immediate medical care if:  Your contractions become stronger, more regular, and closer together.   You have fluid leaking or gushing from your vagina.   You have a fever.   You pass blood-tinged mucus.   You have vaginal bleeding.   You have continuous abdominal pain.   You have low back pain that you never had before.   You feel your baby's head pushing down and causing pelvic pressure.   Your baby is not moving as much as it used to.  This information is not intended to replace advice given to you by your health care provider. Make sure you discuss any questions you have with your health care   provider. Document Released: 04/12/2005 Document Revised: 08/04/2015 Document Reviewed: 01/22/2013 Elsevier Interactive Patient Education  2017 Elsevier Inc. Introduction Patient Name: ________________________________________________ Patient Due Date: ____________________ What is a fetal movement count? A fetal movement count is the number of times that you feel your baby move during a certain amount of time. This may also be called a fetal kick count. A fetal movement count is recommended for every pregnant  woman. You may be asked to start counting fetal movements as early as week 28 of your pregnancy. Pay attention to when your baby is most active. You may notice your baby's sleep and wake cycles. You may also notice things that make your baby move more. You should do a fetal movement count:  When your baby is normally most active.  At the same time each day. A good time to count movements is while you are resting, after having something to eat and drink. How do I count fetal movements? 1. Find a quiet, comfortable area. Sit, or lie down on your side. 2. Write down the date, the start time and stop time, and the number of movements that you felt between those two times. Take this information with you to your health care visits. 3. For 2 hours, count kicks, flutters, swishes, rolls, and jabs. You should feel at least 10 movements during 2 hours. 4. You may stop counting after you have felt 10 movements. 5. If you do not feel 10 movements in 2 hours, have something to eat and drink. Then, keep resting and counting for 1 hour. If you feel at least 4 movements during that hour, you may stop counting. Contact a health care provider if:  You feel fewer than 4 movements in 2 hours.  Your baby is not moving like he or she usually does. Date: ____________ Start time: ____________ Stop time: ____________ Movements: ____________ Date: ____________ Start time: ____________ Stop time: ____________ Movements: ____________ Date: ____________ Start time: ____________ Stop time: ____________ Movements: ____________ Date: ____________ Start time: ____________ Stop time: ____________ Movements: ____________ Date: ____________ Start time: ____________ Stop time: ____________ Movements: ____________ Date: ____________ Start time: ____________ Stop time: ____________ Movements: ____________ Date: ____________ Start time: ____________ Stop time: ____________ Movements: ____________ Date: ____________ Start time:  ____________ Stop time: ____________ Movements: ____________ Date: ____________ Start time: ____________ Stop time: ____________ Movements: ____________ This information is not intended to replace advice given to you by your health care provider. Make sure you discuss any questions you have with your health care provider. Document Released: 05/12/2006 Document Revised: 12/10/2015 Document Reviewed: 05/22/2015 Elsevier Interactive Patient Education  2017 Elsevier Inc.  

## 2016-03-22 NOTE — MAU Note (Signed)
PT  SAYS SHE  WENT  TO HOSPITAL IN [place]  ON  THANKSGIVING- FOR NECK PAIN-     VE   1  CM  AT HOSPITAL.       PNC -  [facility]AMINA-  VE   CLOSED- OK   TO GO OUT   OF  TOWN    PT  IS  SPENDING  NIGHT ON  3RD FLOOR  WITH  COUSINS  BABY -     THINKS  SHE'S  HAVING  UC.      DENIES HSV  AND  MRSA.  GBS- NEG

## 2016-03-24 ENCOUNTER — Ambulatory Visit (INDEPENDENT_AMBULATORY_CARE_PROVIDER_SITE_OTHER): Payer: Medicaid Other | Admitting: Obstetrics and Gynecology

## 2016-03-24 DIAGNOSIS — Z3403 Encounter for supervision of normal first pregnancy, third trimester: Secondary | ICD-10-CM

## 2016-03-24 NOTE — Patient Instructions (Signed)
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
## 2016-03-26 ENCOUNTER — Inpatient Hospital Stay (HOSPITAL_COMMUNITY)
Admission: AD | Admit: 2016-03-26 | Discharge: 2016-03-26 | Disposition: A | Discharge status: Home / Self Care | Payer: Medicaid Other | Source: Ambulatory Visit | Attending: Obstetrics & Gynecology | Admitting: Obstetrics & Gynecology

## 2016-03-26 ENCOUNTER — Encounter (HOSPITAL_COMMUNITY): Payer: Self-pay | Admitting: *Deleted

## 2016-03-26 DIAGNOSIS — Z3401 Encounter for supervision of normal first pregnancy, first trimester: Secondary | ICD-10-CM | POA: Diagnosis present

## 2016-03-26 DIAGNOSIS — Z3A01 Less than 8 weeks gestation of pregnancy: Secondary | ICD-10-CM | POA: Diagnosis not present

## 2016-03-26 LAB — URINALYSIS, ROUTINE W REFLEX MICROSCOPIC
BILIRUBIN URINE: NEGATIVE
Glucose, UA: NEGATIVE mg/dL
HGB URINE DIPSTICK: NEGATIVE
KETONES UR: NEGATIVE mg/dL
Leukocytes, UA: NEGATIVE
NITRITE: NEGATIVE
PROTEIN: NEGATIVE mg/dL
SPECIFIC GRAVITY, URINE: 1.02 (ref 1.005–1.030)
pH: 5.5 (ref 5.0–8.0)

## 2016-03-26 NOTE — MAU Note (Signed)
Pt reports she is feeling cramping in her back off and on allday. Denies any vag bleeding or leaking.  Good fetal movement reported.

## 2016-04-01 ENCOUNTER — Ambulatory Visit (INDEPENDENT_AMBULATORY_CARE_PROVIDER_SITE_OTHER): Payer: Medicaid Other | Admitting: Certified Nurse Midwife

## 2016-04-01 VITALS — BP 127/83 | HR 87 | Wt 231.0 lb

## 2016-04-01 DIAGNOSIS — Z87898 Personal history of other specified conditions: Secondary | ICD-10-CM

## 2016-04-01 DIAGNOSIS — Z3403 Encounter for supervision of normal first pregnancy, third trimester: Secondary | ICD-10-CM | POA: Diagnosis not present

## 2016-04-01 DIAGNOSIS — F1291 Cannabis use, unspecified, in remission: Secondary | ICD-10-CM

## 2016-04-01 DIAGNOSIS — R0683 Snoring: Secondary | ICD-10-CM

## 2016-04-01 NOTE — Addendum Note (Signed)
Addended by: Marya LandryFOSTER, Montavious Wierzba D on: 04/01/2016 04:39 PM   Modules accepted: Orders
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
## 2016-04-04 ENCOUNTER — Inpatient Hospital Stay (HOSPITAL_COMMUNITY)
Admission: AD | Admit: 2016-04-04 | Discharge: 2016-04-09 | DRG: 765 | Disposition: A | Discharge status: Home / Self Care | Payer: Medicaid Other | Source: Ambulatory Visit | Attending: Family Medicine | Admitting: Family Medicine

## 2016-04-04 ENCOUNTER — Encounter (HOSPITAL_COMMUNITY): Payer: Self-pay | Admitting: *Deleted

## 2016-04-04 DIAGNOSIS — O4202 Full-term premature rupture of membranes, onset of labor within 24 hours of rupture: Secondary | ICD-10-CM | POA: Diagnosis present

## 2016-04-04 DIAGNOSIS — O411230 Chorioamnionitis, third trimester, not applicable or unspecified: Secondary | ICD-10-CM | POA: Diagnosis present

## 2016-04-04 DIAGNOSIS — Z87891 Personal history of nicotine dependence: Secondary | ICD-10-CM

## 2016-04-04 DIAGNOSIS — Z3A41 41 weeks gestation of pregnancy: Secondary | ICD-10-CM

## 2016-04-04 DIAGNOSIS — O480 Post-term pregnancy: Secondary | ICD-10-CM

## 2016-04-04 DIAGNOSIS — O4212 Full-term premature rupture of membranes, onset of labor more than 24 hours following rupture: Secondary | ICD-10-CM

## 2016-04-04 LAB — CBC
HCT: 37.1 % (ref 36.0–46.0)
HEMOGLOBIN: 12.5 g/dL (ref 12.0–15.0)
MCH: 28.7 pg (ref 26.0–34.0)
MCHC: 33.7 g/dL (ref 30.0–36.0)
MCV: 85.1 fL (ref 78.0–100.0)
Platelets: 252 10*3/uL (ref 150–400)
RBC: 4.36 MIL/uL (ref 3.87–5.11)
RDW: 14 % (ref 11.5–15.5)
WBC: 9.8 10*3/uL (ref 4.0–10.5)

## 2016-04-04 LAB — TYPE AND SCREEN
ABO/RH(D): O POS
ANTIBODY SCREEN: NEGATIVE

## 2016-04-04 LAB — POCT FERN TEST: POCT FERN TEST: POSITIVE

## 2016-04-04 LAB — ABO/RH: ABO/RH(D): O POS

## 2016-04-04 MED ORDER — OXYCODONE-ACETAMINOPHEN 5-325 MG PO TABS: 2.0000 | ORAL_TABLET | ORAL | Status: DC | PRN

## 2016-04-04 MED ORDER — OXYCODONE-ACETAMINOPHEN 5-325 MG PO TABS: 1.0000 | ORAL_TABLET | ORAL | Status: DC | PRN

## 2016-04-04 MED ORDER — SOD CITRATE-CITRIC ACID 500-334 MG/5ML PO SOLN
30.0000 mL | ORAL | Status: DC | PRN
  Administered 2016-04-06: 30 mL via ORAL
  Filled 2016-04-04: qty 15

## 2016-04-04 MED ORDER — OXYTOCIN 40 UNITS IN LACTATED RINGERS INFUSION - SIMPLE MED
2.5000 [IU]/h | INTRAVENOUS | Status: DC
  Filled 2016-04-04: qty 1000

## 2016-04-04 MED ORDER — FLEET ENEMA 7-19 GM/118ML RE ENEM: 1.0000 | ENEMA | RECTAL | Status: DC | PRN

## 2016-04-04 MED ORDER — ONDANSETRON HCL 4 MG/2ML IJ SOLN
4.0000 mg | Freq: Four times a day (QID) | INTRAMUSCULAR | Status: DC | PRN
  Administered 2016-04-05 (×2): 4 mg via INTRAVENOUS
  Filled 2016-04-04 (×2): qty 2

## 2016-04-04 MED ORDER — OXYTOCIN 40 UNITS IN LACTATED RINGERS INFUSION - SIMPLE MED
1.0000 m[IU]/min | INTRAVENOUS | Status: DC
  Administered 2016-04-05: 2 m[IU]/min via INTRAVENOUS

## 2016-04-04 MED ORDER — OXYTOCIN BOLUS FROM INFUSION: 500.0000 mL | Freq: Once | INTRAVENOUS | Status: DC

## 2016-04-04 MED ORDER — LACTATED RINGERS IV SOLN
500.0000 mL | INTRAVENOUS | Status: DC | PRN
  Administered 2016-04-05: 250 mL via INTRAVENOUS
  Administered 2016-04-05: 500 mL via INTRAVENOUS

## 2016-04-04 MED ORDER — ACETAMINOPHEN 325 MG PO TABS: 650.0000 mg | ORAL_TABLET | ORAL | Status: DC | PRN

## 2016-04-04 MED ORDER — TERBUTALINE SULFATE 1 MG/ML IJ SOLN: 0.2500 mg | Freq: Once | INTRAMUSCULAR | Status: DC | PRN

## 2016-04-04 MED ORDER — LACTATED RINGERS IV SOLN
INTRAVENOUS | Status: DC
  Administered 2016-04-05 – 2016-04-06 (×6): via INTRAVENOUS

## 2016-04-04 MED ORDER — LIDOCAINE HCL (PF) 1 % IJ SOLN
30.0000 mL | INTRAMUSCULAR | Status: DC | PRN
  Filled 2016-04-04: qty 30

## 2016-04-04 NOTE — Progress Notes (Signed)
Cynthia Kent is a 25 y.o. G1P0 at 13103w2d by ultrasound admitted for rupture of membranes  Subjective:   Objective: BP 127/79   Pulse 78   Temp 98.2 F (36.8 C) (Oral)   Resp 18   Ht 5\' 6"  (1.676 m)   Wt 231 lb (104.8 kg)   LMP  (LMP Unknown)   BMI 37.28 kg/m  No intake/output data recorded. No intake/output data recorded.  FHT:  FHR: 135-140 bpm, variability: moderate,  accelerations:  Present,  decelerations:  Absent UC:   rare SVE:   Dilation: 1 Effacement (%): 50 Station: -3 Exam by:: Cynthia Kent, Cynthia Kent  Labs: Lab Results  Component Value Date   WBC 9.8 04/04/2016   HGB 12.5 04/04/2016   HCT 37.1 04/04/2016   MCV 85.1 04/04/2016   PLT 252 04/04/2016    Assessment / Plan: SROM yet to be in labor pt is yet to be in labor  Labor: Yet to be in labor Preeclampsia:  no signs or symptoms of toxicity Fetal Wellbeing:  Category I Pain Control:  Labor support without medications I/D:  n/a Anticipated MOD:  NSVD  Cynthia DuskyMarie Kent 04/04/2016, 2:07 PM

## 2016-04-04 NOTE — Progress Notes (Signed)
Patient ID: Asencion PartridgeDenise Little-Faison, female   DOB: 08-08-1990, 25 y.o.   MRN: [id number]  OB Interim Progress Note  S: Patient is comfortable at this time.  Does not desire any interventions. Family at bedside.    O: BP 128/82   Pulse 86   Temp 98.1 F (36.7 C) (Oral)   Resp 18   Ht 5\' 6"  (1.676 m)   Wt 231 lb (104.8 kg)   LMP  (LMP Unknown)   BMI 37.28 kg/m   FHR Cat I  Dilation: 2 Effacement (%): 70 Cervical Position: Posterior Station: -2 Presentation: Vertex Exam by:: Zerita Boersarlene Shaniece Bussa, CNM  A/P: SROM at 0700, continue expectant management.  Does not desire any interventions at this time.   Not in active labor.   FHT Cat I.  CTX irregular. Anticipate NVSD via water birth.    Freddrick MarchYashika Amin, MD 04/04/2016, 8:30 PM PGY-1

## 2016-04-04 NOTE — Anesthesia Pain Management Evaluation Note (Signed)
  CRNA Pain Management Visit Note  Patient: Cynthia PartridgeDenise Little-Faison, 25 y.o., female   "Hello [facility] am [facility]. We have an anesthesia team available at all times to provide care throughout the hospital, including epidural management and anesthesia for C-section. I don't know your plan for the delivery whether it a natural birth, water birth, IV sedation, nitrous supplementation, doula or epidural, but we want to meet your pain goals."   1.Was your pain managed to your expectations on prior hospitalizations?   No prior hospitalizations  2.What is your expectation for pain management during this hospitalization?     Labor support without medications  3.How can we help you reach that goal? unsure  Record the patient's initial score and the patient's pain goal.   Pain: 0  Pain Goal: 7 [facility] you to be able to say your pain was always managed very well.  Cephus ShellingBURGER,Joshawa Dubin 04/04/2016

## 2016-04-04 NOTE — MAU Note (Signed)
Water broke around 0700, small gush then a lot more.  Clear fluid.  Denies pain/vb.

## 2016-04-04 NOTE — H&P (Signed)
Cynthia Kent is a 25 y.o. female G2 @ 41.2 wks presenting for SROM at 0700.Fluid was clear OB History    Gravida Para Term Preterm AB Living   1             SAB TAB Ectopic Multiple Live Births                 Past Medical History:  Diagnosis Date  . Acid reflux disease   . ADHD (attention deficit hyperactivity disorder)   . Anxiety   . Bipolar disorder (HCC)   . Depression   . Drug-seeking behavior   . 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 Ovarian cyst   . Sleep apnea    Past Surgical History:  Procedure Laterality Date  . LEG SURGERY     when patient was 25 years old   Family History: family history is not on file. Social History:  reports that she has quit smoking. Her smoking use included Cigarettes. She has never used smokeless tobacco. She reports that she does not drink alcohol or use drugs.     Maternal Diabetes: No Genetic Screening: Normal Maternal Ultrasounds/Referrals: Normal Fetal Ultrasounds or other Referrals:  None Maternal Substance Abuse:  No Significant Maternal Medications:  None Significant Maternal Lab Results:  None Other Comments:  None  ROS Maternal Medical History:  Reason for admission: Rupture of membranes.   Contractions: Onset was 3-5 hours ago.   Frequency: rare.   Perceived severity is mild.    Fetal activity: Perceived fetal activity is normal.   Last perceived fetal movement was within the past hour.    Prenatal complications: no prenatal complications Prenatal Complications - Diabetes: none.    Dilation: 1 Effacement (%): 50 Station: -3 Exam by:: A. Gagliardo, RN Blood pressure 132/82, pulse 87, temperature 97.2 F (36.2 C), resp. rate 16. Maternal Exam:  Uterine Assessment: Contraction strength is mild.  Contraction frequency is rare.   Abdomen: Patient reports no abdominal tenderness. Fetal presentation: vertex  Introitus: Normal vulva. Normal vagina.  Ferning test: positive.  Amniotic fluid character:  clear.  Pelvis: adequate for delivery.   Cervix: Cervix evaluated by digital exam.     Fetal Exam Fetal Monitor Review: Mode: ultrasound.   Variability: moderate (6-25 bpm).   Pattern: accelerations present.    Fetal State Assessment: Category I - tracings are normal.     Physical Exam  Constitutional: She is oriented to person, place, and time. She appears well-developed and well-nourished.  HENT:  Head: Normocephalic.  Eyes: Pupils are equal, round, and reactive to light.  Neck: Normal range of motion.  Cardiovascular: Normal rate, regular rhythm, normal heart sounds and intact distal pulses.   Respiratory: Effort normal and breath sounds normal.  GI: Soft. Bowel sounds are normal.  Genitourinary: Vagina normal and uterus normal.  Neurological: She is oriented to person, place, and time. She has normal reflexes.  Psychiatric: She has a normal mood and affect. Her behavior is normal. Judgment and thought content normal.    Prenatal labs: ABO, Rh:   Antibody:   Rubella:   RPR: Non Reactive (09/15 1100)  HBsAg:    HIV: Non Reactive (09/15 1100)  GBS:     Assessment/Plan: SROM at 41.2 wks, no labor, desires water birth. Stable maternal fetal unit. U/S conferms vertex. Will admit and anticipate vag delivery.  Cynthia Kent 04/04/2016, 10:58 AM

## 2016-04-04 NOTE — Progress Notes (Signed)
Cynthia Kent is a 25 y.o. G1P0 at 5943w2d by ultrasound admitted for rupture of membranes  Subjective:   Objective: BP 128/82   Pulse 86   Temp 98.4 F (36.9 C) (Oral)   Resp 18   Ht 5\' 6"  (1.676 m)   Wt 231 lb (104.8 kg)   LMP  (LMP Unknown)   BMI 37.28 kg/m  No intake/output data recorded. No intake/output data recorded.  FHT:  FHR: stable per doppler bpm, variability: moderate,  accelerations:  Present,  decelerations:  Absent UC:   rare SVE:   Dilation: 1.5 Effacement (%): 50 Station: -3 Exam by:: Cynthia Rougeracey Tucker RN  Labs: Lab Results  Component Value Date   WBC 9.8 04/04/2016   HGB 12.5 04/04/2016   HCT 37.1 04/04/2016   MCV 85.1 04/04/2016   PLT 252 04/04/2016    Assessment / Plan: SROM desires no intervention at present time  Labor: yet to be in labor Preeclampsia:  no signs or symptoms of toxicity Fetal Wellbeing:  Category I Pain Control:  Labor support without medications I/D:  n/a Anticipated MOD:  NSVD  Cynthia Kent 04/04/2016, 6:09 PM

## 2016-04-04 NOTE — Progress Notes (Signed)
Pool delivered to room by Midwife, patient's family aware that it is their responsibility to set up, maintain, and break down. Rangel Echeverri TildenBrown Jonna Dittrich, [place] 04/04/2016 5:47 PM

## 2016-04-04 NOTE — Progress Notes (Signed)
Discussed plan of care at bedside with Zerita Boersarlene Lawson, CNM. At this time patient would like to walk and then try birthing tub. Patient desires to be rechecked after laboring in the tub and will reassess at that time. Patient replaced on monitor for NST at this time.

## 2016-04-05 ENCOUNTER — Encounter (HOSPITAL_COMMUNITY): Payer: Self-pay | Admitting: Anesthesiology

## 2016-04-05 ENCOUNTER — Inpatient Hospital Stay (HOSPITAL_COMMUNITY): Payer: Medicaid Other | Admitting: Anesthesiology

## 2016-04-05 ENCOUNTER — Ambulatory Visit (HOSPITAL_COMMUNITY): Payer: Medicaid Other | Attending: Certified Nurse Midwife

## 2016-04-05 LAB — RPR: RPR Ser Ql: NONREACTIVE

## 2016-04-05 MED ORDER — EPHEDRINE 5 MG/ML INJ: 10.0000 mg | INTRAVENOUS | Status: DC | PRN

## 2016-04-05 MED ORDER — PHENYLEPHRINE 40 MCG/ML (10ML) SYRINGE FOR IV PUSH (FOR BLOOD PRESSURE SUPPORT)
80.0000 ug | PREFILLED_SYRINGE | INTRAVENOUS | Status: DC | PRN
Start: 2016-04-05 — End: 2016-04-06
  Filled 2016-04-05: qty 10

## 2016-04-05 MED ORDER — LACTATED RINGERS IV SOLN
INTRAVENOUS | Status: DC
  Administered 2016-04-05 – 2016-04-06 (×3): via INTRAUTERINE

## 2016-04-05 MED ORDER — PHENYLEPHRINE 40 MCG/ML (10ML) SYRINGE FOR IV PUSH (FOR BLOOD PRESSURE SUPPORT): 80.0000 ug | PREFILLED_SYRINGE | INTRAVENOUS | Status: DC | PRN

## 2016-04-05 MED ORDER — FENTANYL CITRATE (PF) 100 MCG/2ML IJ SOLN
100.0000 ug | INTRAMUSCULAR | Status: DC | PRN
  Administered 2016-04-05 (×3): 100 ug via INTRAVENOUS
  Filled 2016-04-05 (×3): qty 2

## 2016-04-05 MED ORDER — GENTAMICIN SULFATE 40 MG/ML IJ SOLN
160.0000 mg | Freq: Once | INTRAVENOUS | Status: AC
  Administered 2016-04-05: 160 mg via INTRAVENOUS
  Filled 2016-04-05: qty 4

## 2016-04-05 MED ORDER — DIPHENHYDRAMINE HCL 50 MG/ML IJ SOLN: 12.5000 mg | INTRAMUSCULAR | Status: DC | PRN

## 2016-04-05 MED ORDER — FENTANYL CITRATE (PF) 100 MCG/2ML IJ SOLN
INTRAMUSCULAR | Status: AC
  Administered 2016-04-05: 100 ug
  Filled 2016-04-05: qty 2

## 2016-04-05 MED ORDER — GENTAMICIN SULFATE 40 MG/ML IJ SOLN
Freq: Three times a day (TID) | INTRAVENOUS | Status: DC
  Administered 2016-04-06 (×2): via INTRAVENOUS
  Filled 2016-04-05 (×2): qty 4

## 2016-04-05 MED ORDER — LACTATED RINGERS IV SOLN: 500.0000 mL | Freq: Once | INTRAVENOUS | Status: DC

## 2016-04-05 MED ORDER — CLINDAMYCIN PHOSPHATE 900 MG/50ML IV SOLN
900.0000 mg | Freq: Three times a day (TID) | INTRAVENOUS | Status: DC
  Administered 2016-04-05: 900 mg via INTRAVENOUS
  Filled 2016-04-05 (×2): qty 50

## 2016-04-05 MED ORDER — CLINDAMYCIN PHOSPHATE 600 MG/50ML IV SOLN
600.0000 mg | Freq: Three times a day (TID) | INTRAVENOUS | Status: DC
  Filled 2016-04-05 (×2): qty 50

## 2016-04-05 MED ORDER — FENTANYL 2.5 MCG/ML BUPIVACAINE 1/10 % EPIDURAL INFUSION (WH - ANES)
14.0000 mL/h | INTRAMUSCULAR | Status: DC | PRN
  Administered 2016-04-05 (×3): 14 mL/h via EPIDURAL
  Filled 2016-04-05 (×3): qty 100

## 2016-04-05 MED ORDER — ACETAMINOPHEN 500 MG PO TABS
1000.0000 mg | ORAL_TABLET | Freq: Once | ORAL | Status: AC
  Administered 2016-04-05: 1000 mg via ORAL
  Filled 2016-04-05: qty 2

## 2016-04-05 MED ORDER — LIDOCAINE HCL (PF) 1 % IJ SOLN
INTRAMUSCULAR | Status: DC | PRN
  Administered 2016-04-05 (×2): 7 mL via EPIDURAL

## 2016-04-05 NOTE — Anesthesia Preprocedure Evaluation (Signed)
Anesthesia Evaluation  Patient identified by MRN, date of birth, ID band Patient awake    Reviewed: Allergy & Precautions, H&P , NPO status , Patient's Chart, lab work & pertinent test results  Airway Mallampati: II  TM Distance: >3 FB Neck ROM: full    Dental no notable dental hx.    Pulmonary former smoker,    Pulmonary exam normal        Cardiovascular negative cardio ROS Normal cardiovascular exam     Neuro/Psych negative neurological ROS     GI/Hepatic Neg liver ROS,   Endo/Other  negative endocrine ROS  Renal/GU negative Renal ROS     Musculoskeletal   Abdominal (+) + obese,   Peds  Hematology negative hematology ROS (+)   Anesthesia Other Findings   Reproductive/Obstetrics (+) Pregnancy                             Anesthesia Physical Anesthesia Plan  ASA: II  Anesthesia Plan: Epidural   Post-op Pain Management:    Induction:   Airway Management Planned:   Additional Equipment:   Intra-op Plan:   Post-operative Plan:   Informed Consent: I have reviewed the patients History and Physical, chart, labs and discussed the procedure including the risks, benefits and alternatives for the proposed anesthesia with the patient or authorized representative who has indicated his/her understanding and acceptance.     Plan Discussed with:   Anesthesia Plan Comments:         Anesthesia Quick Evaluation

## 2016-04-05 NOTE — Anesthesia Procedure Notes (Signed)
Epidural Patient location during procedure: OB Start time: 04/05/2016 7:27 AM End time: 04/05/2016 7:31 AM  Staffing Anesthesiologist: Leilani AbleHATCHETT, Marvel Sapp Performed: anesthesiologist   Preanesthetic Checklist Completed: patient identified, surgical consent, pre-op evaluation, timeout performed, IV checked, risks and benefits discussed and monitors and equipment checked  Epidural Patient position: sitting Prep: site prepped and draped and DuraPrep Patient monitoring: continuous pulse ox and blood pressure Approach: midline Location: L3-L4 Injection technique: LOR air  Needle:  Needle type: Tuohy  Needle gauge: 17 G Needle length: 9 cm and 9 Needle insertion depth: 6 cm Catheter type: closed end flexible Catheter size: 19 Gauge Catheter at skin depth: 11 cm Test dose: negative and Other  Assessment Sensory level: T9 Events: blood not aspirated, injection not painful, no injection resistance, negative IV test and no paresthesia  Additional Notes Reason for block:procedure for pain

## 2016-04-05 NOTE — H&P (Signed)
Pharmacy Antibiotic Note  Cynthia PartridgeDenise Kent is a 25 y.o. female admitted on 04/04/2016 for induction of labor.  Pt now has fever and suspected Triple I.  Pharmacy has been consulted for gentamicin dosing.  Plan: Gentamicin 160 mg IV Q8hr Check SCr if gentamicin continues post partum  Height: 5\' 6"  (167.6 cm) Weight: 231 lb (104.8 kg) IBW/kg (Calculated) : 59.3 kg Dosing Weight: 73 kg   Temp (24hrs), Avg:98.7 F (37.1 C), Min:98.1 F (36.7 C), Max:100.8 F (38.2 C)   Recent Labs Lab 04/04/16 1025  WBC 9.8    CrCl cannot be calculated (Patient's most recent lab result is older than the maximum 21 days allowed.).  Using an estimated SCr of 0.8 the estimated CrCl = 100 ml/min   Allergies  Allergen Reactions  . Amoxicillin Hives and Other (See Comments)    Has patient had a PCN reaction causing immediate rash, facial/tongue/throat swelling, SOB or lightheadedness with hypotension: No Has patient had a PCN reaction causing severe rash involving mucus membranes or skin necrosis: No Has patient had a PCN reaction that required hospitalization No Has patient had a PCN reaction occurring within the last 10 years: No If all of the above answers are "NO", then may proceed with Cephalosporin use.  Marland Kitchen. Hydrocodone Itching  . Latex Swelling and Rash    Antimicrobials this admission: Clindamycin 12/11 >>  Gentamicin 12/11 >>    Thank you for allowing pharmacy to be a part of this patient's care.  Cynthia Kent, Cynthia Kent 04/05/2016 4:56 PM

## 2016-04-05 NOTE — Progress Notes (Signed)
Patient ID: Cynthia PartridgeDenise Kent, female   DOB: 1990/07/02, 25 y.o.   MRN: [id number]   OB Interim Progress Note  S:patient comfortable at this time.  Has an epidural.  Family at bedside.   O: BP 123/72   Pulse 82   Temp 99.1 F (37.3 C) (Oral)   Resp 19   Ht 5\' 6"  (1.676 m)   Wt 231 lb (104.8 kg)   LMP  (LMP Unknown)   SpO2 100%   BMI 37.28 kg/m    FHT: baseline 150s, moderate variability, +accels, no decels CTX Q2 min.  Cervical check 9.5 cm.    Dilation: Lip/rim Effacement (%): 100 Cervical Position: Middle Station: -1 Presentation: Vertex Exam by:: Rolene Arbouresmaine Lewis, RN  A/P: Continue Pitocin.   Anticipate SVD.   Freddrick MarchYashika Amin, MD 04/05/2016, 11:02 PM PGY-1  I have seen and examined this patient and I agree with the above. Cam HaiSHAW, Trevaris Pennella 12:39 AM 04/06/2016

## 2016-04-05 NOTE — Progress Notes (Cosign Needed)
Notified by nursing that patient has temp of 100.6. Will start gentamycin per pharmacy, and clinda 900mg  q8 for triple-I.

## 2016-04-05 NOTE — Progress Notes (Signed)
Patient seen and evaluated. No complaints. Strip reviewed. Prolonged deceleration at approximately 830 after epidural. Piotcin was turned off at that time. Cervix not drastically changed. Will restart pitocin at 6 at this time. Tracing with acceleration and good variability.

## 2016-04-05 NOTE — Progress Notes (Signed)
Patient seen. Doing well. Recurrent decelerations noted. Cervix 6/90/-1. Will IUPC placed and amnio-infusion started. FESE placed. Reposition. Will continue to monitor.

## 2016-04-05 NOTE — Anesthesia Pain Management Evaluation Note (Signed)
  CRNA Pain Management Visit Note  Patient: Cynthia Kent, 25 y.o., female  "Hello I am a member of the anesthesia team at [facility]. We have an anesthesia team available at all times to provide care throughout the hospital, including epidural management and anesthesia for C-section. I don't know your plan for the delivery whether it a natural birth, water birth, IV sedation, nitrous supplementation, doula or epidural, but we want to meet your pain goals."   1.Was your pain managed to your expectations on prior hospitalizations?   No prior hospitalizations  2.What is your expectation for pain management during this hospitalization?     Epidural  3.How can we help you reach that goal? Epidural  Record the patient's initial score and the patient's pain goal.   Pain: 6  Pain Goal: 6 [facility] wants you to be able to say your pain was always managed very well.  Cynthia Kent 04/05/2016

## 2016-04-05 NOTE — Progress Notes (Signed)
S: Patient seen & examined for progress of labor. Patient comfortable with epidural.    O:  Vitals:   04/05/16 1600 04/05/16 1630 04/05/16 1700 04/05/16 1730  BP: (!) 109/58 129/79 126/68 (!) 120/54  Pulse: 93 97 92 (!) 101  Resp: 20     Temp:   98.9 F (37.2 C)   TempSrc:   Oral   SpO2:      Weight:      Height:        Dilation: 9 Effacement (%): 100 Cervical Position: Middle Station: -1 Presentation: Vertex Exam by:: Cher NakaiKristin McLeod RN   FHT: Cat I tracing TOCO: q3-174min   A/P: Continue expectant management Anticipate SVD  Sheria LangNoah Wallace,DO PGY-3

## 2016-04-06 ENCOUNTER — Encounter (HOSPITAL_COMMUNITY): Payer: Self-pay

## 2016-04-06 ENCOUNTER — Encounter (HOSPITAL_COMMUNITY)
Admission: AD | Disposition: A | Discharge status: Home / Self Care | Payer: Self-pay | Source: Ambulatory Visit | Attending: Family Medicine

## 2016-04-06 DIAGNOSIS — Z3A41 41 weeks gestation of pregnancy: Secondary | ICD-10-CM

## 2016-04-06 DIAGNOSIS — O411230 Chorioamnionitis, third trimester, not applicable or unspecified: Secondary | ICD-10-CM

## 2016-04-06 DIAGNOSIS — O4212 Full-term premature rupture of membranes, onset of labor more than 24 hours following rupture: Secondary | ICD-10-CM

## 2016-04-06 SURGERY — Surgical Case
Anesthesia: Epidural

## 2016-04-06 MED ORDER — OXYCODONE-ACETAMINOPHEN 5-325 MG PO TABS
2.0000 | ORAL_TABLET | ORAL | Status: DC | PRN
  Administered 2016-04-07 – 2016-04-09 (×5): 2 via ORAL
  Filled 2016-04-06 (×4): qty 2

## 2016-04-06 MED ORDER — PRENATAL MULTIVITAMIN CH
1.0000 | ORAL_TABLET | Freq: Every day | ORAL | Status: DC
  Filled 2016-04-06: qty 1

## 2016-04-06 MED ORDER — MENTHOL 3 MG MT LOZG: 1.0000 | LOZENGE | OROMUCOSAL | Status: DC | PRN

## 2016-04-06 MED ORDER — NALBUPHINE HCL 10 MG/ML IJ SOLN: 5.0000 mg | Freq: Once | INTRAMUSCULAR | Status: DC | PRN

## 2016-04-06 MED ORDER — PROMETHAZINE HCL 25 MG/ML IJ SOLN: 6.2500 mg | INTRAMUSCULAR | Status: DC | PRN

## 2016-04-06 MED ORDER — OXYCODONE-ACETAMINOPHEN 5-325 MG PO TABS
1.0000 | ORAL_TABLET | ORAL | Status: DC | PRN
  Administered 2016-04-06 – 2016-04-09 (×3): 1 via ORAL
  Filled 2016-04-06 (×5): qty 1

## 2016-04-06 MED ORDER — MORPHINE SULFATE (PF) 0.5 MG/ML IJ SOLN
INTRAMUSCULAR | Status: AC
  Filled 2016-04-06: qty 10

## 2016-04-06 MED ORDER — ONDANSETRON HCL 4 MG/2ML IJ SOLN
INTRAMUSCULAR | Status: AC
  Filled 2016-04-06: qty 4

## 2016-04-06 MED ORDER — CLINDAMYCIN PHOSPHATE 900 MG/50ML IV SOLN: 900.0000 mg | Freq: Three times a day (TID) | INTRAVENOUS | Status: DC

## 2016-04-06 MED ORDER — DIPHENHYDRAMINE HCL 25 MG PO CAPS: 25.0000 mg | ORAL_CAPSULE | Freq: Four times a day (QID) | ORAL | Status: DC | PRN

## 2016-04-06 MED ORDER — WITCH HAZEL-GLYCERIN EX PADS: 1.0000 "application " | MEDICATED_PAD | CUTANEOUS | Status: DC | PRN

## 2016-04-06 MED ORDER — SIMETHICONE 80 MG PO CHEW
80.0000 mg | CHEWABLE_TABLET | Freq: Three times a day (TID) | ORAL | Status: DC
  Administered 2016-04-06 – 2016-04-09 (×9): 80 mg via ORAL
  Filled 2016-04-06 (×9): qty 1

## 2016-04-06 MED ORDER — GENTAMICIN SULFATE 40 MG/ML IJ SOLN
Freq: Three times a day (TID) | INTRAMUSCULAR | Status: AC
  Administered 2016-04-06 – 2016-04-07 (×3): via INTRAVENOUS
  Filled 2016-04-06 (×3): qty 4

## 2016-04-06 MED ORDER — OXYTOCIN 40 UNITS IN LACTATED RINGERS INFUSION - SIMPLE MED: 2.5000 [IU]/h | INTRAVENOUS | Status: AC

## 2016-04-06 MED ORDER — NALOXONE HCL 0.4 MG/ML IJ SOLN: 0.4000 mg | INTRAMUSCULAR | Status: DC | PRN

## 2016-04-06 MED ORDER — DEXAMETHASONE SODIUM PHOSPHATE 10 MG/ML IJ SOLN
INTRAMUSCULAR | Status: DC | PRN
  Administered 2016-04-06: 10 mg via INTRAVENOUS

## 2016-04-06 MED ORDER — LIDOCAINE-EPINEPHRINE (PF) 2 %-1:200000 IJ SOLN
INTRAMUSCULAR | Status: AC
  Filled 2016-04-06: qty 20

## 2016-04-06 MED ORDER — MEPERIDINE HCL 25 MG/ML IJ SOLN: 6.2500 mg | INTRAMUSCULAR | Status: DC | PRN

## 2016-04-06 MED ORDER — ZOLPIDEM TARTRATE 5 MG PO TABS: 5.0000 mg | ORAL_TABLET | Freq: Every evening | ORAL | Status: DC | PRN

## 2016-04-06 MED ORDER — DIPHENHYDRAMINE HCL 25 MG PO CAPS: 25.0000 mg | ORAL_CAPSULE | ORAL | Status: DC | PRN

## 2016-04-06 MED ORDER — LIDOCAINE-EPINEPHRINE (PF) 2 %-1:200000 IJ SOLN
INTRAMUSCULAR | Status: DC | PRN
  Administered 2016-04-06 (×3): 5 mL via EPIDURAL

## 2016-04-06 MED ORDER — NALBUPHINE HCL 10 MG/ML IJ SOLN
5.0000 mg | INTRAMUSCULAR | Status: DC | PRN
  Administered 2016-04-06: 5 mg via INTRAVENOUS
  Filled 2016-04-06: qty 1

## 2016-04-06 MED ORDER — SENNOSIDES-DOCUSATE SODIUM 8.6-50 MG PO TABS
2.0000 | ORAL_TABLET | ORAL | Status: DC
  Administered 2016-04-06 – 2016-04-08 (×3): 2 via ORAL
  Filled 2016-04-06 (×3): qty 2

## 2016-04-06 MED ORDER — OXYTOCIN 10 UNIT/ML IJ SOLN
INTRAMUSCULAR | Status: AC
  Filled 2016-04-06: qty 4

## 2016-04-06 MED ORDER — KETOROLAC TROMETHAMINE 30 MG/ML IJ SOLN
30.0000 mg | Freq: Four times a day (QID) | INTRAMUSCULAR | Status: AC | PRN
  Administered 2016-04-06: 30 mg via INTRAMUSCULAR

## 2016-04-06 MED ORDER — SIMETHICONE 80 MG PO CHEW: 80.0000 mg | CHEWABLE_TABLET | ORAL | Status: DC | PRN

## 2016-04-06 MED ORDER — NALBUPHINE HCL 10 MG/ML IJ SOLN: 5.0000 mg | INTRAMUSCULAR | Status: DC | PRN

## 2016-04-06 MED ORDER — DIPHENHYDRAMINE HCL 50 MG/ML IJ SOLN
12.5000 mg | INTRAMUSCULAR | Status: DC | PRN
  Administered 2016-04-06: 12.5 mg via INTRAVENOUS
  Filled 2016-04-06: qty 1

## 2016-04-06 MED ORDER — KETOROLAC TROMETHAMINE 30 MG/ML IJ SOLN: 30.0000 mg | Freq: Four times a day (QID) | INTRAMUSCULAR | Status: AC | PRN

## 2016-04-06 MED ORDER — LACTATED RINGERS IV SOLN
INTRAVENOUS | Status: DC
  Administered 2016-04-06: 125 mL/h via INTRAVENOUS

## 2016-04-06 MED ORDER — OXYTOCIN 10 UNIT/ML IJ SOLN
INTRAVENOUS | Status: DC | PRN
  Administered 2016-04-06: 40 [IU] via INTRAVENOUS

## 2016-04-06 MED ORDER — COCONUT OIL OIL: 1.0000 "application " | TOPICAL_OIL | Status: DC | PRN

## 2016-04-06 MED ORDER — TETANUS-DIPHTH-ACELL PERTUSSIS 5-2.5-18.5 LF-MCG/0.5 IM SUSP: 0.5000 mL | Freq: Once | INTRAMUSCULAR | Status: DC

## 2016-04-06 MED ORDER — DIBUCAINE 1 % RE OINT: 1.0000 "application " | TOPICAL_OINTMENT | RECTAL | Status: DC | PRN

## 2016-04-06 MED ORDER — ACETAMINOPHEN 325 MG PO TABS: 650.0000 mg | ORAL_TABLET | ORAL | Status: DC | PRN

## 2016-04-06 MED ORDER — DEXAMETHASONE SODIUM PHOSPHATE 10 MG/ML IJ SOLN
INTRAMUSCULAR | Status: AC
  Filled 2016-04-06: qty 1

## 2016-04-06 MED ORDER — ONDANSETRON HCL 4 MG/2ML IJ SOLN: 4.0000 mg | Freq: Three times a day (TID) | INTRAMUSCULAR | Status: DC | PRN

## 2016-04-06 MED ORDER — NALOXONE HCL 2 MG/2ML IJ SOSY
1.0000 ug/kg/h | PREFILLED_SYRINGE | INTRAVENOUS | Status: DC | PRN
  Filled 2016-04-06: qty 2

## 2016-04-06 MED ORDER — HYDROMORPHONE HCL 1 MG/ML IJ SOLN: 0.2500 mg | INTRAMUSCULAR | Status: DC | PRN

## 2016-04-06 MED ORDER — KETOROLAC TROMETHAMINE 30 MG/ML IJ SOLN
INTRAMUSCULAR | Status: AC
  Filled 2016-04-06: qty 1

## 2016-04-06 MED ORDER — SCOPOLAMINE 1 MG/3DAYS TD PT72
1.0000 | MEDICATED_PATCH | Freq: Once | TRANSDERMAL | Status: DC
  Filled 2016-04-06: qty 1

## 2016-04-06 MED ORDER — SIMETHICONE 80 MG PO CHEW
80.0000 mg | CHEWABLE_TABLET | ORAL | Status: DC
  Administered 2016-04-06 – 2016-04-08 (×3): 80 mg via ORAL
  Filled 2016-04-06 (×3): qty 1

## 2016-04-06 MED ORDER — ONDANSETRON HCL 4 MG/2ML IJ SOLN
INTRAMUSCULAR | Status: DC | PRN
  Administered 2016-04-06: 4 mg via INTRAVENOUS

## 2016-04-06 MED ORDER — MORPHINE SULFATE-NACL 0.5-0.9 MG/ML-% IV SOSY
PREFILLED_SYRINGE | INTRAVENOUS | Status: DC | PRN
  Administered 2016-04-06: 4 mg via EPIDURAL
  Administered 2016-04-06: 1 mg via INTRAVENOUS

## 2016-04-06 MED ORDER — IBUPROFEN 600 MG PO TABS
600.0000 mg | ORAL_TABLET | Freq: Four times a day (QID) | ORAL | Status: DC
  Administered 2016-04-06 – 2016-04-09 (×12): 600 mg via ORAL
  Filled 2016-04-06 (×12): qty 1

## 2016-04-06 MED ORDER — SODIUM CHLORIDE 0.9% FLUSH: 3.0000 mL | INTRAVENOUS | Status: DC | PRN

## 2016-04-06 SURGICAL SUPPLY — 32 items
BENZOIN TINCTURE PRP APPL 2/3 (GAUZE/BANDAGES/DRESSINGS) ×3 IMPLANT
CHLORAPREP W/TINT 26ML (MISCELLANEOUS) ×3 IMPLANT
CLAMP CORD UMBIL (MISCELLANEOUS) IMPLANT
CLOSURE WOUND 1/2 X4 (GAUZE/BANDAGES/DRESSINGS) ×1
CLOTH BEACON ORANGE TIMEOUT ST (SAFETY) ×3 IMPLANT
DRSG OPSITE POSTOP 4X10 (GAUZE/BANDAGES/DRESSINGS) ×3 IMPLANT
ELECT REM PT RETURN 9FT ADLT (ELECTROSURGICAL) ×3
ELECTRODE REM PT RTRN 9FT ADLT (ELECTROSURGICAL) ×1 IMPLANT
EXTRACTOR VACUUM M CUP 4 TUBE (SUCTIONS) IMPLANT
EXTRACTOR VACUUM M CUP 4' TUBE (SUCTIONS)
GLOVE BIOGEL PI IND STRL 7.0 (GLOVE) ×2 IMPLANT
GLOVE BIOGEL PI IND STRL 7.5 (GLOVE) ×2 IMPLANT
GLOVE BIOGEL PI INDICATOR 7.0 (GLOVE) ×4
GLOVE BIOGEL PI INDICATOR 7.5 (GLOVE) ×4
GLOVE ECLIPSE 7.5 STRL STRAW (GLOVE) ×3 IMPLANT
GOWN STRL REUS W/TWL LRG LVL3 (GOWN DISPOSABLE) ×9 IMPLANT
KIT ABG SYR 3ML LUER SLIP (SYRINGE) IMPLANT
NEEDLE HYPO 25X5/8 SAFETYGLIDE (NEEDLE) IMPLANT
NS IRRIG 1000ML POUR BTL (IV SOLUTION) ×3 IMPLANT
PACK C SECTION WH (CUSTOM PROCEDURE TRAY) ×3 IMPLANT
PAD ABD 7.5X8 STRL (GAUZE/BANDAGES/DRESSINGS) ×3 IMPLANT
PAD OB MATERNITY 4.3X12.25 (PERSONAL CARE ITEMS) ×3 IMPLANT
PENCIL SMOKE EVAC W/HOLSTER (ELECTROSURGICAL) ×3 IMPLANT
RTRCTR C-SECT PINK 25CM LRG (MISCELLANEOUS) ×3 IMPLANT
STRIP CLOSURE SKIN 1/2X4 (GAUZE/BANDAGES/DRESSINGS) ×2 IMPLANT
SUT VIC AB 0 CTX 36 (SUTURE) ×6
SUT VIC AB 0 CTX36XBRD ANBCTRL (SUTURE) ×3 IMPLANT
SUT VIC AB 2-0 CT1 27 (SUTURE) ×2
SUT VIC AB 2-0 CT1 TAPERPNT 27 (SUTURE) ×1 IMPLANT
SUT VIC AB 4-0 KS 27 (SUTURE) ×3 IMPLANT
TOWEL OR 17X24 6PK STRL BLUE (TOWEL DISPOSABLE) ×3 IMPLANT
TRAY FOLEY CATH SILVER 14FR (SET/KITS/TRAYS/PACK) ×3 IMPLANT

## 2016-04-06 NOTE — Transfer of Care (Signed)
Immediate Anesthesia Transfer of Care Note  Patient: Cynthia Kent  Procedure(s) Performed: Procedure(s): CESAREAN SECTION (N/A)  Patient Location: PACU  Anesthesia Type:Epidural  Level of Consciousness: awake, alert  and oriented  Airway & Oxygen Therapy: Patient Spontanous Breathing  Post-op Assessment: Report given to RN and Post -op Vital signs reviewed and stable  Post vital signs: Reviewed and stable  Last Vitals:  Vitals:   04/06/16 0430 04/06/16 0511  BP: 104/65   Pulse: 100   Resp:    Temp:  36.9 C    Last Pain:  Vitals:   04/06/16 0511  TempSrc: Oral  PainSc:       Patients Stated Pain Goal: 7 (04/05/16 0810)  Complications: No apparent anesthesia complications

## 2016-04-06 NOTE — Op Note (Signed)
[name] PROCEDURE DATE: 04/04/2016 - 04/06/2016  PREOPERATIVE DIAGNOSIS: Intrauterine pregnancy at  7220w4d weeks gestation; failure to progress: arrest of dilation and non-reassuring fetal status  POSTOPERATIVE DIAGNOSIS: The same  PROCEDURE: Primary Low Transverse Cesarean Section  SURGEON:  Dr. Candelaria CelesteJacob Tamarah Bhullar  ASSISTANT: Dondra PraderWill Roper, MS3  INDICATIONS: Cynthia Kent is a 25 y.o. G1P1001 at 3420w4d scheduled for cesarean section secondary to failure to progress: arrest of dilation and non-reassuring fetal status.  The risks of cesarean section discussed with the patient included but were not limited to: bleeding which may require transfusion or reoperation; infection which may require antibiotics; injury to bowel, bladder, ureters or other surrounding organs; injury to the fetus; need for additional procedures including hysterectomy in the event of a life-threatening hemorrhage; placental abnormalities wth subsequent pregnancies, incisional problems, thromboembolic phenomenon and other postoperative/anesthesia complications. The patient concurred with the proposed plan, giving informed written consent for the procedure.    FINDINGS:  Viable female infant in vertex, OP presentation.  Apgars 8 and 10, weight pending.  Clear amniotic fluid.  Intact placenta, three vessel cord.  Normal uterus, fallopian tubes and ovaries bilaterally.  ANESTHESIA:  Epidural INTRAVENOUS FLUIDS: 400ml ESTIMATED BLOOD LOSS: 500 ml URINE OUTPUT:  380 ml SPECIMENS: Placenta sent to pathology COMPLICATIONS: None immediate  PROCEDURE IN DETAIL:  The patient received intravenous antibiotics and had sequential compression devices applied to her lower extremities while in the preoperative area.  She was then taken to the operating room where epidural anesthesia was dosed up to surgical level and was found to be adequate. She was then placed in a dorsal supine position with a leftward tilt, and prepped and  draped in a sterile manner.  A foley catheter had been placed into her bladder and attached to constant gravity, which drained blood tinged urine, but cleared throughout the operation.  After an adequate timeout was performed, a Pfannenstiel skin incision was made with scalpel and carried through to the underlying layer of fascia. The fascia was incised in the midline and this incision was extended bilaterally using the Mayo scissors. Kocher clamps were applied to the superior aspect of the fascial incision and the underlying rectus muscles were dissected off bluntly. The rectus muscles were separated in the midline bluntly and the peritoneum was entered bluntly. An Alexis retractor was placed to aid in visualization of the uterus.  Attention was turned to the lower uterine segment where a transverse hysterotomy was made with a scalpel and extended bilaterally bluntly. The infant was successfully delivered, and cord was clamped and cut and infant was handed over to awaiting neonatology team. Uterine massage was then administered and the placenta delivered intact with three-vessel cord. The uterus was then cleared of clot and debris.  The hysterotomy was closed with 0 Vicryl in a running locked fashion, and an imbricating layer was also placed with a 0 Vicryl. Overall, excellent hemostasis was noted. The abdomen and the pelvis were cleared of all clot and debris and the Jon Gillslexis was removed. Hemostasis was confirmed on all surfaces.  The peritoneum was reapproximated using 2-0 vicryl running stitches. The fascia was then closed using 0 Vicryl in a running fashion. The skin was closed with 4-0 vicryl. The patient tolerated the procedure well. Sponge, lap, instrument and needle counts were correct x 2. She was taken to the recovery room in stable condition.    Levie HeritageJacob J Boyde Grieco, DO 04/06/2016 6:54 AM

## 2016-04-06 NOTE — Lactation Note (Signed)
This note was copied from a baby's chart. Lactation Consultation Note Initial visit at 10 hours of age.  Mom reports needing help with latching baby.  Mom is recovering from c/s with baby swaddled in blankets across her lap attempting latch.  MOm reports difficulty with hand by baby's face with latching.  Mom also reports needing to stabilize babys temperature with blankets.  After educating mom more,  she is willing to hold baby STS.  Mom has large soft breasts with semi flat nipples and compressible tissue.  Mom can easily hand express drops of colostrum. Pamelia HoitLevi will lick at nipple and open mouth to hold breast, but slips off.  Baby has eyes wide open, but does not appear eager to eat.  LC allowed baby to suck on gloved finger with baby noted to tongue thrust finger out of mouth.  LC encouraged mom to try suck training with baby to help improve this.   Mom agreeable to hand expression with 4mls spoon fed to baby.  Baby does not close mouth well to swallows and is mouthing spoon more than actively eating.  Baby noted to have decreased oral tone and again Lc encouraged suck training to help improve this. [facility] resources given and discussed.  Encouraged to feed with early cues on demand.  Early newborn behavior discussed.  Hand expression demonstrated by mom with colostrum visible.  Mom to call for assist as needed.    Patient Name: Boy Asencion PartridgeDenise Little-Faison [id number]'s Date: 04/06/2016 Reason for consult: Initial assessment   Maternal Data Has patient been taught Hand Expression?: Yes Does the patient have breastfeeding experience prior to this delivery?: No  Feeding Feeding Type: Breast Fed Length of feed: 2 min  LATCH Score/Interventions Latch: Too sleepy or reluctant, no latch achieved, no sucking elicited. Intervention(s): Skin to skin;Teach feeding cues;Waking techniques Intervention(s): Breast compression;Breast massage  Audible Swallowing: None Intervention(s): Skin to skin;Hand  expression  Type of Nipple: Flat  Comfort (Breast/Nipple): Soft / non-tender     Hold (Positioning): Assistance needed to correctly position infant at breast and maintain latch. Intervention(s): Breastfeeding basics reviewed;Support Pillows;Position options;Skin to skin  LATCH Score: 4  Lactation Tools Discussed/Used WIC Program: Yes   Consult Status Consult Status: Follow-up Date: 04/07/16 Follow-up type: In-patient    Beverely RisenShoptaw, Arvella MerlesJana Lynn 04/06/2016, 4:17 PM

## 2016-04-06 NOTE — Progress Notes (Signed)
Patient ID: Cynthia PartridgeDenise Little-Faison, female   DOB: 04/12/1991, 25 y.o.   MRN: [id number] Comfortable w/ epidural; on Gent and Clinda  VSS, now afeb 99.1 FHR 150s, avg LTV, +10x10accels, Cat 1 Ctx q 3 mins w/ Pit @ 7711mu/min; MVUs approx 100-120 Cx 9/80/-1  IUP@term  Protracted active phase Triple I  Dr Adrian BlackwaterStinson updated Will continue to increase Pit to achieve adequate MVUs; if no cx change/fetal descent, will proceed w/ C/S  Cam HaiSHAW, KIMBERLY CNM 04/06/2016 12:38 AM

## 2016-04-06 NOTE — Anesthesia Postprocedure Evaluation (Signed)
Anesthesia Post Note  Patient: Cynthia Kent  Procedure(s) Performed: Procedure(s) (LRB): CESAREAN SECTION (N/A)  Patient location during evaluation: Mother Baby Anesthesia Type: Epidural Level of consciousness: awake and awake and alert Pain management: pain level controlled Vital Signs Assessment: post-procedure vital signs reviewed and stable Respiratory status: spontaneous breathing Cardiovascular status: stable Postop Assessment: no headache, no backache, epidural receding, no signs of nausea or vomiting, patient able to bend at knees and adequate PO intake Anesthetic complications: no     Last Vitals:  Vitals:   04/06/16 1105 04/06/16 1320  BP: 119/70 120/63  Pulse: 68 74  Resp: 18 18  Temp: 36.9 C 36.6 C    Last Pain:  Vitals:   04/06/16 1320  TempSrc: Oral  PainSc: 7    Pain Goal: Patients Stated Pain Goal: 0 (04/06/16 1320)               Cashis Rill J

## 2016-04-06 NOTE — Progress Notes (Signed)
Patient ID: Cynthia Kent, female   DOB: [birth year], 25 y.o.   MRN: [id number]   Patient with recurrent decelerations with minimal variability and no accelerations. Cervix remains unchanged. The decelerations improve off pitocin. Discussed options with patient - she is okay with proceeding to cesarean delivery. The risks of cesarean section discussed with the patient included but were not limited to: bleeding which may require transfusion or reoperation; infection which may require antibiotics; injury to bowel, bladder, ureters or other surrounding organs; injury to the fetus; need for additional procedures including hysterectomy in the event of a life-threatening hemorrhage; placental abnormalities wth subsequent pregnancies, incisional problems, thromboembolic phenomenon and other postoperative/anesthesia complications. The patient concurred with the proposed plan, giving informed written consent for the procedure.   Patient has been NPO since last night, she will remain NPO for procedure. Anesthesia and OR aware.  Preoperative prophylactic clinda and gent ordered on call to the OR.  To OR when ready.  Levie HeritageJacob J Ricard Faulkner, DO 04/06/2016 4:44 AM

## 2016-04-07 ENCOUNTER — Inpatient Hospital Stay (HOSPITAL_COMMUNITY): Admission: RE | Admit: 2016-04-07 | Payer: Medicaid Other | Source: Ambulatory Visit

## 2016-04-07 ENCOUNTER — Encounter (HOSPITAL_COMMUNITY): Payer: Self-pay | Admitting: Family Medicine

## 2016-04-07 LAB — CBC
HEMATOCRIT: 28.1 % — AB (ref 36.0–46.0)
HEMOGLOBIN: 9.7 g/dL — AB (ref 12.0–15.0)
MCH: 29 pg (ref 26.0–34.0)
MCHC: 34.5 g/dL (ref 30.0–36.0)
MCV: 84.1 fL (ref 78.0–100.0)
Platelets: 182 10*3/uL (ref 150–400)
RBC: 3.34 MIL/uL — ABNORMAL LOW (ref 3.87–5.11)
RDW: 14.1 % (ref 11.5–15.5)
WBC: 15.8 10*3/uL — AB (ref 4.0–10.5)

## 2016-04-07 NOTE — Addendum Note (Signed)
Addendum  created 04/07/16 1501 by Heather RobertsJames Carey Johndrow, MD   Anesthesia Event edited

## 2016-04-07 NOTE — Progress Notes (Signed)
UR chart review completed.  

## 2016-04-07 NOTE — Progress Notes (Signed)
POSTPARTUM PROGRESS NOTE  Post Operative Day 1 Subjective:  Cynthia Kent is a 25 y.o. G1P1001 3957w4d s/p PLTCS for failure to progress.  No acute events overnight.  Pt denies problems with ambulating, voiding or po intake.  She denies nausea or vomiting.  Pain is well controlled.  She has had flatus. She has not had bowel movement.  Lochia Small.   Objective: Blood pressure 119/66, pulse 72, temperature 97.8 F (36.6 C), resp. rate 18, height 5\' 6"  (1.676 m), weight 231 lb (104.8 kg), SpO2 98 %, unknown if currently breastfeeding.  Physical Exam:  General: alert, cooperative and no distress Lochia:normal flow Chest: no respiratory distress Heart:regular rate, distal pulses intact Incision: small sero-sanguinous discharge, pressure dressing removed. Abdomen: soft, nontender,  Uterine Fundus: firm, appropriately tender DVT Evaluation: No calf swelling or tenderness Extremities: trace edema   Recent Labs  04/07/16 0506  HGB 9.7*  HCT 28.1*    Assessment/Plan:  ASSESSMENT: Cynthia Kent is a 25 y.o. G1P1001 4357w4d s/p PLTCS  Plan for discharge tomorrow, Breastfeeding and Lactation consult Tripple I: Afebrile since delivery. No longer on antibiotics   LOS: 3 days   Les Pouicholas SchenkMD 04/07/2016, 11:58 AM

## 2016-04-07 NOTE — Progress Notes (Signed)
Subjective: Postpartum Day 1: Cesarean Delivery Patient reports tolerating PO, + flatus and no problems voiding.  Doing well. Pain reported to be tolerable at 7/10 and improving. Up and moving around as tolerated. Latch score 4, working with lactation. Passed flatus but no bowel movement.  Objective: Vital signs in last 24 hours: Temp:  [97.8 F (36.6 C)-99 F (37.2 C)] 97.8 F (36.6 C) (12/13 0512) Pulse Rate:  [67-95] 72 (12/13 0512) Resp:  [16-20] 18 (12/13 0512) BP: (110-130)/(63-97) 119/66 (12/13 0512) SpO2:  [95 %-98 %] 98 % (12/13 0105)   Hgb 9.7 from 12.5  Physical Exam:  General: alert, cooperative, appears stated age, no distress and moderately obese Lochia: appropriate Uterine Fundus: firm Incision: healing well, no dehiscence, no significant erythema, slight serosanguinous discharge into pad DVT Evaluation: No evidence of DVT seen on physical exam.   Recent Labs  04/04/16 1025 04/07/16 0506  HGB 12.5 9.7*  HCT 37.1 28.1*    Assessment/Plan: Status post Cesarean section. Doing well postoperatively.  Continue current care. Continue to meet with lactation Plan for outpatient circumcision for son on Thursday at [facility] Hemoglobin 9.7 from 12.5 OCPs starting after 4 weeks Afebrile since surgery, antepartum at 100.6 due to triple I. S/p gentamycin and clindamycin. CTM temp   Will L Roper 04/07/2016, 7:33 AM  Patient ID: Cynthia Kent, female   DOB: 1991/02/16, 25 y.o.   MRN: [id number]   [facility] FELLOW MEDICAL STUDENT NOTE ATTESTATION  I have seen and examined this patient. Note this is a Psychologist, occupationalmedical student note and as such does not necessarily reflect the patient's plan of care. Please see Ernestina Pennaicholas Schenk MD's note for this date of service.    Ernestina Pennaicholas Schenk 04/07/2016, 11:58 AM

## 2016-04-08 MED ORDER — COMPLETENATE 29-1 MG PO CHEW
1.0000 | CHEWABLE_TABLET | Freq: Every day | ORAL | Status: DC
  Administered 2016-04-08 – 2016-04-09 (×2): 1 via ORAL
  Filled 2016-04-08 (×3): qty 1

## 2016-04-08 NOTE — Lactation Note (Signed)
This note was copied from a baby's chart. Lactation Consultation Note  Patient Name: Boy Asencion PartridgeDenise Little-Faison ZOXWR'UToday's Date: 04/08/2016 Reason for consult: Follow-up assessment Baby at 58 hr of life. At this visit baby was cueing on mom's chest. Mom placed baby on her thigh, leaned over baby, compressed the nipple, and attempted latch. Baby was tongue thrusting and lip sucking. Mom stated he normally latches on when he is ready and then he falls asleep after a few minutes. She stated baby has been doing a lot better at the breast. Mom has not used the DEBP today because she desires to "just let him get it from the tity". Baby has had 1 wet and 0 stool in the last 24 hr. Discussed the need for baby to be latching well at every feeding. Discussed the possible need for a NS and the current need for post pumping. Mom will offer the breast on demand 8+/24hr, post pump, and offer expressed milk per volume guidelines. If the next 2 feedings are as difficult as this one mom will request a NS. She is aware of lactation services and support group. She will call for support as need. Report given to RN.    Maternal Data    Feeding Feeding Type: Breast Fed Length of feed: 0 min  LATCH Score/Interventions Latch: Repeated attempts needed to sustain latch, nipple held in mouth throughout feeding, stimulation needed to elicit sucking reflex. Intervention(s): Assist with latch  Audible Swallowing: None Intervention(s): Hand expression;Skin to skin Intervention(s): Alternate breast massage  Type of Nipple: Everted at rest and after stimulation  Comfort (Breast/Nipple): Soft / non-tender     Hold (Positioning): Full assist, staff holds infant at breast Intervention(s): Position options  LATCH Score: 5  Lactation Tools Discussed/Used Pump Review: Setup, frequency, and cleaning;Milk Storage Initiated by:: ES Date initiated:: 04/08/16   Consult Status Consult Status: Follow-up Date:  04/09/16 Follow-up type: In-patient    Rulon Eisenmengerlizabeth E Arnella Pralle 04/08/2016, 4:58 PM

## 2016-04-08 NOTE — Progress Notes (Signed)
Spoke with mom about breastfeeding and baby not latching properly, mom did not want any help/intervention as she feels that the baby is latching appropriately. Offered help with latching as well as an EDBP. Mom stated that I could take the pump out the room as she did not need it. Mom is not open to help with breast feeding. Flo Shankserri Wilma Michaelson,

## 2016-04-08 NOTE — Progress Notes (Signed)
Pt was encouraged 0700 at beginning of shift to ambulate in the halls. At 1200 Pt ambulated in halls with FOB and baby for about twelve minutes. Mom given motrin before she started her walk. Levin Erperri Aidel Davisson,RN

## 2016-04-08 NOTE — Progress Notes (Signed)
CSW attempted to meet with [name] to offer support and complete assessment for hx of Anxiety, Bipolar Disorder and marijuana use.  [name] requests that CSW return at a later time.  CSW agreed.

## 2016-04-08 NOTE — Progress Notes (Signed)
POSTPARTUM PROGRESS NOTE  Post Operative Day 3 Subjective:  Cynthia Kent is a 25 y.o. G1P1001 5421w4d s/p pLTCS for failure to progress.  No acute events overnight.  Pt denies problems with ambulating, voiding or po intake.  She denies nausea or vomiting.  Pain is moderately controlled.  She has had flatus. She has not had bowel movement.  Lochia Moderate.   Objective: Blood pressure (!) 111/56, pulse (!) 56, temperature 98.2 F (36.8 C), temperature source Oral, resp. rate 17, height 5\' 6"  (1.676 m), weight 231 lb (104.8 kg), SpO2 98 %, unknown if currently breastfeeding.  Physical Exam:  General: alert, cooperative and no distress Lochia:normal flow Chest: no respiratory distress Heart:regular rate, distal pulses intact Incision: minimal drainage on honeycomb Abdomen: soft, nontender,  Uterine Fundus: firm, appropriately tender DVT Evaluation: No calf swelling or tenderness Extremities: +1 edema   Recent Labs  04/07/16 0506  HGB 9.7*  HCT 28.1*    Assessment/Plan:  ASSESSMENT: Cynthia Kent is a 25 y.o. G1P1001 4921w4d s/p pLTCS for FTP  Plan for discharge tomorrow   LOS: 4 days   Les Pouicholas SchenkMD 04/08/2016, 10:39 AM

## 2016-04-09 MED ORDER — OXYCODONE-ACETAMINOPHEN 5-325 MG PO TABS: 1.0000 | ORAL_TABLET | ORAL | 0 refills | Status: DC | PRN

## 2016-04-09 MED ORDER — IBUPROFEN 600 MG PO TABS: 600.0000 mg | ORAL_TABLET | Freq: Four times a day (QID) | ORAL | 0 refills | Status: DC | PRN

## 2016-04-09 MED ORDER — MEASLES, MUMPS & RUBELLA VAC ~~LOC~~ INJ: 0.5000 mL | INJECTION | Freq: Once | SUBCUTANEOUS | Status: DC

## 2016-04-09 NOTE — Discharge Summary (Signed)
OB Discharge Summary     Patient Name: Cynthia PartridgeDenise Little-Faison DOB: 1990-04-27 MRN: [id number]  Date of admission: 04/04/2016 Delivering MD: Levie HeritageSTINSON, JACOB J   Date of discharge: 04/09/2016  Admitting diagnosis: 41 weeks  water broke Intrauterine pregnancy: 5833w4d     Secondary diagnosis:  Active Problems:   Indication for care in labor or delivery  Additional problems: none     Discharge diagnosis: Term Pregnancy Delivered                                                                                                Post partum procedures:none  Augmentation: Pitocin  Complications: Intrauterine Inflammation or infection (Chorioamniotis)  Hospital course:  Induction of Labor With Cesarean Section  25 y.o. yo G1P1001 at 3233w4d was admitted to the hospital 04/04/2016 for induction of labor after PROM. Patient had a labor course significant for becoming 9cm w/ Pitocin and then FHR intolerance of labor as well as FTP. She also rec'd [facility] for Triple I. The patient went for cesarean section due to Arrest of Dilation and Non-Reassuring FHR, and delivered a Viable infant,@BABYSUPPRESS (DBLINK,ept,110,,1,,) Membrane Rupture Time/Date: )7:00 AM ,04/04/2016   @Details  of operation can be found in separate operative Note.  Patient had an uncomplicated postpartum course. She is ambulating, tolerating a regular diet, passing flatus, and urinating well.  Patient is discharged home in stable condition on 04/09/16.                                     Physical exam Vitals:   04/07/16 1800 04/08/16 0641 04/08/16 1847 04/09/16 0601  BP: 123/65 (!) 111/56 113/65 120/64  Pulse: 68 (!) 56 (!) 55 (!) 51  Resp: 18 17 18 18   Temp: 98.5 F (36.9 C) 98.2 F (36.8 C) 98.1 F (36.7 C) 98.4 F (36.9 C)  TempSrc: Oral Oral Oral   SpO2:      Weight:      Height:       General: alert and cooperative Lochia: appropriate Uterine Fundus: firm Incision: honeycomb intact DVT Evaluation: No  evidence of DVT seen on physical exam. Labs: Lab Results  Component Value Date   WBC 15.8 (H) 04/07/2016   HGB 9.7 (L) 04/07/2016   HCT 28.1 (L) 04/07/2016   MCV 84.1 04/07/2016   PLT 182 04/07/2016   CMP Latest Ref Rng & Units 06/27/2015  Glucose 65 - 99 mg/dL 409(W101(H)  BUN 6 - 20 mg/dL 11  Creatinine 1.190.44 - 1.471.00 mg/dL 8.290.81  Sodium 562135 - 130145 mmol/L 137  Potassium 3.5 - 5.1 mmol/L 3.7  Chloride 101 - 111 mmol/L 105  CO2 22 - 32 mmol/L 24  Calcium 8.9 - 10.3 mg/dL 9.2  Total Protein 6.5 - 8.1 g/dL 7.1  Total Bilirubin 0.3 - 1.2 mg/dL 0.4  Alkaline Phos 38 - 126 U/L 48  AST 15 - 41 U/L 20  ALT 14 - 54 U/L 17    Discharge instruction: per After Visit Summary and "Baby and Me Booklet".  After  visit meds:  Allergies as of 04/09/2016      Reactions   Amoxicillin Hives, Other (See Comments)   Has patient had a PCN reaction causing immediate rash, facial/tongue/throat swelling, SOB or lightheadedness with hypotension: No Has patient had a PCN reaction causing severe rash involving mucus membranes or skin necrosis: No Has patient had a PCN reaction that required hospitalization No Has patient had a PCN reaction occurring within the last 10 years: No If all of the above answers are "NO", then may proceed with Cephalosporin use.   Hydrocodone Itching   Latex Swelling, Rash      Medication List    TAKE these medications   ibuprofen 600 MG tablet Commonly known as:  ADVIL,MOTRIN Take 1 tablet (600 mg total) by mouth every 6 (six) hours as needed.   oxyCODONE-acetaminophen 5-325 MG tablet Commonly known as:  PERCOCET/ROXICET Take 1 tablet by mouth every 4 (four) hours as needed (pain scale 4-7).   PRENATAL GUMMIES/DHA & FA 0.4-32.5 MG Chew Chew 2 each by mouth daily.       Diet: routine diet  Activity: Advance as tolerated. Pelvic rest for 6 weeks.   Outpatient follow up:4 weeks Follow up Appt:Future Appointments Date Time Provider Department Center  05/05/2016 3:00 PM  Rachelle A Marjo Bickerenney, CNM [facility] None   Follow up Visit:No Follow-up on file.  Postpartum contraception: Undecided  Newborn Data: Live born female  Birth Weight: 8 lb 14.9 oz (4050 g) APGAR: 8, 10  Baby Feeding: Breast Disposition:home with mother   04/09/2016 Cam HaiSHAW, KIMBERLY, CNM  10:40 AM

## 2016-04-09 NOTE — Lactation Note (Signed)
This note was copied from a baby's chart. Lactation Consultation Note Mom and baby are scheduled to be d/c home today. Discussed BF w/mom, supply and demand, STS, I&O, BF, and care for breast. Moms breast are starting to fill. Wearing bra. Discussed w/mom massaging breast, not letting breast get engorged, pumping. Mom has hand pump for taking home. Mom has DEBP in rm. Encouraged mom to pump and relieve breast. Mom stated she doesn't like the DEBP, she's taking home the manual pump. Discussed breast anatomy and assessing breast and the importance of monitoring breast to prevent from getting engorged. Mom has WIC and will f/u with them.  LC fears that mom isn't listening and will end up engorged and in pain. Mom stated she will put the baby on the breast and he will drain the breast. Discussed how big her breast are and baby may not be able at this time be able to hand express once they get to full.  Educated on engorgement prevention, management, and storage of milk.                                                          Patient Name: Boy Asencion PartridgeDenise Little-Faison ZOXWR'UToday's Date: 04/09/2016 Reason for consult: Follow-up assessment   Maternal Data    Feeding    LATCH Score/Interventions       Type of Nipple: Everted at rest and after stimulation Intervention(s): Hand pump  Comfort (Breast/Nipple): Filling, red/small blisters or bruises, mild/mod discomfort  Problem noted: Filling Interventions (Filling): Massage;Frequent nursing;Hand pump        Lactation Tools Discussed/Used Tools: Pump Breast pump type: Manual   Consult Status Consult Status: Complete Date: 04/09/16    Charyl DancerCARVER, Mauro Arps G 04/09/2016, 6:15 AM

## 2016-04-09 NOTE — Progress Notes (Signed)
CLINICAL SOCIAL WORK MATERNAL/CHILD NOTE  Patient Details  Name: Cynthia Kent MRN: [id number] Date of Birth: 04/06/2016  Date:  04/09/2016  Clinical Social Worker Initiating Note:  Myrtle Barnhard, LCSW Date/ Time Initiated:  04/09/16/1122     Child's Name:  Cynthia Kent   Legal Guardian:  Other (Comment) (Parents: Cynthia Kent and Cynthia Kent)   Need for Interpreter:  None   Date of Referral:        Reason for Referral:  Other (Comment), Current Substance Use/Substance Use During Pregnancy  (Anxiety, Bipolar, Marijuana use)   Referral Source:  Central Nursery   Address:  [address] [place], [place] [address]  Phone number:  [phone]   Household Members:  Parents ([name] lives with her mother Cynthia Kent)   Natural Supports (not living in the home):  Friends, Immediate Family, Extended Family, Spouse/significant other ([name] reports that FOB is a great support as well as many family members and friends)   Professional Supports: Other (Comment) ([name] states she has mental health services through [facility] and has a "peer support" named Cynthia Kent who she finds very helpful)   Employment:     Type of Work:     Education:      Financial Resources:  Medicaid   Other Resources:      Cultural/Religious Considerations Which May Impact Care: None stated.  Strengths:  Ability to meet basic needs , Pediatrician chosen , Home prepared for child  (Pediatric follow up will be at [facility])   Risk Factors/Current Problems:  Mental Health Concerns , Substance Use    Cognitive State:  Alert , Able to Concentrate , Goal Oriented , Linear Thinking , Insightful    Mood/Affect:  Comfortable , Calm , Interested , Relaxed    CSW Assessment: CSW met with parents in [name] first floor room/106 to offer support and complete assessment due to [name] hx of Anxiety, Bipolar and Marijuana use noted in record.  [name] was lying in her bed next to baby who  was sleeping and FOB was sitting on the couch.  They were pleasant and welcoming of CSW's visit.  [name] gave permission to speak openly with FOB present.  CSW found [name] to be easy to engage.  FOB appears supportive. This is the first baby for both parents.  They state that they are excited about becoming parents.  FOB smiled as he spoke about this.  [name] reports that she has been emotional because this is "a wonderful experience" and that it "feels good to be a mom."  [name] reports that she was "in denial" when she found out she was pregnant because she was told at the ER when she went in due to thinking she had another cyst.  She states she told FOB right away, but kept the pregnancy a secret from family for months.  She reports that she eventually accepted that she was pregnant and states that her family has been supportive. [name] was open about her mental health hx and states she has been diagnosed with ADHD, PTSD, and Bipolar.  She reports that she receives services through [facility] and finds this beneficial.  It is unclear to CSW as to what, if any, psychiatric medications [name] has been prescribed in the past, but [name] states she does not want to take medications now because she wants to breast feed.  CSW explained that although breast feeding has great benefits to both mother and baby, that maternal mental health is of utmost importance.    CSW encouraged her to breast feed while monitoring for any concerning symptoms and to allow herself to seek assistance through medication if she identifies the need.  CSW encouraged her to speak with her doctor about the possibility of prescribing something that is safe to take while breast feeding.  She agreed.  She reports no emotional concerns at this time.  She speaks very highly of her "peer support person," Cynthia Kent at [facility].  [name] told CSW that Cynthia Kent helps her with transportation, coping skills, and with helping her understand things as she has "comprehension problems."  CSW did not  note [name] to have any difficulty understanding anything we discussed today.  She does not feel she needs any additional community supports at this time, but understands there are additional referrals that can be made if she feels she needs added support. [name] was also open about her substance use and states she smoked marijuana to increase her appetite while pregnant.  She states she spoke to her doctor in [place] (prior to transferring OB practices) and reports that he "didn't have a problem" with [name] using marijuana at times.  [name] states she does not think she has a problem with substance use and does not plan to use moving forward since use was specifically to increase appetite.  She also states it helped her sleep and relax, but states she does not think she was using it due to mental health concerns. CSW explained hospital drug screen policy and mandated reporting for positive screens.  CSW also informed [name] that a referral will be made to [facility] due to substance exposure in utero.  [name] was understanding.  FOB stated no questions.   CSW provided education regarding PMADs and gave [name] a "New Mom Checklist" to evaluate herself throughout the postpartum time period.  [name] seemed appreciative.  She seems very aware of the need to monitor and address mental health concerns.   Parents are aware of SIDS precautions, which CSW reviewed.  They report they have all necessary items for baby at home.     CSW Plan/Description:  Child Protective Service Report , No Further Intervention Required/No Barriers to Discharge, Patient/Family Education     [place] Freyre Elizabeth, LCSW 04/09/2016, 11:27 AM  

## 2016-04-09 NOTE — Plan of Care (Signed)
Problem: Nutritional: Goal: Mothers verbalization of comfort with breastfeeding process will improve I observed the patient latch the infant on the left breast. The infant initially latched on the left breast after a couple attempts by the mother to latch her using a variation of the cradle hold. Frequent swallows were audible for about one minute when the infant abruptly unlatched. The patient has short-shafted nipples and relies on the infant to latch herself. Patient states that the infant has been latching better on the right breast, but is not maintaining her latch on the left breast. The patient resisted my assistance, stating that the baby was "not making up her mind to stay on". After attempting approximately ten more times to re-latch the infant to the left breast, the patient switched the infant over to the right breast.   The patient is using a hand-pump to empty the left breast. The previous feeding, she fed the infant with a bottle 30 cc breast milk she had hand-pumped. I will consult with LC as to whether it will be appropriate to offer the patient a nipple shield for the left breast.

## 2016-05-05 ENCOUNTER — Ambulatory Visit (INDEPENDENT_AMBULATORY_CARE_PROVIDER_SITE_OTHER): Payer: Medicaid Other | Admitting: Certified Nurse Midwife

## 2016-05-05 ENCOUNTER — Encounter: Payer: Self-pay | Admitting: Certified Nurse Midwife

## 2016-05-05 DIAGNOSIS — O34219 Maternal care for unspecified type scar from previous cesarean delivery: Secondary | ICD-10-CM

## 2016-05-05 DIAGNOSIS — Z98891 History of uterine scar from previous surgery: Secondary | ICD-10-CM | POA: Insufficient documentation

## 2016-05-05 NOTE — Progress Notes (Signed)
Subjective:     Cynthia Kent is a 26 y.o. female who presents for a postpartum visit. She is 4 weeks postpartum following a low cervical transverse Cesarean section. I have fully reviewed the prenatal and intrapartum course. The delivery was at 41 gestational weeks. Outcome: primary cesarean section, low transverse incision. Anesthesia: spinal. Postpartum course has been normal. Baby's course has been normal. Baby is feeding by breast. Bleeding no bleeding. Bowel function is normal. Bladder function is normal. Patient is not sexually active. Contraception method is abstinence. Postpartum depression screening: negative.  Tobacco, alcohol and substance abuse history reviewed.  Adult immunizations reviewed including TDAP, rubella and varicella.  The following portions of the patient's history were reviewed and updated as appropriate: allergies, current medications, past family history, past medical history, past social history, past surgical history and problem list.  Review of Systems Pertinent items noted in HPI and remainder of comprehensive ROS otherwise negative.   Objective:    BP 114/68   Pulse 75   Temp 98.7 F (37.1 C) (Oral)   Wt 199 lb 4.8 oz (90.4 kg)   Breastfeeding? Yes   BMI 32.17 kg/m   General:  alert, cooperative and no distress   Breasts:  inspection negative, no nipple discharge or bleeding, no masses or nodularity palpable  Lungs: clear to auscultation bilaterally  Heart:  regular rate and rhythm, S1, S2 normal, no murmur, click, rub or gallop  Abdomen: soft, non-tender; bowel sounds normal; no masses,  no organomegaly   Vulva:  normal  Vagina: normal vagina, no discharge, exudate, lesion, or erythema  Cervix:  no cervical motion tenderness  Corpus: normal size, contour, position, consistency, mobility, non-tender  Adnexa:  normal adnexa  Rectal Exam: Not performed.          50% of 20 min visit spent on counseling and coordination of care.   Assessment:     Normal 4 week postpartum exam. Pap smear not done at today's visit.  Plan:    1. Contraception: abstinence 2. Declines contraception, contraception discussed 3. Follow up in: 1 year or as needed.  2hr GTT for h/o GDM/screening for DM q 3 yrs per ADA recommendations Preconception counseling provided Healthy lifestyle practices reviewed

## 2016-06-21 IMAGING — US US ABDOMEN COMPLETE
1 series · 14 of 25 positions shown · non-contrast
Comparison: None. Examination performed during down time,
technologist worksheet not available.

CLINICAL DATA: Abdominal pain for 2 months.

EXAM:
ULTRASOUND ABDOMEN COMPLETE

[Series 1: us abdomen complete · 0.20mm/px · 14 of 77 slices shown]
[im 1/77]
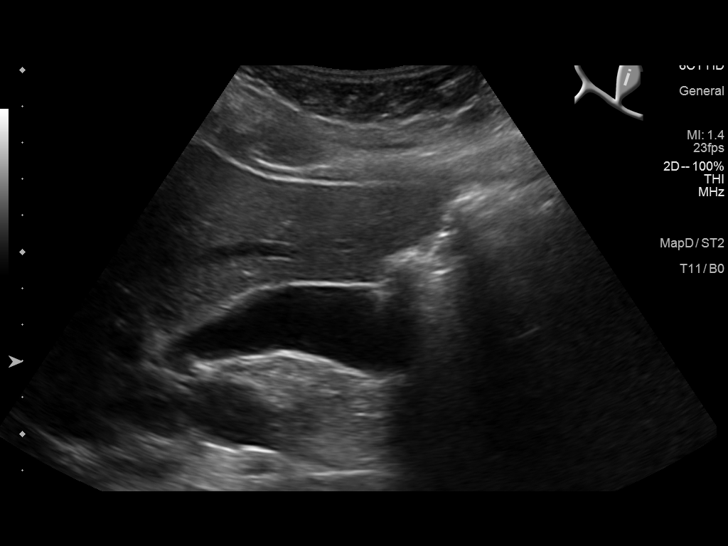
[im 7/77]
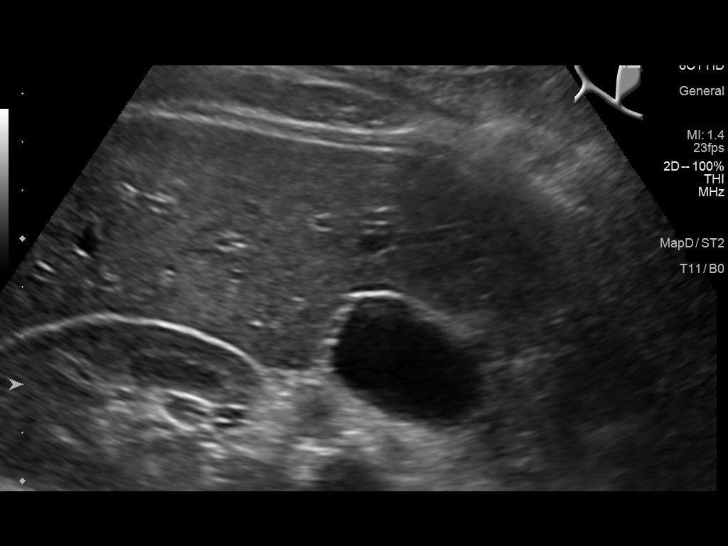
[im 13/77]
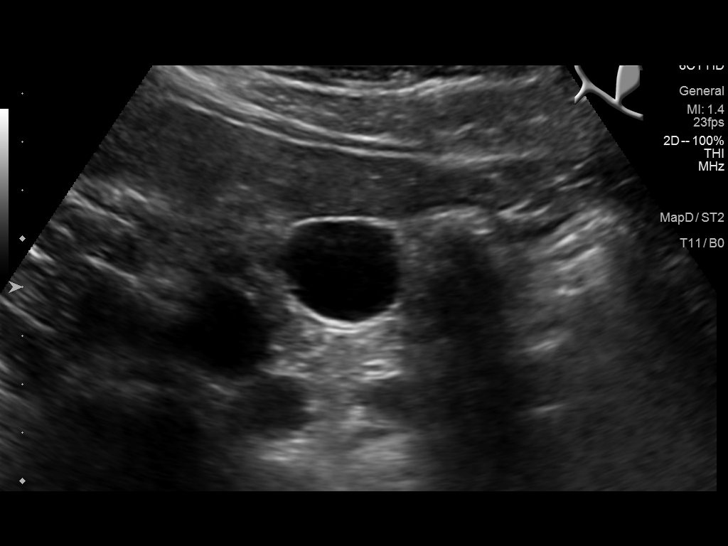
[im 20/77]
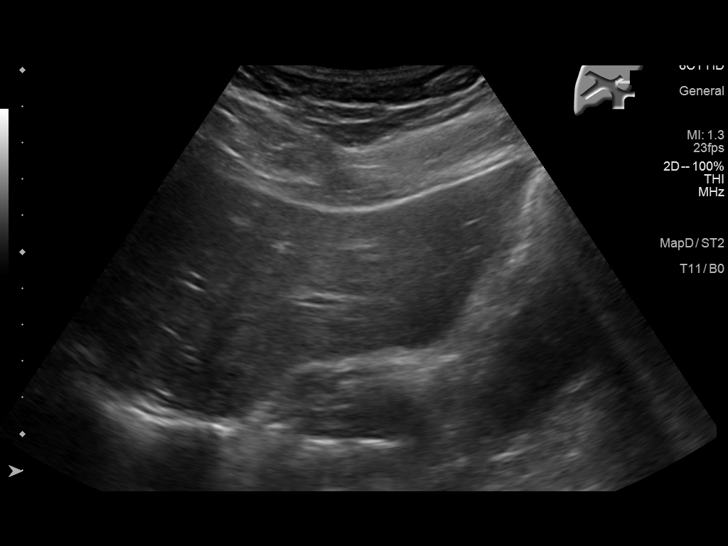
[im 26/77]
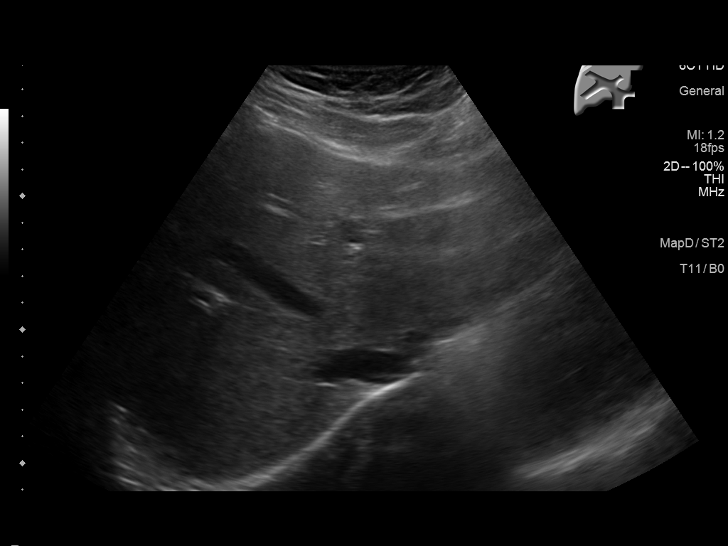
[im 29/77]
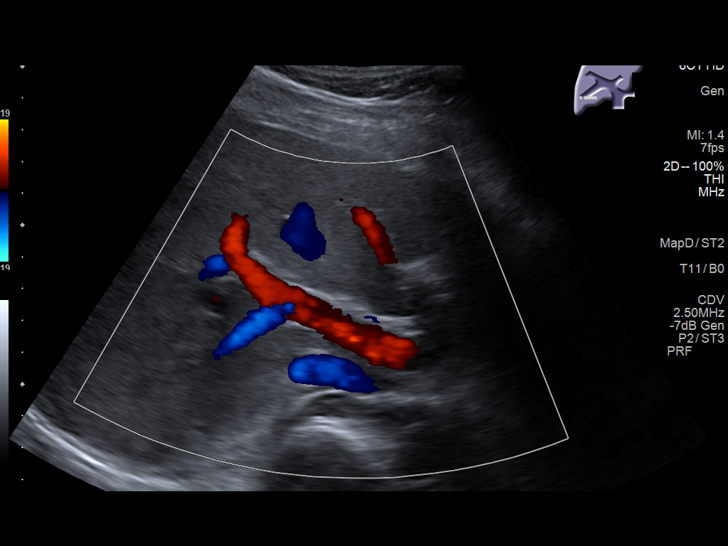
[im 35/77]
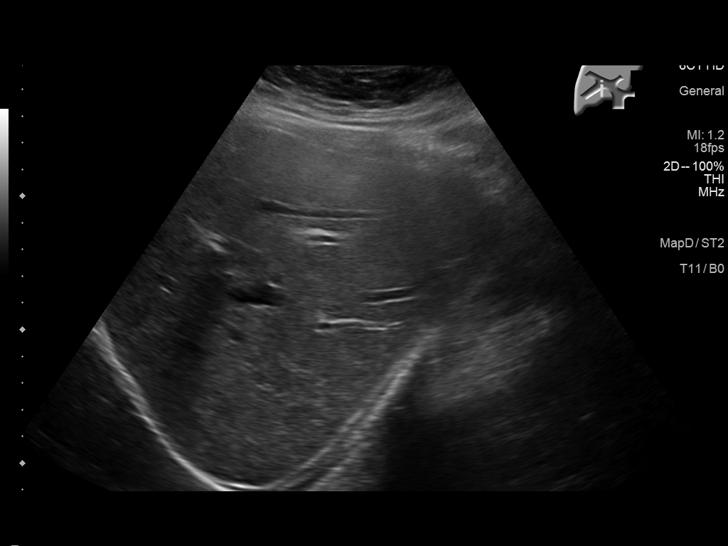
[im 42/77]
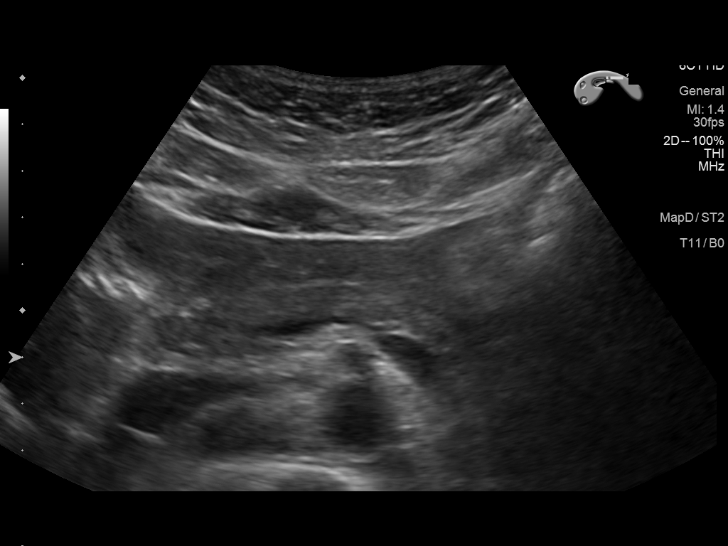
[im 48/77]
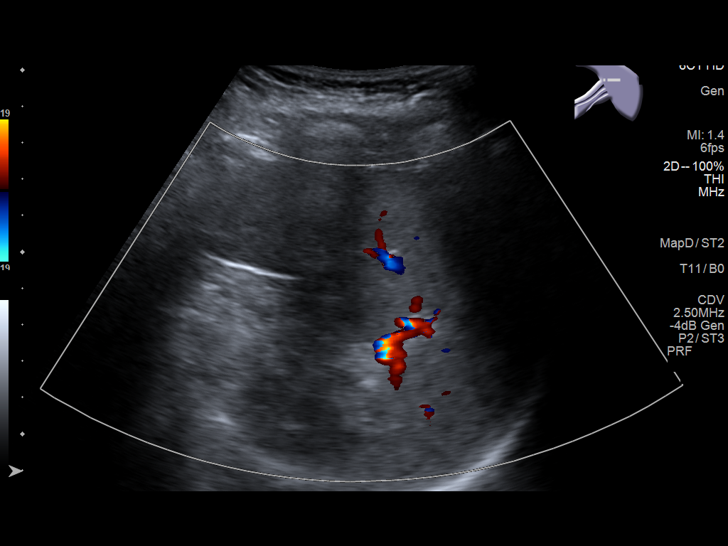
[im 51/77]
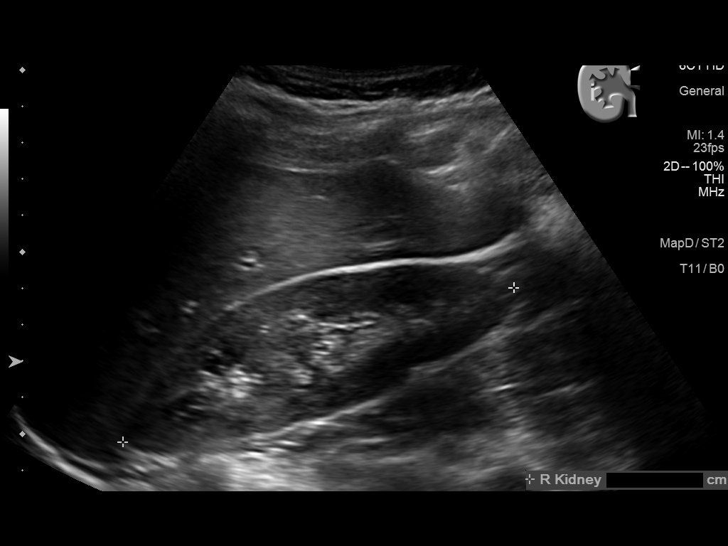
[im 58/77]
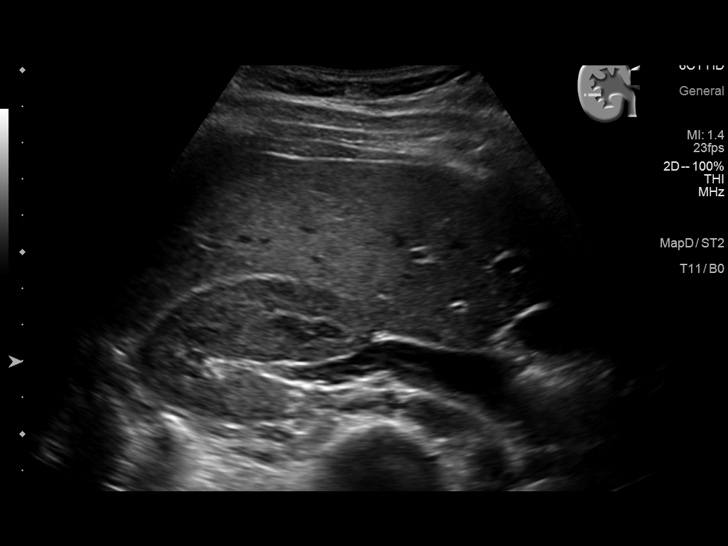
[im 64/77]
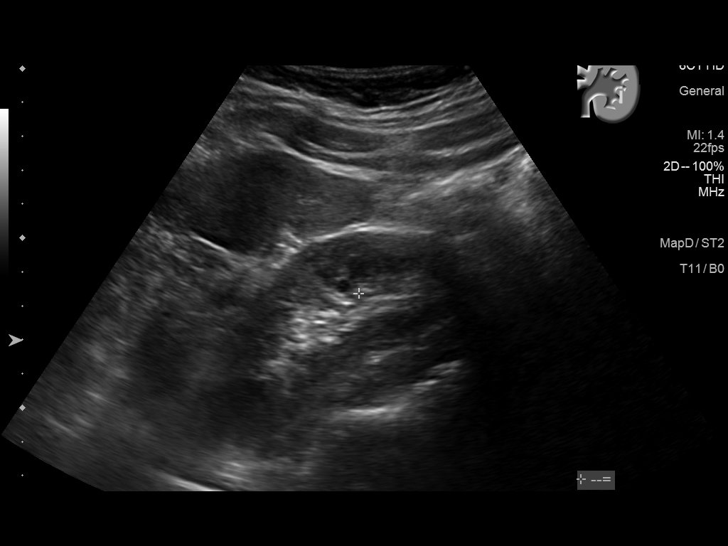
[im 70/77]
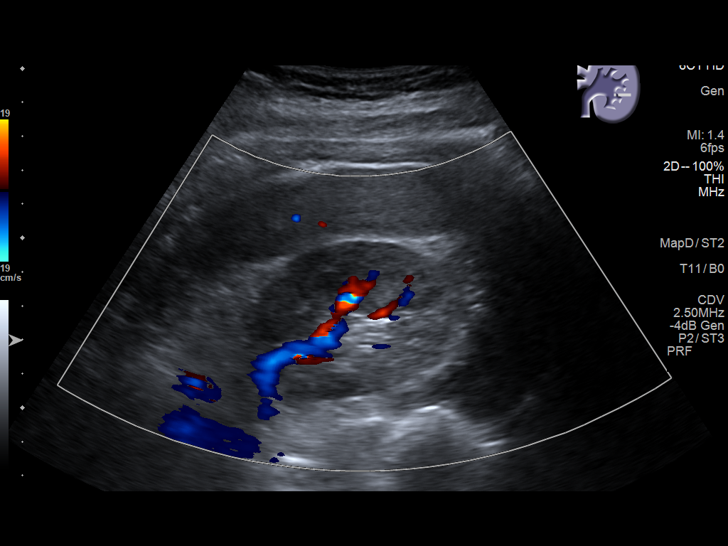
[im 77/77]
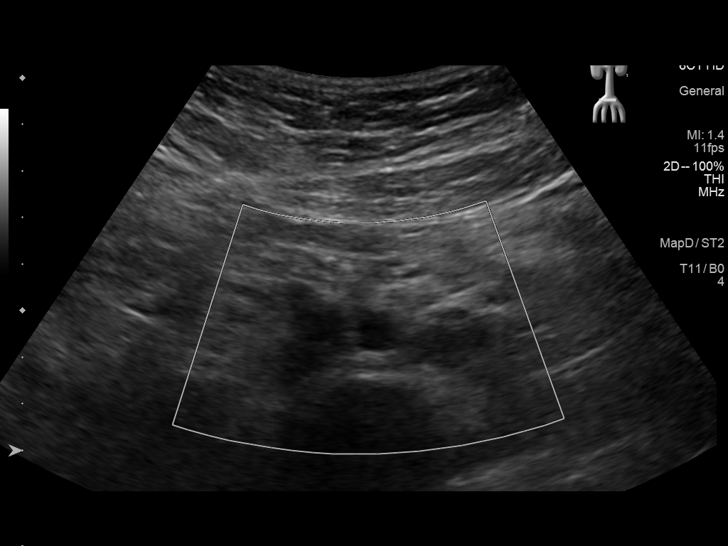

[14 of 25 positions shown; findings below may reference images not displayed]

FINDINGS: Gallbladder: No gallstones or wall thickening visualized. No
sonographic Murphy sign noted.

Common bile duct: Diameter: 1.3 mm, normal.

Liver: No focal lesion identified. Within normal limits in
parenchymal echogenicity.

IVC: No abnormality visualized.

Pancreas: Visualized portion unremarkable.

Spleen: Size and appearance within normal limits.

Right Kidney: Length: 11.5 cm. Echogenicity within normal limits. No
mass or hydronephrosis visualized.

Left Kidney: Length: 10.1 cm. Echogenicity within normal limits. No
mass or hydronephrosis visualized.

Abdominal aorta: No aneurysm visualized.

Other findings: None.  No ascites.
IMPRESSION: Normal abdominal ultrasound.

## 2016-06-22 IMAGING — US US TRANSVAGINAL NON-OB
1 series · 14 of 25 positions shown · non-contrast
Comparison: 11/25/2011

CLINICAL DATA: Pelvic pain for 2 months.



[Series 1: us transvaginal non-ob · 0.21mm/px · 14 of 42 slices shown]
[im 1/42]
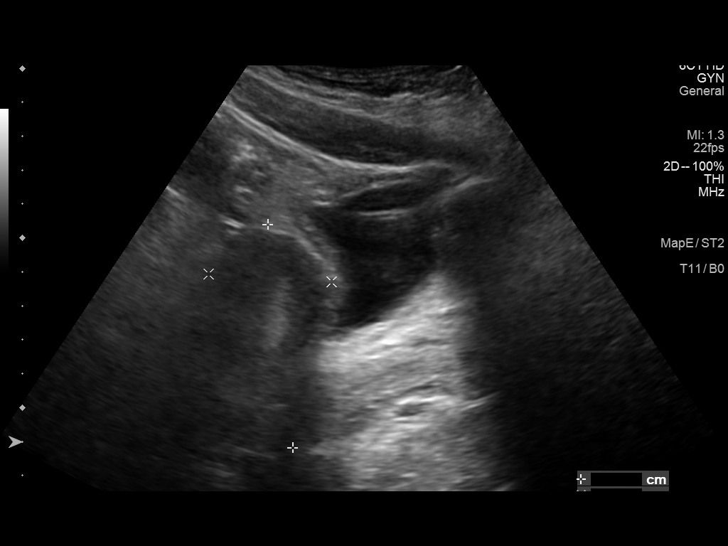
[im 4/42]
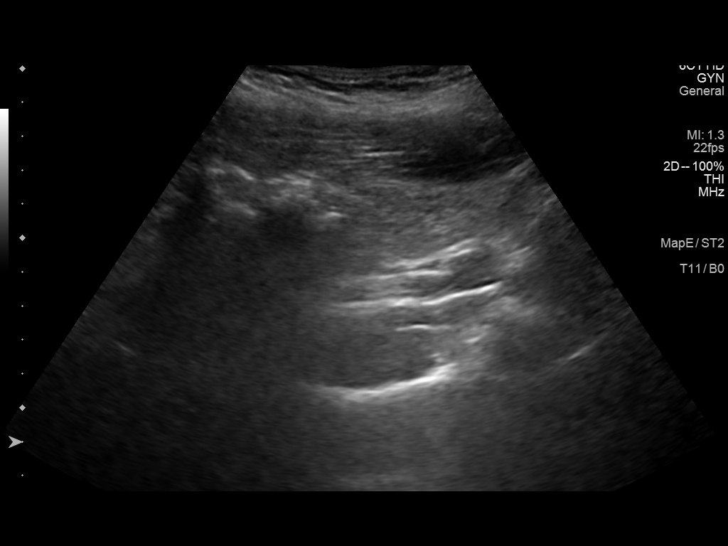
[im 7/42]
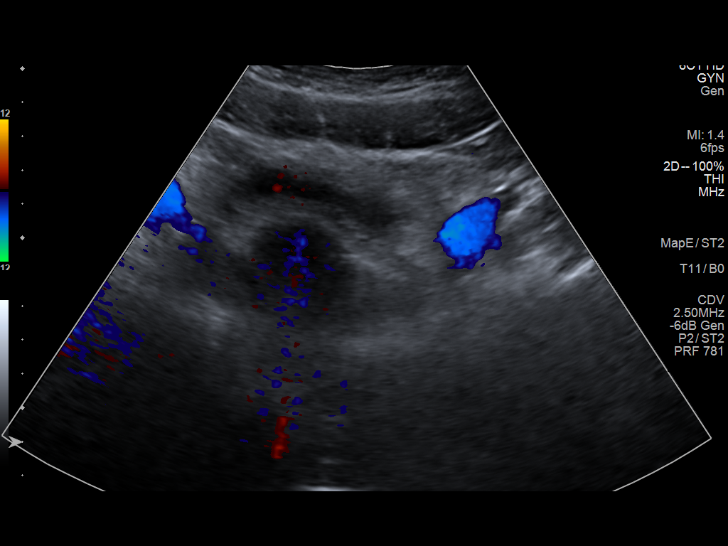
[im 11/42]
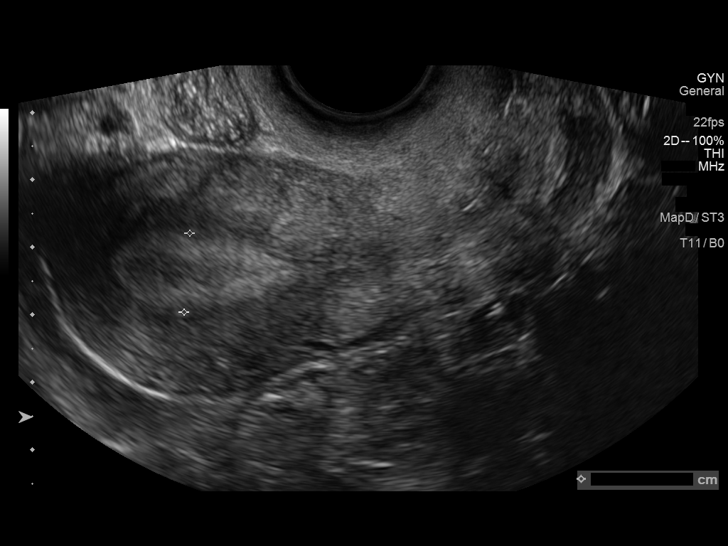
[im 14/42]
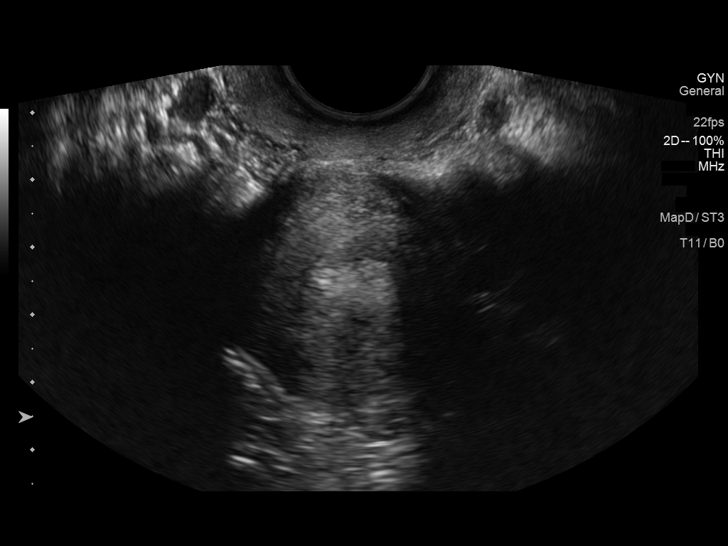
[im 16/42]
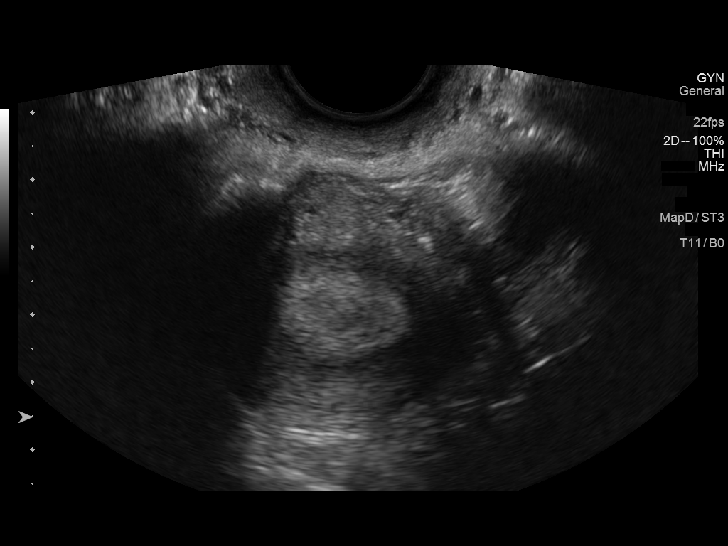
[im 19/42]
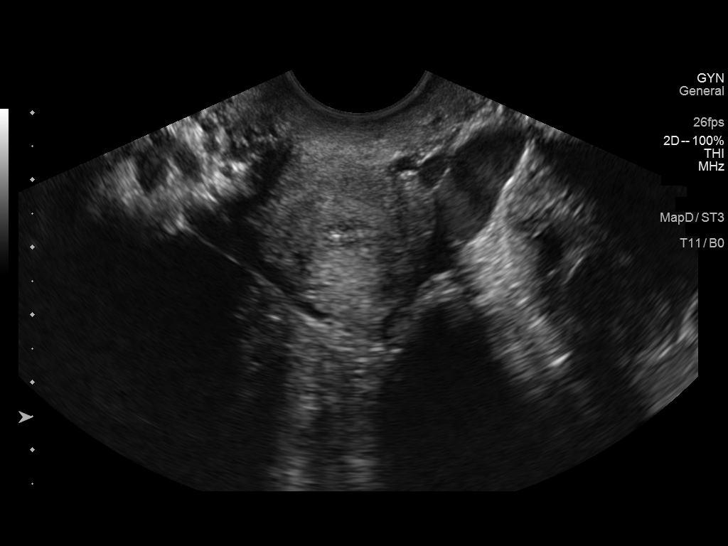
[im 23/42]
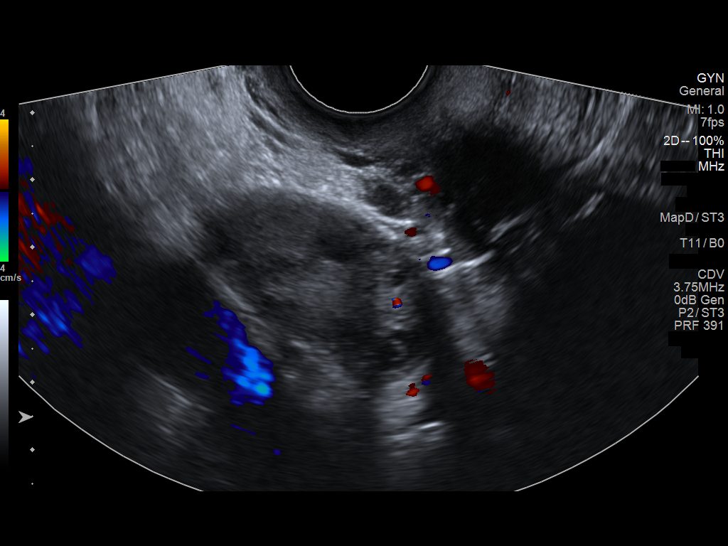
[im 26/42]
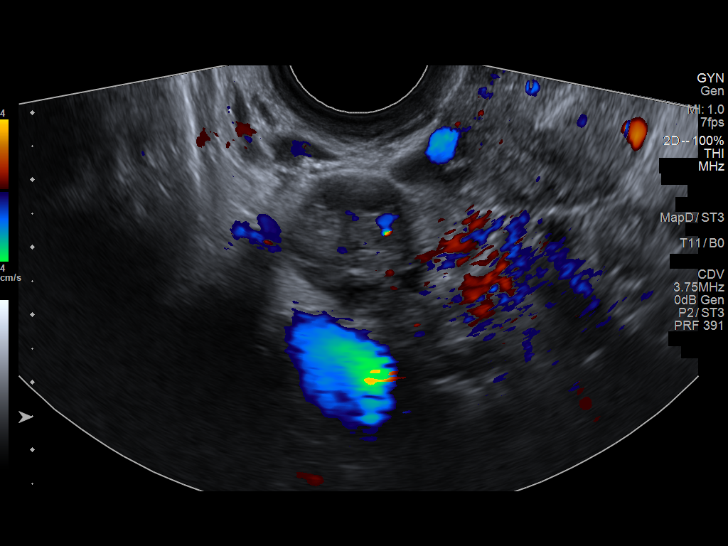
[im 28/42]
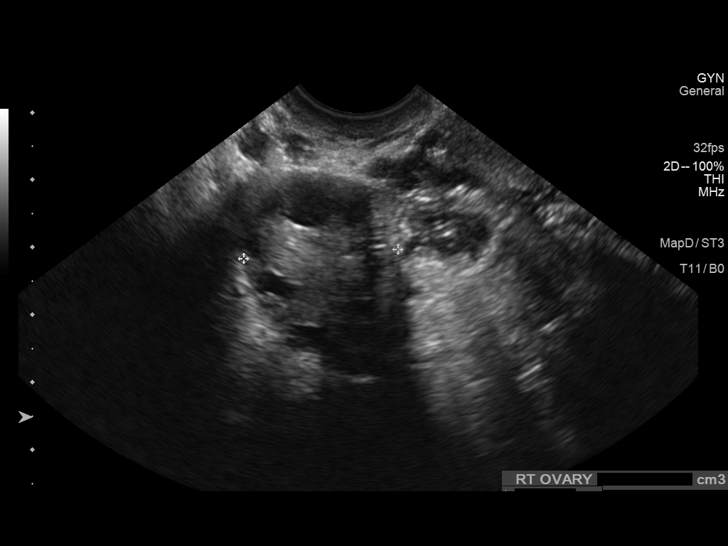
[im 31/42]
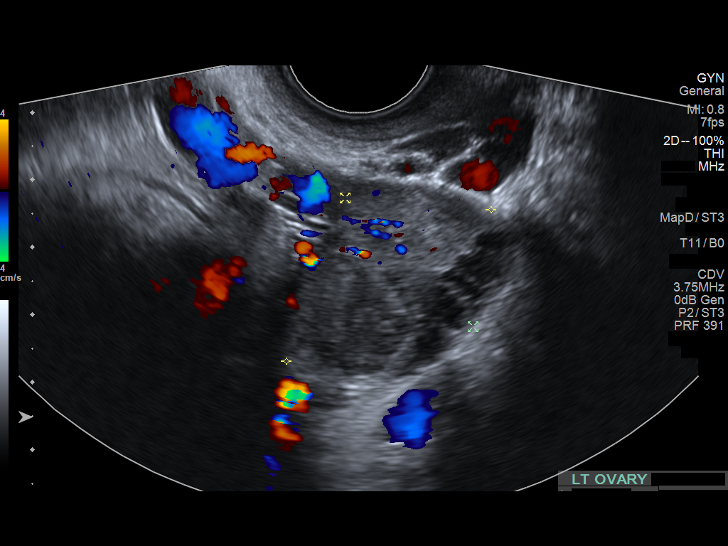
[im 35/42]
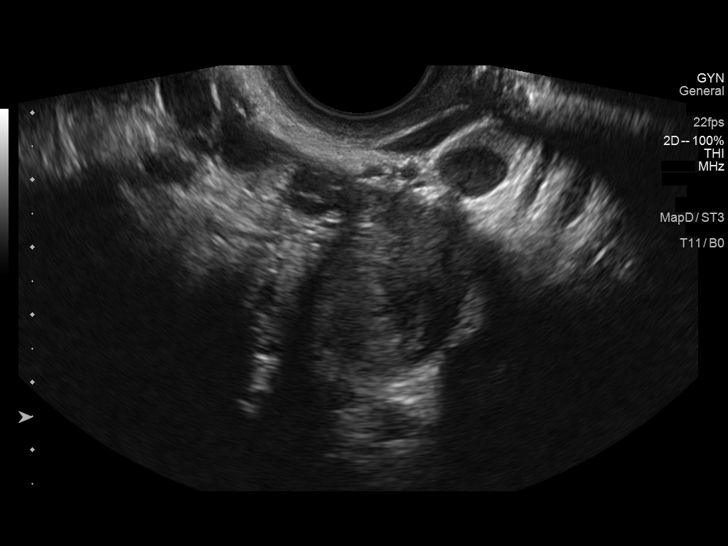
[im 38/42]
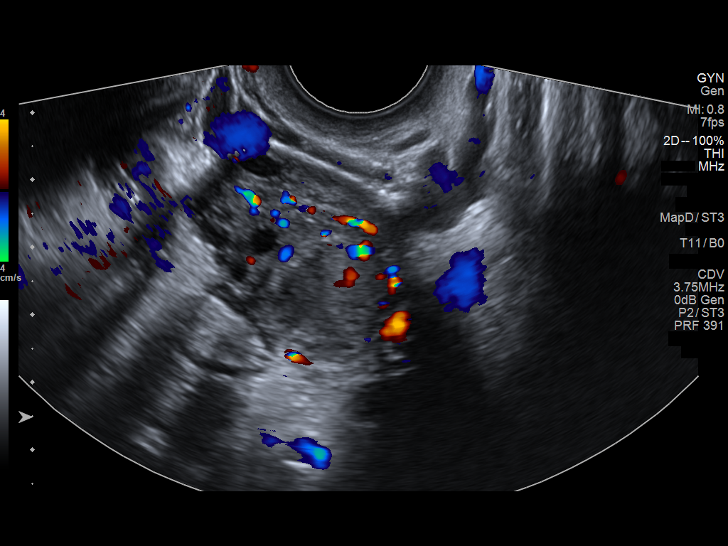
[im 42/42]
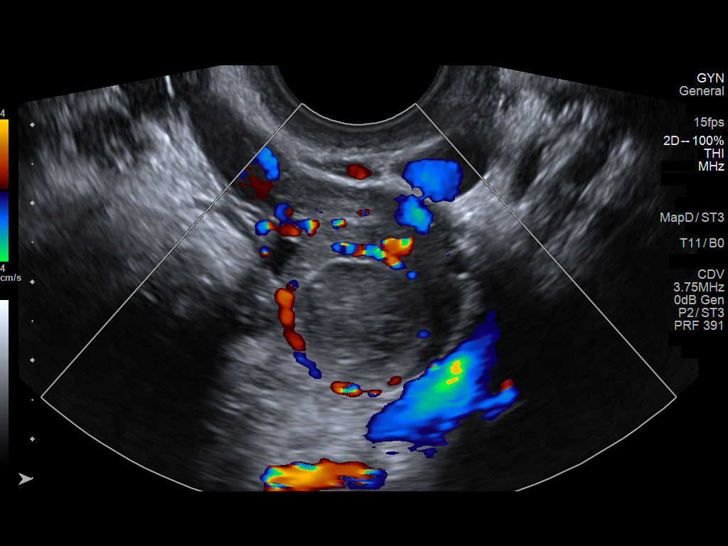

[14 of 25 positions shown; findings below may reference images not displayed]

FINDINGS: Uterus

Measurements: 8 x 4 x 4 cm. No fibroids or other mass visualized.

Endometrium

Thickness: 11 mm.  No focal abnormality visualized.

Right ovary

Measurements: 10 cc volume. Normal appearance/no adnexal mass.

Left ovary

Measurements: 17 cc volume. Corpus luteum present, likely with small
internal hemorrhage.

Other findings

Minimal free fluid.

Exam dictated in down time status. Sonographer report currently not
available.
IMPRESSION: 1. No acute findings.
2. Left corpus luteum.

## 2016-08-06 ENCOUNTER — Ambulatory Visit (INDEPENDENT_AMBULATORY_CARE_PROVIDER_SITE_OTHER): Payer: Medicaid Other | Admitting: Family Medicine

## 2016-08-06 ENCOUNTER — Encounter: Payer: Self-pay | Admitting: Family Medicine

## 2016-08-06 VITALS — BP 120/70 | HR 100 | Temp 98.9°F | Ht 66.5 in | Wt 215.0 lb

## 2016-08-06 DIAGNOSIS — F418 Other specified anxiety disorders: Secondary | ICD-10-CM | POA: Diagnosis not present

## 2016-08-06 DIAGNOSIS — F4310 Post-traumatic stress disorder, unspecified: Secondary | ICD-10-CM | POA: Diagnosis not present

## 2016-08-06 DIAGNOSIS — N912 Amenorrhea, unspecified: Secondary | ICD-10-CM

## 2016-08-06 DIAGNOSIS — F909 Attention-deficit hyperactivity disorder, unspecified type: Secondary | ICD-10-CM

## 2016-08-06 DIAGNOSIS — F319 Bipolar disorder, unspecified: Secondary | ICD-10-CM | POA: Diagnosis not present

## 2016-08-06 DIAGNOSIS — F419 Anxiety disorder, unspecified: Secondary | ICD-10-CM

## 2016-08-06 DIAGNOSIS — Z3689 Encounter for other specified antenatal screening: Secondary | ICD-10-CM | POA: Insufficient documentation

## 2016-08-06 DIAGNOSIS — Z30019 Encounter for initial prescription of contraceptives, unspecified: Secondary | ICD-10-CM

## 2016-08-06 DIAGNOSIS — Z30011 Encounter for initial prescription of contraceptive pills: Secondary | ICD-10-CM

## 2016-08-06 DIAGNOSIS — F329 Major depressive disorder, single episode, unspecified: Secondary | ICD-10-CM

## 2016-08-06 LAB — POCT URINE PREGNANCY: PREG TEST UR: NEGATIVE

## 2016-08-06 MED ORDER — NORGESTIMATE-ETH ESTRADIOL 0.25-35 MG-MCG PO TABS: 1.0000 | ORAL_TABLET | Freq: Every day | ORAL | 11 refills | Status: DC

## 2016-08-06 NOTE — Patient Instructions (Signed)
Start the birth control.  Come back in 2 months for your birth control.  Take care,  Dr Jimmey Ralph

## 2016-08-06 NOTE — Assessment & Plan Note (Signed)
Urine pregnancy negative. Recommended LARC however patient deferred. Will start OCPs. Recommended back up contraception for the next 7 days. Will return soon for pap smear.

## 2016-08-06 NOTE — Progress Notes (Signed)
Subjective:  Cynthia Kent is a 26 y.o. female who presents to the [facility] today with a chief complaint of contraception management and also to establish care.   HPI:  Contraception Patient gave birth 4 months ago and has not been using any contraception. She has had unprotected sex since then. No period since her child was born. She has tried depo in the past and did not like it. She does not want LARC therapy. Interested in OCPs.   ROS: Per HPI, otherwise all systems reviewed and are negative  PMH:  The following were reviewed and entered/updated in epic: Past Medical History:  Diagnosis Date  . Acid reflux disease   . ADHD (attention deficit hyperactivity disorder)   . Anxiety   . Bipolar disorder (HCC)   . Depression   . Drug-seeking behavior   . 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 Ovarian cyst   . Sleep apnea    Patient Active Problem List   Diagnosis Date Noted  . Attention deficit hyperactivity disorder (ADHD) 08/06/2016  . Bipolar disorder (HCC) 08/06/2016  . PTSD (post-traumatic stress disorder) 08/06/2016  . Anxiety and depression 08/06/2016  . Contraception management 08/06/2016  . Previous cesarean delivery, delivered 05/05/2016  . Snoring 11/17/2015  . History of marijuana use 11/17/2015   Past Surgical History:  Procedure Laterality Date  . CESAREAN SECTION N/A 04/06/2016   Procedure: CESAREAN SECTION;  Surgeon: Levie Heritage, DO;  Location: [facility] BIRTHING SUITES;  Service: Obstetrics;  Laterality: N/A;  . KNEE SURGERY    . LEG SURGERY     when patient was 26 years old    Family History  Problem Relation Age of Onset  . Cancer Maternal Grandmother   . Diabetes Neg Hx     Medications- reviewed and updated Current Outpatient Prescriptions  Medication Sig Dispense Refill  . amphetamine-dextroamphetamine (ADDERALL) 20 MG tablet Take 20 mg by mouth daily.    . QUEtiapine (SEROQUEL) 100 MG tablet Take 150 mg by mouth at bedtime.    .  norgestimate-ethinyl estradiol (SPRINTEC 28) 0.25-35 MG-MCG tablet Take 1 tablet by mouth daily. 1 Package 11  . Prenatal MV-Min-FA-Omega-3 (PRENATAL GUMMIES/DHA & FA) 0.4-32.5 MG CHEW Chew 2 each by mouth daily.     No current facility-administered medications for this visit.     Allergies-reviewed and updated Allergies  Allergen Reactions  . Amoxicillin Hives and Other (See Comments)    Has patient had a PCN reaction causing immediate rash, facial/tongue/throat swelling, SOB or lightheadedness with hypotension: No Has patient had a PCN reaction causing severe rash involving mucus membranes or skin necrosis: No Has patient had a PCN reaction that required hospitalization No Has patient had a PCN reaction occurring within the last 10 years: No If all of the above answers are "NO", then may proceed with Cephalosporin use.  Marland Kitchen Hydrocodone Itching  . Latex Swelling and Rash    Social History   Social History  . Marital status: Single    Spouse name: N/A  . Number of children: N/A  . Years of education: HS   Social History Main Topics  . Smoking status: Former Smoker    Types: Cigarettes  . Smokeless tobacco: Never Used     Comment: Currently will take "a puff"   . Alcohol use No     Comment: not since pregnancy   . Drug use: No  . Sexual activity: Yes    Birth control/ protection: None   Other Topics  Concern  . None   Social History Narrative   Drinks ginger ale    Objective:  Physical Exam: BP 120/70   Pulse 100   Temp 98.9 F (37.2 C) (Oral)   Ht 5' 6.5" (1.689 m)   Wt 215 lb (97.5 kg)   LMP  (LMP Unknown)   SpO2 98%   BMI 34.18 kg/m   Gen: NAD, resting comfortably CV: RRR with no murmurs appreciated Pulm: NWOB, CTAB with no crackles, wheezes, or rhonchi GI: Normal bowel sounds present. Soft, Nontender, Nondistended. MSK: no edema, cyanosis, or clubbing noted Skin: warm, dry Neuro: grossly normal, moves all extremities Psych: Normal affect and thought  content  Results for orders placed or performed in visit on 08/06/16 (from the past 72 hour(s))  POCT urine pregnancy     Status: None   Collection Time: 08/06/16  3:45 PM  Result Value Ref Range   Preg Test, Ur Negative Negative   Assessment/Plan:  Contraception management Urine pregnancy negative. Recommended LARC however patient deferred. Will start OCPs. Recommended back up contraception for the next 7 days. Will return soon for pap smear.   Katina Degree. Jimmey Ralph, MD [facility] Family Medicine Resident PGY-3 08/06/2016 4:57 PM

## 2016-08-09 ENCOUNTER — Telehealth: Payer: Self-pay

## 2016-08-09 NOTE — Telephone Encounter (Signed)
Pt had an apt with pcp on Friday, 08/06/2016. Pt brought a PCS form to be completed, provider finished form end of day Friday afternoon, per pt request, have form faxed to [employer]. Form has been faxed and copy made for patients chart.

## 2016-09-01 ENCOUNTER — Emergency Department (HOSPITAL_BASED_OUTPATIENT_CLINIC_OR_DEPARTMENT_OTHER)
Admission: EM | Admit: 2016-09-01 | Discharge: 2016-09-01 | Disposition: A | Discharge status: Home / Self Care | Payer: Medicaid Other | Attending: Emergency Medicine | Admitting: Emergency Medicine

## 2016-09-01 ENCOUNTER — Encounter (HOSPITAL_BASED_OUTPATIENT_CLINIC_OR_DEPARTMENT_OTHER): Payer: Self-pay | Admitting: *Deleted

## 2016-09-01 DIAGNOSIS — Z87891 Personal history of nicotine dependence: Secondary | ICD-10-CM | POA: Insufficient documentation

## 2016-09-01 DIAGNOSIS — J029 Acute pharyngitis, unspecified: Secondary | ICD-10-CM | POA: Diagnosis present

## 2016-09-01 DIAGNOSIS — Z79899 Other long term (current) drug therapy: Secondary | ICD-10-CM | POA: Diagnosis not present

## 2016-09-01 DIAGNOSIS — F909 Attention-deficit hyperactivity disorder, unspecified type: Secondary | ICD-10-CM | POA: Diagnosis not present

## 2016-09-01 DIAGNOSIS — J020 Streptococcal pharyngitis: Secondary | ICD-10-CM | POA: Diagnosis not present

## 2016-09-01 LAB — RAPID STREP SCREEN (MED CTR MEBANE ONLY): Streptococcus, Group A Screen (Direct): POSITIVE — AB

## 2016-09-01 MED ORDER — DEXAMETHASONE 6 MG PO TABS
10.0000 mg | ORAL_TABLET | Freq: Once | ORAL | Status: AC
  Administered 2016-09-01: 15:00:00 10 mg via ORAL
  Filled 2016-09-01: qty 1

## 2016-09-01 MED ORDER — CEPHALEXIN 500 MG PO CAPS: 500.0000 mg | ORAL_CAPSULE | Freq: Four times a day (QID) | ORAL | 0 refills | Status: AC

## 2016-09-01 MED FILL — CEPHALEXIN 500 MG CAPSULE: 500 | 10 days supply | Qty: 40 | Fill #0

## 2016-09-01 NOTE — ED Triage Notes (Signed)
Pt c/o  Sore throat x 2 days

## 2016-09-01 NOTE — ED Provider Notes (Signed)
[facility] Provider [facility]   CSN: [id number] Arrival date & time: 09/01/16  1355     History   Chief Complaint Chief Complaint  Patient presents with  . Sore Throat    HPI Cynthia Kent is a 26 y.o. female.  26 yo F with a chief complaint of sore throat cough and congestion. Going on for the past couple days. Had a fever as high as 100.6 at home. Having pain with swallowing. Unknown sick contacts.   The history is provided by the patient.  Sore Throat  This is a new problem. The current episode started 2 days ago. The problem occurs constantly. The problem has not changed since onset.Pertinent negatives include no chest pain, no headaches and no shortness of breath. Nothing aggravates the symptoms. Nothing relieves the symptoms. She has tried nothing for the symptoms. The treatment provided no relief.    Past Medical History:  Diagnosis Date  . Acid reflux disease   . ADHD (attention deficit hyperactivity disorder)   . Anxiety   . Bipolar disorder (HCC)   . Depression   . Drug-seeking behavior   . 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 Ovarian cyst   . Sleep apnea     Patient Active Problem List   Diagnosis Date Noted  . Attention deficit hyperactivity disorder (ADHD) 08/06/2016  . Bipolar disorder (HCC) 08/06/2016  . PTSD (post-traumatic stress disorder) 08/06/2016  . Anxiety and depression 08/06/2016  . Contraception management 08/06/2016  . Previous cesarean delivery, delivered 05/05/2016  . Snoring 11/17/2015  . History of marijuana use 11/17/2015    Past Surgical History:  Procedure Laterality Date  . CESAREAN SECTION N/A 04/06/2016   Procedure: CESAREAN SECTION;  Surgeon: Levie Heritage, DO;  Location: [facility] BIRTHING SUITES;  Service: Obstetrics;  Laterality: N/A;  . KNEE SURGERY    . LEG SURGERY     when patient was 26 years old    OB History    Gravida Para Term Preterm AB Living   1 1 1     1    SAB TAB Ectopic Multiple Live Births           0 1       Home Medications    Prior to Admission medications   Medication Sig Start Date End Date Taking? Authorizing Provider  amphetamine-dextroamphetamine (ADDERALL) 20 MG tablet Take 20 mg by mouth daily.    [provider]  cephALEXin (KEFLEX) 500 MG capsule Take 1 capsule (500 mg total) by mouth 4 (four) times daily. 09/01/16 09/11/16  Melene Plan, DO  norgestimate-ethinyl estradiol (SPRINTEC 28) 0.25-35 MG-MCG tablet Take 1 tablet by mouth daily. 08/06/16   Ardith Dark, MD  Prenatal MV-Min-FA-Omega-3 (PRENATAL GUMMIES/DHA & FA) 0.4-32.5 MG CHEW Chew 2 each by mouth daily.    [provider]  QUEtiapine (SEROQUEL) 100 MG tablet Take 150 mg by mouth at bedtime.    [provider]    Family History Family History  Problem Relation Age of Onset  . Cancer Maternal Grandmother   . Diabetes Neg Hx     Social History Social History  Substance Use Topics  . Smoking status: Former Smoker    Types: Cigarettes  . Smokeless tobacco: Never Used     Comment: Currently will take "a puff"   . Alcohol use No     Comment: not since pregnancy      Allergies   Amoxicillin; Hydrocodone; and Latex   Review of  Systems Review of Systems  Constitutional: Negative for chills and fever.  HENT: Positive for sore throat. Negative for congestion and rhinorrhea.   Eyes: Negative for redness and visual disturbance.  Respiratory: Negative for shortness of breath and wheezing.   Cardiovascular: Negative for chest pain and palpitations.  Gastrointestinal: Negative for nausea and vomiting.  Genitourinary: Negative for dysuria and urgency.  Musculoskeletal: Negative for arthralgias and myalgias.  Skin: Negative for pallor and wound.  Neurological: Negative for dizziness and headaches.     Physical Exam Updated Vital Signs BP 126/63   Pulse 97   Temp 99 F (37.2 C)   Resp 18   Ht 5' 6.5" (1.689 m)   Wt 219 lb (99.3 kg)   LMP 08/11/2016   SpO2 100%    BMI 34.82 kg/m   Physical Exam  Constitutional: She is oriented to person, place, and time. She appears well-developed and well-nourished. No distress.  HENT:  Head: Normocephalic and atraumatic.  Swollen turbinates, posterior nasal drip, bilateral tonsillar swelling with exudates no noted sinus ttp, tm normal bilaterally.    Eyes: EOM are normal. Pupils are equal, round, and reactive to light.  Neck: Normal range of motion. Neck supple.  Cardiovascular: Normal rate and regular rhythm.  Exam reveals no gallop and no friction rub.   No murmur heard. Pulmonary/Chest: Effort normal. She has no wheezes. She has no rales.  Abdominal: Soft. She exhibits no distension. There is no tenderness.  Musculoskeletal: She exhibits no edema or tenderness.  Neurological: She is alert and oriented to person, place, and time.  Skin: Skin is warm and dry. She is not diaphoretic.  Psychiatric: She has a normal mood and affect. Her behavior is normal.  Nursing note and vitals reviewed.    ED Treatments / Results  Labs (all labs ordered are listed, but only abnormal results are displayed) Labs Reviewed  RAPID STREP SCREEN (NOT AT [facility]) - Abnormal; Notable for the following:       Result Value   Streptococcus, Group A Screen (Direct) POSITIVE (*)    All other components within normal limits    EKG  EKG Interpretation None       Radiology No results found.  Procedures Procedures (including critical care time)  Medications Ordered in ED Medications  dexamethasone (DECADRON) tablet 10 mg (10 mg Oral Given 09/01/16 1510)     Initial Impression / Assessment and Plan / ED Course  I have reviewed the triage vital signs and the nursing notes.  Pertinent labs & imaging results that were available during my care of the patient were reviewed by me and considered in my medical decision making (see chart for details).     26 yo F With a chief complaint of a sore throat. Found to have strep  pharyngitis. Has a amoxicillin allergy will treat with Keflex.  3:13 PM:  I have discussed the diagnosis/risks/treatment options with the patient and family and believe the pt to be eligible for discharge home to follow-up with PCP. We also discussed returning to the ED immediately if new or worsening sx occur. We discussed the sx which are most concerning (e.g., sudden worsening pain, fever, inability to tolerate by mouth) that necessitate immediate return. Medications administered to the patient during their visit and any new prescriptions provided to the patient are listed below.  Medications given during this visit Medications  dexamethasone (DECADRON) tablet 10 mg (10 mg Oral Given 09/01/16 1510)     The patient appears reasonably screen  and/or stabilized for discharge and I doubt any other medical condition or other [facility] requiring further screening, evaluation, or treatment in the ED at this time prior to discharge.    Final Clinical Impressions(s) / ED Diagnoses   Final diagnoses:  Strep pharyngitis    New Prescriptions New Prescriptions   CEPHALEXIN (KEFLEX) 500 MG CAPSULE    Take 1 capsule (500 mg total) by mouth 4 (four) times daily.     Melene Plan, DO 09/01/16 1513

## 2016-09-01 NOTE — Discharge Instructions (Signed)
Take tylenol 2 pills 4 times a day and motrin 4 pills 3 times a day.  Drink plenty of fluids.  Return for worsening shortness of breath, headache, confusion. Follow up with your family doctor.   You need to continue to drink as much is possible to lubricate your throat. If you do not your symptoms will get significantly worse.

## 2016-09-09 NOTE — Telephone Encounter (Signed)
[facility] Please call Crystal after it has been faxed. ep

## 2016-09-10 NOTE — Telephone Encounter (Signed)
LM for crystal asking her to call back.  Please inform her that form was faxed around 0830 this morning.  Thanks [employer] BrandsJazmin Elyce Zollinger,CMA

## 2016-10-11 ENCOUNTER — Ambulatory Visit: Payer: Medicaid Other | Admitting: Family Medicine

## 2016-11-18 ENCOUNTER — Encounter (HOSPITAL_BASED_OUTPATIENT_CLINIC_OR_DEPARTMENT_OTHER): Payer: Self-pay | Admitting: Emergency Medicine

## 2016-11-18 ENCOUNTER — Emergency Department (HOSPITAL_BASED_OUTPATIENT_CLINIC_OR_DEPARTMENT_OTHER): Payer: Medicaid Other

## 2016-11-18 ENCOUNTER — Emergency Department (HOSPITAL_BASED_OUTPATIENT_CLINIC_OR_DEPARTMENT_OTHER)
Admission: EM | Admit: 2016-11-18 | Discharge: 2016-11-18 | Disposition: A | Discharge status: Home / Self Care | Payer: Medicaid Other | Attending: Emergency Medicine | Admitting: Emergency Medicine

## 2016-11-18 DIAGNOSIS — N3000 Acute cystitis without hematuria: Secondary | ICD-10-CM

## 2016-11-18 DIAGNOSIS — R197 Diarrhea, unspecified: Secondary | ICD-10-CM | POA: Insufficient documentation

## 2016-11-18 DIAGNOSIS — R112 Nausea with vomiting, unspecified: Secondary | ICD-10-CM | POA: Insufficient documentation

## 2016-11-18 DIAGNOSIS — Z79899 Other long term (current) drug therapy: Secondary | ICD-10-CM | POA: Diagnosis not present

## 2016-11-18 DIAGNOSIS — R1011 Right upper quadrant pain: Secondary | ICD-10-CM

## 2016-11-18 DIAGNOSIS — Z91040 Latex allergy status: Secondary | ICD-10-CM | POA: Diagnosis not present

## 2016-11-18 DIAGNOSIS — F909 Attention-deficit hyperactivity disorder, unspecified type: Secondary | ICD-10-CM | POA: Diagnosis not present

## 2016-11-18 DIAGNOSIS — Z87891 Personal history of nicotine dependence: Secondary | ICD-10-CM | POA: Insufficient documentation

## 2016-11-18 LAB — CBC WITH DIFFERENTIAL/PLATELET
BASOS ABS: 0 10*3/uL (ref 0.0–0.1)
BASOS PCT: 0 %
Eosinophils Absolute: 0.3 10*3/uL (ref 0.0–0.7)
Eosinophils Relative: 3 %
HEMATOCRIT: 38.3 % (ref 36.0–46.0)
HEMOGLOBIN: 12.9 g/dL (ref 12.0–15.0)
Lymphocytes Relative: 17 %
Lymphs Abs: 1.9 10*3/uL (ref 0.7–4.0)
MCH: 29.5 pg (ref 26.0–34.0)
MCHC: 33.7 g/dL (ref 30.0–36.0)
MCV: 87.4 fL (ref 78.0–100.0)
MONO ABS: 1.1 10*3/uL — AB (ref 0.1–1.0)
MONOS PCT: 10 %
NEUTROS ABS: 7.6 10*3/uL (ref 1.7–7.7)
Neutrophils Relative %: 70 %
Platelets: 219 10*3/uL (ref 150–400)
RBC: 4.38 MIL/uL (ref 3.87–5.11)
RDW: 13.8 % (ref 11.5–15.5)
WBC: 10.9 10*3/uL — ABNORMAL HIGH (ref 4.0–10.5)

## 2016-11-18 LAB — COMPREHENSIVE METABOLIC PANEL
ALBUMIN: 3.7 g/dL (ref 3.5–5.0)
ALT: 15 U/L (ref 14–54)
ANION GAP: 8 (ref 5–15)
AST: 18 U/L (ref 15–41)
Alkaline Phosphatase: 79 U/L (ref 38–126)
BUN: 10 mg/dL (ref 6–20)
CO2: 24 mmol/L (ref 22–32)
Calcium: 8.7 mg/dL — ABNORMAL LOW (ref 8.9–10.3)
Chloride: 107 mmol/L (ref 101–111)
Creatinine, Ser: 0.94 mg/dL (ref 0.44–1.00)
GFR calc Af Amer: >60 mL/min (ref >60)
GFR calc non Af Amer: >60 mL/min (ref >60)
GLUCOSE: 110 mg/dL — AB (ref 65–99)
POTASSIUM: 3.8 mmol/L (ref 3.5–5.1)
SODIUM: 139 mmol/L (ref 135–145)
Total Bilirubin: 0.3 mg/dL (ref 0.3–1.2)
Total Protein: 6.7 g/dL (ref 6.5–8.1)

## 2016-11-18 LAB — PREGNANCY, URINE: PREG TEST UR: NEGATIVE

## 2016-11-18 LAB — URINALYSIS, ROUTINE W REFLEX MICROSCOPIC
Bilirubin Urine: NEGATIVE
Glucose, UA: NEGATIVE mg/dL
HGB URINE DIPSTICK: NEGATIVE
Ketones, ur: NEGATIVE mg/dL
Nitrite: NEGATIVE
PROTEIN: NEGATIVE mg/dL
SPECIFIC GRAVITY, URINE: 1.01 (ref 1.005–1.030)
pH: 7.5 (ref 5.0–8.0)

## 2016-11-18 LAB — URINALYSIS, MICROSCOPIC (REFLEX)

## 2016-11-18 LAB — LIPASE, BLOOD: Lipase: 28 U/L (ref 11–51)

## 2016-11-18 MED ORDER — ONDANSETRON HCL 4 MG/2ML IJ SOLN
4.0000 mg | Freq: Once | INTRAMUSCULAR | Status: AC
  Administered 2016-11-18: 4 mg via INTRAVENOUS
  Filled 2016-11-18: qty 2

## 2016-11-18 MED ORDER — IOPAMIDOL (ISOVUE-300) INJECTION 61%
100.0000 mL | Freq: Once | INTRAVENOUS | Status: AC | PRN
  Administered 2016-11-18: 100 mL via INTRAVENOUS

## 2016-11-18 MED ORDER — IBUPROFEN 600 MG PO TABS: 600.0000 mg | ORAL_TABLET | Freq: Four times a day (QID) | ORAL | 0 refills | Status: DC | PRN

## 2016-11-18 MED ORDER — SODIUM CHLORIDE 0.9 % IV BOLUS (SEPSIS)
1000.0000 mL | Freq: Once | INTRAVENOUS | Status: AC
  Administered 2016-11-18: 1000 mL via INTRAVENOUS

## 2016-11-18 MED ORDER — FENTANYL CITRATE (PF) 100 MCG/2ML IJ SOLN
50.0000 ug | Freq: Once | INTRAMUSCULAR | Status: AC
  Administered 2016-11-18: 50 ug via INTRAVENOUS
  Filled 2016-11-18: qty 2

## 2016-11-18 MED ORDER — SULFAMETHOXAZOLE-TRIMETHOPRIM 800-160 MG PO TABS: 1.0000 | ORAL_TABLET | Freq: Two times a day (BID) | ORAL | 0 refills | Status: AC

## 2016-11-18 MED FILL — IBUPROFEN 600 MG TABLET: 600 | 8 days supply | Qty: 30 | Fill #0

## 2016-11-18 MED FILL — SULFAMETHOXAZOLE/TMP DS TAB: 800-160 | 3 days supply | Qty: 6 | Fill #0

## 2016-11-18 NOTE — ED Notes (Signed)
Patient transported to Ultrasound 

## 2016-11-18 NOTE — Discharge Instructions (Signed)
As discussed, please make sure you stay well-hydrated keeping your urine clear. Take her entire course of antibiotics even if you feel better. Take Tylenol or Ibuprofen for pain.  Follow-up with your primary care provider as needed. Return if symptoms worsen or if you experience increased pain, right lower abdominal pain, pain around your belly button or new concerning symptoms in the meantime.  Your ultrasound did not show any abnormalities today. Your CT scan did not show any acute abnormalities. There was a very small area on your right ovary that may represent a cyst, normal gallbladder, no kidney stone, normal appendix.

## 2016-11-18 NOTE — ED Notes (Signed)
Pt drank oral contrast with no difficulty.

## 2016-11-18 NOTE — ED Notes (Signed)
Pt in CT at this time.

## 2016-11-18 NOTE — ED Notes (Signed)
Upon entering room, pt noted to be sleeping; resp even/unlabored. Continues to rate pain 10/10.

## 2016-11-18 NOTE — ED Notes (Signed)
Patient transported to CT. Vitals will be obtained once patient returns.

## 2016-11-18 NOTE — ED Triage Notes (Signed)
Pt c/o RUQ pain x 3d w/ some NV and "somewhat diarrhea"

## 2016-11-18 NOTE — ED Provider Notes (Signed)
[facility]   CSN: [id number] Arrival date & time: 11/18/16  1006     History   Chief Complaint Chief Complaint  Patient presents with  . Abdominal Pain    HPI Cynthia Kent is a 26 y.o. female G1 P1 001 with a past medical history of acid reflux presenting with 3 days of gradual onset nausea/vomiting/nonbloody loose stools with colicky right upper quadrant pain. Patient reports she has never had this kind of pain before. Usually she has pain associated with ovarian cysts that is located in her left lower quadrant. Her pain today is in the right upper quadrant. She hasn't tried anything for the symptoms and hasn't noted any aggravating or alleviating factors.  She reports a decreased appetite from these symptoms and hasn't noticed a correlation with meals. She denies coffee-ground emesis but does report noticing some streaks of blood after multiple episodes of vomiting. Denies vaginal discharge, dysuria, hematuria, fever, chills. Prior abdominal surgeries include C-section LMP: 10/25/16 and lasted only 2 days. Patient reports irregular cycles at baseline. Denies being currently sexually active. She is an every day smoker approximately 1.5 pack a day, denies alcohol, reports marijuana use with last use a week ago.  HPI  Past Medical History:  Diagnosis Date  . Acid reflux disease   . ADHD (attention deficit hyperactivity disorder)   . Anxiety   . Bipolar disorder (HCC)   . Depression   . Drug-seeking behavior   . 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 Ovarian cyst   . Sleep apnea     Patient Active Problem List   Diagnosis Date Noted  . Attention deficit hyperactivity disorder (ADHD) 08/06/2016  . Bipolar disorder (HCC) 08/06/2016  . PTSD (post-traumatic stress disorder) 08/06/2016  . Anxiety and depression 08/06/2016  . Contraception management 08/06/2016  . Previous cesarean delivery, delivered 05/05/2016  . Snoring 11/17/2015  . History of  marijuana use 11/17/2015    Past Surgical History:  Procedure Laterality Date  . CESAREAN SECTION N/A 04/06/2016   Procedure: CESAREAN SECTION;  Surgeon: Levie Heritage, DO;  Location: [facility] BIRTHING SUITES;  Service: Obstetrics;  Laterality: N/A;  . KNEE SURGERY    . LEG SURGERY     when patient was 26 years old    OB History    Gravida Para Term Preterm AB Living   1 1 1     1    SAB TAB Ectopic Multiple Live Births         0 1       Home Medications    Prior to Admission medications   Medication Sig Start Date End Date Taking? Authorizing Provider  Escitalopram Oxalate (LEXAPRO PO) Take by mouth.   Yes [provider]  amphetamine-dextroamphetamine (ADDERALL) 20 MG tablet Take 20 mg by mouth daily.    [provider]  ibuprofen (ADVIL,MOTRIN) 600 MG tablet Take 1 tablet (600 mg total) by mouth every 6 (six) hours as needed. 11/18/16   Georgiana Shore, PA-C  norgestimate-ethinyl estradiol (SPRINTEC 28) 0.25-35 MG-MCG tablet Take 1 tablet by mouth daily. 08/06/16   Ardith Dark, MD  Prenatal MV-Min-FA-Omega-3 (PRENATAL GUMMIES/DHA & FA) 0.4-32.5 MG CHEW Chew 2 each by mouth daily.    [provider]  QUEtiapine (SEROQUEL) 100 MG tablet Take 150 mg by mouth at bedtime.    [provider]  sulfamethoxazole-trimethoprim (BACTRIM DS,SEPTRA DS) 800-160 MG tablet Take 1 tablet by mouth 2 (two) times daily. 11/18/16 11/21/16  Georgiana ShoreMitchell, Soua Caltagirone B, PA-C    Family History Family History  Problem Relation Age of Onset  . Cancer Maternal Grandmother   . Diabetes Neg Hx     Social History Social History  Substance Use Topics  . Smoking status: Former Smoker    Types: Cigarettes  . Smokeless tobacco: Never Used     Comment: Currently will take "a puff"   . Alcohol use No     Comment: not since pregnancy      Allergies   Amoxicillin; Hydrocodone; and Latex   Review of Systems Review of Systems  Constitutional: Positive for appetite  change. Negative for chills, diaphoresis and fever.  HENT: Negative for ear pain and sore throat.   Eyes: Negative for pain and visual disturbance.  Respiratory: Negative for cough, shortness of breath, wheezing and stridor.   Cardiovascular: Negative for chest pain and palpitations.  Gastrointestinal: Positive for abdominal pain, diarrhea, nausea and vomiting. Negative for abdominal distention and blood in stool.       Colicky right upper quadrant abdominal pain. Loose stools  Genitourinary: Negative for difficulty urinating, dysuria, flank pain, frequency, hematuria, pelvic pain, urgency, vaginal bleeding and vaginal discharge.  Musculoskeletal: Negative for arthralgias, back pain, myalgias, neck pain and neck stiffness.  Skin: Negative for color change, pallor and rash.  Neurological: Negative for dizziness, seizures, syncope, weakness, light-headedness and headaches.     Physical Exam Updated Vital Signs BP 125/85 (BP Location: Left Arm)   Pulse (!) 52   Temp 97.9 F (36.6 C) (Oral)   Resp 15   Ht 5\' 6"  (1.676 m)   Wt 93 kg (205 lb)   LMP 10/24/2016   SpO2 100%   Breastfeeding? No   BMI 33.09 kg/m   Physical Exam  Constitutional: She appears well-developed and well-nourished. No distress.  Afebrile, nontoxic-appearing, lying in bed in no discomfort with intermittent episodes of pain  HENT:  Head: Normocephalic and atraumatic.  Eyes: Conjunctivae and EOM are normal. Right eye exhibits no discharge. Left eye exhibits no discharge. No scleral icterus.  Neck: Normal range of motion. Neck supple.  Cardiovascular: Normal rate, regular rhythm and normal heart sounds.   No murmur heard. Pulmonary/Chest: Effort normal and breath sounds normal. No respiratory distress. She has no wheezes. She has no rales.  Abdominal: Soft. She exhibits no distension and no mass. There is tenderness. There is no rebound and no guarding.  Positive murphy's sign. Active bowel sounds. abdomen is  supple and non-tender to both light and deep palpation and no rebound tenderness. No palpated masses.  No costovertebral angle tenderness. Negative McBurney's point or periumbilical tenderness   Musculoskeletal: Normal range of motion. She exhibits no edema.  Neurological: She is alert.  Skin: Skin is warm and dry. No rash noted. She is not diaphoretic. No erythema. No pallor.  Psychiatric: She has a normal mood and affect.  Nursing note and vitals reviewed.    ED Treatments / Results  Labs (all labs ordered are listed, but only abnormal results are displayed) Labs Reviewed  COMPREHENSIVE METABOLIC PANEL - Abnormal; Notable for the following:       Result Value   Glucose, Bld 110 (*)    Calcium 8.7 (*)    All other components within normal limits  CBC WITH DIFFERENTIAL/PLATELET - Abnormal; Notable for the following:    WBC 10.9 (*)    Monocytes Absolute 1.1 (*)    All other components within normal limits  URINALYSIS, ROUTINE W REFLEX MICROSCOPIC -  Abnormal; Notable for the following:    Leukocytes, UA TRACE (*)    All other components within normal limits  URINALYSIS, MICROSCOPIC (REFLEX) - Abnormal; Notable for the following:    Bacteria, UA RARE (*)    Squamous Epithelial / LPF 0-5 (*)    All other components within normal limits  URINE CULTURE  LIPASE, BLOOD  PREGNANCY, URINE    EKG  EKG Interpretation None       Radiology Ct Abdomen Pelvis W Contrast  Result Date: 11/18/2016 CLINICAL DATA:  Right upper quadrant pain for several days with nausea and vomiting EXAM: CT ABDOMEN AND PELVIS WITH CONTRAST TECHNIQUE: Multidetector CT imaging of the abdomen and pelvis was performed using the standard protocol following bolus administration of intravenous contrast. CONTRAST:  ISOVUE-300 IOPAMIDOL (ISOVUE-300) INJECTION 61% COMPARISON:  Ultrasound from earlier in the same day, CT from 06/18/2015. FINDINGS: Lower chest: No acute abnormality. Hepatobiliary: No focal liver  abnormality is seen. No gallstones, gallbladder wall thickening, or biliary dilatation. Pancreas: Unremarkable. No pancreatic ductal dilatation or surrounding inflammatory changes. Spleen: Normal in size without focal abnormality. Adrenals/Urinary Tract: Adrenal glands are unremarkable. Kidneys are normal, without renal calculi, focal lesion, or hydronephrosis. Bladder is unremarkable. Stomach/Bowel: Stomach is within normal limits. Appendix appears normal. No evidence of bowel wall thickening, distention, or inflammatory changes. Vascular/Lymphatic: No significant vascular findings are present. No enlarged abdominal or pelvic lymph nodes. Reproductive: The uterus is within normal limits. A hazy small area of irregular enhancement is noted within the right ovary which may represent an involuting cyst. No free fluid is seen. No other ovarian abnormality is noted. Other: No abdominal wall hernia or abnormality. No abdominopelvic ascites. Musculoskeletal: No acute or significant osseous findings. IMPRESSION: Changes in the right ovary suggestive of an involuting cyst. No other focal abnormality is noted. Electronically Signed   By: Alcide Clever M.D.   On: 11/18/2016 16:14   US Abdomen Limited  Result Date: 11/18/2016 CLINICAL DATA:  Right upper quadrant pain. EXAM: ULTRASOUND ABDOMEN LIMITED RIGHT UPPER QUADRANT COMPARISON:  CT 06/18/2015. FINDINGS: Gallbladder: No gallstones or wall thickening visualized. No sonographic Murphy sign noted by sonographer. Common bile duct: Diameter: 1.9 mm Liver: No focal lesion identified. Within normal limits in parenchymal echogenicity. IMPRESSION: Normal exam. Electronically Signed   By: Maisie Fus  Register   On: 11/18/2016 11:42    Procedures Procedures (including critical care time)  Medications Ordered in ED Medications  sodium chloride 0.9 % bolus 1,000 mL (0 mLs Intravenous Stopped 11/18/16 1141)  ondansetron (ZOFRAN) injection 4 mg (4 mg Intravenous Given 11/18/16  1051)  fentaNYL (SUBLIMAZE) injection 50 mcg (50 mcg Intravenous Given 11/18/16 1058)  ondansetron (ZOFRAN) injection 4 mg (4 mg Intravenous Given 11/18/16 1520)  fentaNYL (SUBLIMAZE) injection 50 mcg (50 mcg Intravenous Given 11/18/16 1521)  iopamidol (ISOVUE-300) 61 % injection 100 mL (100 mLs Intravenous Contrast Given 11/18/16 1554)     Initial Impression / Assessment and Plan / ED Course  I have reviewed the triage vital signs and the nursing notes.  Pertinent labs & imaging results that were available during my care of the patient were reviewed by me and considered in my medical decision making (see chart for details).    Patient presenting with 3 days of right upper quadrant intermittent pain accompanied by nausea vomiting and loose stool. Positive Murphy sign on exam  Ordered IV fluids, labs, right upper quadrant ultrasound, analgesia and antiemetic and will reassess.  On reassessment, patient reported continued discomfort and nausea.  Patient given repeat analgesia and anti-emetic.  Ultrasound was normal. Ordered a CT  CT without acute intra-abdominal abnormalities. Small hazy area on the right ovary suspected to be a small ovarian cyst.  Patient was well-appearing, nontoxic and afebrile. She had significant improvement while in the emergency department and was ready go home. Will treat UTI and have her follow-up with her primary care provider.  Discharge home with symptomatic relief.  Discussed strict return precautions and advised to return to the emergency department if experiencing any new or worsening symptoms. Instructions were understood and patient agreed with discharge plan.  Final Clinical Impressions(s) / ED Diagnoses   Final diagnoses:  Right upper quadrant abdominal pain  Acute cystitis without hematuria    New Prescriptions Discharge Medication List as of 11/18/2016  4:45 PM    START taking these medications   Details  ibuprofen (ADVIL,MOTRIN) 600 MG  tablet Take 1 tablet (600 mg total) by mouth every 6 (six) hours as needed., Starting Thu 11/18/2016, Print    sulfamethoxazole-trimethoprim (BACTRIM DS,SEPTRA DS) 800-160 MG tablet Take 1 tablet by mouth 2 (two) times daily., Starting Thu 11/18/2016, Until Sun 11/21/2016, Print         Georgiana ShoreMitchell, Daemon Dowty B, PA-C 11/18/16 1849    Mesner, Barbara CowerJason, MD 11/19/16 [phone]

## 2016-11-20 LAB — URINE CULTURE: Culture: 50000 — AB

## 2016-11-21 ENCOUNTER — Telehealth: Payer: Self-pay

## 2016-11-21 NOTE — Telephone Encounter (Signed)
No further treatment for [facility] ED 11/18/16 [facility]

## 2016-12-09 ENCOUNTER — Encounter: Payer: Medicaid Other | Admitting: Family Medicine

## 2017-01-12 ENCOUNTER — Encounter: Payer: Medicaid Other | Admitting: Family Medicine

## 2017-01-17 ENCOUNTER — Encounter: Payer: Self-pay | Admitting: Family Medicine

## 2017-01-17 ENCOUNTER — Ambulatory Visit (INDEPENDENT_AMBULATORY_CARE_PROVIDER_SITE_OTHER): Payer: Medicaid Other | Admitting: Family Medicine

## 2017-01-17 VITALS — BP 125/75 | HR 75 | Temp 97.7°F | Wt 208.6 lb

## 2017-01-17 DIAGNOSIS — N926 Irregular menstruation, unspecified: Secondary | ICD-10-CM | POA: Diagnosis present

## 2017-01-17 DIAGNOSIS — F909 Attention-deficit hyperactivity disorder, unspecified type: Secondary | ICD-10-CM

## 2017-01-17 MED ORDER — AMPHETAMINE-DEXTROAMPHETAMINE 20 MG PO TABS: 20.0000 mg | ORAL_TABLET | Freq: Every day | ORAL | 0 refills | Status: DC

## 2017-01-17 NOTE — Patient Instructions (Signed)
It was great seeing you today! We have addressed the following issues today  1. I am restarting you on Adderall, since you have not been on it for a while let me know if you are having any adverse effect.   If we did any lab work today, and the results require attention, either me or my nurse will get in touch with you. If everything is normal, you will get a letter in mail and a message via . If you don't hear from [place] in two weeks, please give [place] a call. Otherwise, we look forward to seeing you again at your next visit. If you have any questions or concerns before then, please call the clinic at [phone].  Please bring all your medications to every doctors visit  Sign up for My Chart to have easy access to your labs results, and communication with your Primary care physician. Please ask Front Desk for some assistance.   Please check-out at the front desk before leaving the clinic.    Take Care,   Dr. Sydnee Cabal

## 2017-01-17 NOTE — Progress Notes (Signed)
   Subjective:    Patient ID: Cynthia Kent, female    DOB: 1990-07-25, 26 y.o.   MRN: [id number]   CC: Medication refill  HPI: Patient is a 26 year old with no significant past medical history who presented today to discuss medication refill for ADHD. Patient was diagnosed with ADHD when she was 26 years old and was on Adderall for many years. For the past 4-5 years patient has not needed therapy for ADHD. Patient has a therapist was treating her bipolar disorder. Patient is currently on Seroquel and Lexapro. Patient is at her baseline with no acute complaints. 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 Patient hasn't been able to maintain a job in the past few years due to behavioral issues.  Smoking status reviewed   ROS: all other systems were reviewed and are negative other than in the HPI   Past Medical History:  Diagnosis Date  . Acid reflux disease   . ADHD (attention deficit hyperactivity disorder)   . Anxiety   . Bipolar disorder (HCC)   . Depression   . Drug-seeking behavior   . 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 Ovarian cyst   . Sleep apnea     Past Surgical History:  Procedure Laterality Date  . CESAREAN SECTION N/A 04/06/2016   Procedure: CESAREAN SECTION;  Surgeon: Levie Heritage, DO;  Location: [facility] BIRTHING SUITES;  Service: Obstetrics;  Laterality: N/A;  . KNEE SURGERY    . LEG SURGERY     when patient was 26 years old    Past medical history, surgical, family, and social history reviewed and updated in the EMR as appropriate.  Objective:  BP 125/75   Pulse 75   Temp 97.7 F (36.5 C) (Oral)   Wt 208 lb 9.6 oz (94.6 kg)   SpO2 97%   BMI 33.67 kg/m   Vitals and nursing note reviewed  General: NAD, pleasant, able to participate in exam Cardiac: RRR, normal heart sounds, no murmurs. 2+ radial and PT pulses bilaterally Respiratory: CTAB, normal effort, No wheezes, rales or  rhonchi Abdomen: soft, nontender, nondistended, no hepatic or splenomegaly, +BS Extremities: no edema or cyanosis. WWP. Skin: warm and dry, no rashes noted Neuro: alert and oriented x4, no focal deficits Psych: Normal affect and mood   Assessment & Plan:   #ADHD, chronic, recently untreated Patient with a history of ADHD on Adderall in the past with no recent treatment in the past years. Patient's desire for reinitiation of treatment is secondary to new job. Patient follow-up with therapist. Only side effects that she's had in the past was low appetite while on Adderall. Discuss with patient coping mechanisms to help improve focus and curtail dependence on medication given lack of treatment over the past few years. --Refill Adderall 20 mg daily as needed --Patient will follow therapist --Will see her as needed   Lovena Neighbours, MD [facility] PGY-2

## 2017-04-21 ENCOUNTER — Other Ambulatory Visit: Payer: Self-pay | Admitting: *Deleted

## 2017-04-21 DIAGNOSIS — F909 Attention-deficit hyperactivity disorder, unspecified type: Secondary | ICD-10-CM

## 2017-04-21 MED ORDER — AMPHETAMINE-DEXTROAMPHETAMINE 20 MG PO TABS: 20.0000 mg | ORAL_TABLET | Freq: Every day | ORAL | 0 refills | Status: DC

## 2017-04-21 NOTE — Telephone Encounter (Signed)
Patient is here for son's appt and would like her medication refilled.  Will forward to MD. Burnard HawthorneJazmin Ahmaya Ostermiller,CMA

## 2017-04-26 DIAGNOSIS — O24419 Gestational diabetes mellitus in pregnancy, unspecified control: Secondary | ICD-10-CM

## 2017-04-26 HISTORY — DX: Gestational diabetes mellitus in pregnancy, unspecified control: O24.419

## 2017-04-27 ENCOUNTER — Telehealth: Payer: Self-pay | Admitting: Family Medicine

## 2017-04-27 NOTE — Telephone Encounter (Signed)
Called patient and informed her that script was printed last week when she was here with her son and handed to her.  Patient voiced understanding and will look at the paperwork she received that day. Jazmin Hartsell,CMA

## 2017-04-27 NOTE — Telephone Encounter (Signed)
Please send [facility]deral RX [facility] [address]

## 2017-06-02 ENCOUNTER — Encounter (HOSPITAL_BASED_OUTPATIENT_CLINIC_OR_DEPARTMENT_OTHER): Payer: Self-pay | Admitting: Emergency Medicine

## 2017-06-02 ENCOUNTER — Other Ambulatory Visit: Payer: Self-pay

## 2017-06-02 DIAGNOSIS — Z5321 Procedure and treatment not carried out due to patient leaving prior to being seen by health care provider: Secondary | ICD-10-CM | POA: Insufficient documentation

## 2017-06-02 DIAGNOSIS — R112 Nausea with vomiting, unspecified: Secondary | ICD-10-CM | POA: Diagnosis present

## 2017-06-02 NOTE — ED Triage Notes (Signed)
Pt presents with c/o nausea and vomiting and lack of appetite and possible pregnancy

## 2017-06-03 ENCOUNTER — Other Ambulatory Visit: Payer: Self-pay

## 2017-06-03 ENCOUNTER — Emergency Department (HOSPITAL_BASED_OUTPATIENT_CLINIC_OR_DEPARTMENT_OTHER): Payer: Medicaid Other

## 2017-06-03 ENCOUNTER — Emergency Department (HOSPITAL_BASED_OUTPATIENT_CLINIC_OR_DEPARTMENT_OTHER)
Admission: EM | Admit: 2017-06-03 | Discharge: 2017-06-03 | Disposition: A | Discharge status: Home / Self Care | Payer: Medicaid Other | Attending: Emergency Medicine | Admitting: Emergency Medicine

## 2017-06-03 ENCOUNTER — Encounter (HOSPITAL_BASED_OUTPATIENT_CLINIC_OR_DEPARTMENT_OTHER): Payer: Self-pay | Admitting: *Deleted

## 2017-06-03 ENCOUNTER — Emergency Department (HOSPITAL_BASED_OUTPATIENT_CLINIC_OR_DEPARTMENT_OTHER)
Admission: EM | Admit: 2017-06-03 | Discharge: 2017-06-03 | Disposition: A | Discharge status: Home / Self Care | Payer: Medicaid Other | Source: Home / Self Care | Attending: Emergency Medicine | Admitting: Emergency Medicine

## 2017-06-03 DIAGNOSIS — O210 Mild hyperemesis gravidarum: Secondary | ICD-10-CM

## 2017-06-03 LAB — URINALYSIS, ROUTINE W REFLEX MICROSCOPIC
BILIRUBIN URINE: NEGATIVE
GLUCOSE, UA: NEGATIVE mg/dL
HGB URINE DIPSTICK: NEGATIVE
KETONES UR: NEGATIVE mg/dL
Leukocytes, UA: NEGATIVE
Nitrite: NEGATIVE
PROTEIN: NEGATIVE mg/dL
Specific Gravity, Urine: >1.03 — ABNORMAL HIGH (ref 1.005–1.030)
pH: 6 (ref 5.0–8.0)

## 2017-06-03 LAB — COMPREHENSIVE METABOLIC PANEL
ALT: 14 U/L (ref 14–54)
AST: 17 U/L (ref 15–41)
Albumin: 4.2 g/dL (ref 3.5–5.0)
Alkaline Phosphatase: 56 U/L (ref 38–126)
Anion gap: 9 (ref 5–15)
BUN: 8 mg/dL (ref 6–20)
CO2: 22 mmol/L (ref 22–32)
Calcium: 9 mg/dL (ref 8.9–10.3)
Chloride: 105 mmol/L (ref 101–111)
Creatinine, Ser: 0.73 mg/dL (ref 0.44–1.00)
GFR calc Af Amer: >60 mL/min (ref >60)
GFR calc non Af Amer: >60 mL/min (ref >60)
Glucose, Bld: 94 mg/dL (ref 65–99)
Potassium: 3.5 mmol/L (ref 3.5–5.1)
Sodium: 136 mmol/L (ref 135–145)
Total Bilirubin: 0.6 mg/dL (ref 0.3–1.2)
Total Protein: 7.6 g/dL (ref 6.5–8.1)

## 2017-06-03 LAB — HCG, QUANTITATIVE, PREGNANCY: hCG, Beta Chain, Quant, S: 51061 m[IU]/mL — ABNORMAL HIGH (ref <5)

## 2017-06-03 LAB — WET PREP, GENITAL
Sperm: NONE SEEN
Trich, Wet Prep: NONE SEEN
Yeast Wet Prep HPF POC: NONE SEEN

## 2017-06-03 LAB — PREGNANCY, URINE: Preg Test, Ur: POSITIVE — AB

## 2017-06-03 MED ORDER — SODIUM CHLORIDE 0.9 % IV BOLUS (SEPSIS)
500.0000 mL | Freq: Once | INTRAVENOUS | Status: AC
  Administered 2017-06-03: 500 mL via INTRAVENOUS

## 2017-06-03 MED ORDER — METRONIDAZOLE 0.75 % VA GEL: 1.0000 | Freq: Every day | VAGINAL | 0 refills | Status: AC

## 2017-06-03 MED ORDER — ONDANSETRON HCL 4 MG/2ML IJ SOLN
4.0000 mg | Freq: Once | INTRAMUSCULAR | Status: AC
Start: 2017-06-03 — End: 2017-06-03
  Administered 2017-06-03: 4 mg via INTRAVENOUS
  Filled 2017-06-03: qty 2

## 2017-06-03 MED FILL — metroNIDAZOLE 0.75 % GEL: 0.75 | 5 days supply | Qty: 70 | Fill #0

## 2017-06-03 NOTE — Discharge Instructions (Signed)
Take doxylamine (generic for Unisom) 10 mg and pyridoxine (vitamin B6) 10 mg daily at bedtime.  If symptoms persist, you can repeat both in the morning.  Use MetroGel nightly for 5 days.  Begin taking prenatal vitamins as prescribed over-the-counter.  Please follow-up with OB/GYN for prenatal care and further management of your nausea and vomiting.  Please return to the emergency department or go to the [facility] emergency department if you develop any new or worsening symptoms.

## 2017-06-03 NOTE — ED Notes (Signed)
Patient transported to Ultrasound 

## 2017-06-03 NOTE — ED Notes (Signed)
In to give pt. zofran ... Pt. Is in US at this time.

## 2017-06-03 NOTE — ED Triage Notes (Signed)
States she had a positive pregnancy test. Her for nausea. No appetite.

## 2017-06-03 NOTE — ED Provider Notes (Signed)
[facility] EMERGENCY DEPARTMENT Provider Note   CSN: [id number] Arrival date & time: 06/03/17  1126     History   Chief Complaint Chief Complaint  Patient presents with  . Abdominal Pain    HPI Cynthia Kent is a 27 y.o. female G2 P1 who presents with positive pregnancy test at home and 2-3-week history of nausea and vomiting.  Patient has had associated intermittent pelvic pain and cramping, worse on the right side.  Patient reports she has not been able to keep fluids down over the past few days.  She feels like she is dehydrated.  She denies any other abdominal pain.  She denies urinary symptoms other than decreased considering she has not been able to drink very much.  She reports having trouble with nausea or vomiting during her first pregnancy, although does not remember having the abdominal pain she is describing.  She is not taking any medication at home for symptoms.  HPI  Past Medical History:  Diagnosis Date  . Acid reflux disease   . ADHD (attention deficit hyperactivity disorder)   . Anxiety   . Bipolar disorder (HCC)   . Depression   . Drug-seeking behavior   . 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 Ovarian cyst   . Sleep apnea     Patient Active Problem List   Diagnosis Date Noted  . Attention deficit hyperactivity disorder (ADHD) 08/06/2016  . Bipolar disorder (HCC) 08/06/2016  . PTSD (post-traumatic stress disorder) 08/06/2016  . Anxiety and depression 08/06/2016  . Contraception management 08/06/2016  . Previous cesarean delivery, delivered 05/05/2016  . Snoring 11/17/2015  . History of marijuana use 11/17/2015    Past Surgical History:  Procedure Laterality Date  . CESAREAN SECTION N/A 04/06/2016   Procedure: CESAREAN SECTION;  Surgeon: Levie Heritage, DO;  Location: [facility] BIRTHING SUITES;  Service: Obstetrics;  Laterality: N/A;  . KNEE SURGERY    . LEG SURGERY     when patient was 27 years old    OB History    Gravida Para Term  Preterm AB Living   2 1 1     1    SAB TAB Ectopic Multiple Live Births         0 1       Home Medications    Prior to Admission medications   Medication Sig Start Date End Date Taking? Authorizing Provider  amphetamine-dextroamphetamine (ADDERALL) 20 MG tablet Take 1 tablet (20 mg total) by mouth daily. 04/21/17   Diallo, Lilia Argue, MD  Escitalopram Oxalate (LEXAPRO PO) Take by mouth.    [provider]  ibuprofen (ADVIL,MOTRIN) 600 MG tablet Take 1 tablet (600 mg total) by mouth every 6 (six) hours as needed. 11/18/16   Mathews Robinsons B, PA-C  metroNIDAZOLE (METROGEL VAGINAL) 0.75 % vaginal gel Place 1 Applicatorful vaginally at bedtime for 5 days. 06/03/17 06/08/17  Emi Holes, PA-C  norgestimate-ethinyl estradiol (SPRINTEC 28) 0.25-35 MG-MCG tablet Take 1 tablet by mouth daily. 08/06/16   Ardith Dark, MD  Prenatal MV-Min-FA-Omega-3 (PRENATAL GUMMIES/DHA & FA) 0.4-32.5 MG CHEW Chew 2 each by mouth daily.    [provider]  QUEtiapine (SEROQUEL) 100 MG tablet Take 150 mg by mouth at bedtime.    [provider]    Family History Family History  Problem Relation Age of Onset  . Cancer Maternal Grandmother   . Diabetes Neg Hx     Social History Social History   Tobacco Use  .  Smoking status: Current Every Day Smoker    Types: Cigarettes  . Smokeless tobacco: Never Used  . Tobacco comment: Currently will take "a puff"   Substance Use Topics  . Alcohol use: No    Comment: not since pregnancy   . Drug use: No     Allergies   Amoxicillin; Hydrocodone; and Latex   Review of Systems Review of Systems  Constitutional: Negative for chills and fever.  HENT: Negative for facial swelling and sore throat.   Respiratory: Negative for shortness of breath.   Cardiovascular: Negative for chest pain.  Gastrointestinal: Positive for abdominal pain, nausea and vomiting.  Genitourinary: Positive for pelvic pain. Negative for dysuria, vaginal  bleeding and vaginal discharge.  Musculoskeletal: Negative for back pain.  Skin: Negative for rash and wound.  Neurological: Negative for headaches.  Psychiatric/Behavioral: The patient is not nervous/anxious.      Physical Exam Updated Vital Signs BP 126/68   Pulse (!) 57   Temp 98.8 F (37.1 C) (Oral)   Resp 16   Ht 5' 6.5" (1.689 m)   Wt 93.4 kg (206 lb)   LMP 04/15/2017   SpO2 100%   BMI 32.75 kg/m   Physical Exam  Constitutional: She appears well-developed and well-nourished. No distress.  HENT:  Head: Normocephalic and atraumatic.  Mouth/Throat: Oropharynx is clear and moist. No oropharyngeal exudate.  Mildly dry mucous membranes  Eyes: Conjunctivae are normal. Pupils are equal, round, and reactive to light. Right eye exhibits no discharge. Left eye exhibits no discharge. No scleral icterus.  Neck: Normal range of motion. Neck supple. No thyromegaly present.  Cardiovascular: Normal rate, regular rhythm, normal heart sounds and intact distal pulses. Exam reveals no gallop and no friction rub.  No murmur heard. Pulmonary/Chest: Effort normal and breath sounds normal. No stridor. No respiratory distress. She has no wheezes. She has no rales.  Abdominal: Soft. Bowel sounds are normal. She exhibits no distension. There is tenderness (Worse on the right) in the right lower quadrant and suprapubic area. There is no rebound, no guarding and no CVA tenderness.  Genitourinary: Uterus normal. Cervix exhibits no motion tenderness and no discharge. Right adnexum displays no tenderness. Left adnexum displays no tenderness. No vaginal discharge found.  Musculoskeletal: She exhibits no edema.  Lymphadenopathy:    She has no cervical adenopathy.  Neurological: She is alert. Coordination normal.  Skin: Skin is warm and dry. No rash noted. She is not diaphoretic. No pallor.  Psychiatric: She has a normal mood and affect.  Nursing note and vitals reviewed.    ED Treatments / Results    Labs (all labs ordered are listed, but only abnormal results are displayed) Labs Reviewed  WET PREP, GENITAL - Abnormal; Notable for the following components:      Result Value   Clue Cells Wet Prep HPF POC PRESENT (*)    WBC, Wet Prep HPF POC FEW (*)    All other components within normal limits  URINALYSIS, ROUTINE W REFLEX MICROSCOPIC - Abnormal; Notable for the following components:   Specific Gravity, Urine >1.030 (*)    All other components within normal limits  PREGNANCY, URINE - Abnormal; Notable for the following components:   Preg Test, Ur POSITIVE (*)    All other components within normal limits  HCG, QUANTITATIVE, PREGNANCY - Abnormal; Notable for the following components:   hCG, Beta Chain, Quant, S 51,061 (*)    All other components within normal limits  COMPREHENSIVE METABOLIC PANEL  GC/CHLAMYDIA PROBE AMP (  [place]) NOT AT [facility]    EKG  EKG Interpretation None       Radiology US Ob Comp < 14 Wks  Result Date: 06/03/2017 CLINICAL DATA:  27 year old female with right lower quadrant pain, nausea and vomiting. Quantitative beta HCG 51,061. Estimated gestational age by LMP 7 weeks 0 days. EXAM: OBSTETRIC <14 WK [place] AND TRANSVAGINAL OB US TECHNIQUE: Both transabdominal and transvaginal ultrasound examinations were performed for complete evaluation of the gestation as well as the maternal uterus, adnexal regions, and pelvic cul-de-sac. Transvaginal technique was performed to assess early pregnancy. COMPARISON:  CT Abdomen and Pelvis 11/18/2016 FINDINGS: Intrauterine gestational sac: Single Yolk sac:  Visible Embryo:  Visible Cardiac Activity: Detected Heart Rate: 135 bpm CRL:  9 mm   6 w   6 d                  [place] EDC: 01/21/2018 Subchorionic hemorrhage:  None visualized. Maternal uterus/adnexae: No pelvic free fluid. No of very in abnormality is identified. Incidental cervix nabothian cyst (image 23). IMPRESSION: Single living IUP demonstrated. No acute maternal findings  visualized. Electronically Signed   By: Odessa Fleming M.D.   On: 06/03/2017 16:56   US Ob Transvaginal  Result Date: 06/03/2017 CLINICAL DATA:  27 year old female with right lower quadrant pain, nausea and vomiting. Quantitative beta HCG 51,061. Estimated gestational age by LMP 7 weeks 0 days. EXAM: OBSTETRIC <14 WK [place] AND TRANSVAGINAL OB US TECHNIQUE: Both transabdominal and transvaginal ultrasound examinations were performed for complete evaluation of the gestation as well as the maternal uterus, adnexal regions, and pelvic cul-de-sac. Transvaginal technique was performed to assess early pregnancy. COMPARISON:  CT Abdomen and Pelvis 11/18/2016 FINDINGS: Intrauterine gestational sac: Single Yolk sac:  Visible Embryo:  Visible Cardiac Activity: Detected Heart Rate: 135 bpm CRL:  9 mm   6 w   6 d                  [place] EDC: 01/21/2018 Subchorionic hemorrhage:  None visualized. Maternal uterus/adnexae: No pelvic free fluid. No of very in abnormality is identified. Incidental cervix nabothian cyst (image 23). IMPRESSION: Single living IUP demonstrated. No acute maternal findings visualized. Electronically Signed   By: Odessa Fleming M.D.   On: 06/03/2017 16:56    Procedures Procedures (including critical care time)  Medications Ordered in ED Medications  sodium chloride 0.9 % bolus 500 mL (0 mLs Intravenous Stopped 06/03/17 1645)  ondansetron (ZOFRAN) injection 4 mg (4 mg Intravenous Given 06/03/17 1644)     Initial Impression / Assessment and Plan / ED Course  I have reviewed the triage vital signs and the nursing notes.  Pertinent labs & imaging results that were available during my care of the patient were reviewed by me and considered in my medical decision making (see chart for details).     Patient with hyperemesis gravidarum and probably normal pelvic pain during early pregnancy.  UA is negative.  HCG 51,061.  CMP unremarkable.  GC/chlamydia sent.  Wet prep shows clue cells.  Will initiate MetroGel  applicators.  Pelvic ultrasound shows single living IUP, no acute maternal findings visualized; 6 weeks, 6 days.  Will initiate prenatal vitamins (patient would like over-the-counter gummies) and recommend doxylamine and pyridoxine for hyperemesis.  Follow-up to OB/GYN for prenatal care.  Return precautions discussed.  Patient understands and agrees with plan.  Patient vitals stable throughout ED course and discharged in satisfactory condition.  Final Clinical Impressions(s) / ED Diagnoses   Final  diagnoses:  Hyperemesis gravidarum    ED Discharge Orders        Ordered    metroNIDAZOLE (METROGEL VAGINAL) 0.75 % vaginal gel  Daily at bedtime     06/03/17 [address], PA-C 06/03/17 1714    Benjiman Core, MD 06/04/17 [phone]

## 2017-06-06 LAB — GC/CHLAMYDIA PROBE AMP (~~LOC~~) NOT AT ARMC
Chlamydia: NEGATIVE
Neisseria Gonorrhea: NEGATIVE

## 2017-07-04 ENCOUNTER — Inpatient Hospital Stay (HOSPITAL_COMMUNITY)
Admission: AD | Admit: 2017-07-04 | Discharge: 2017-07-04 | Disposition: A | Discharge status: Home / Self Care | Payer: Medicaid Other | Source: Ambulatory Visit | Attending: Obstetrics & Gynecology | Admitting: Obstetrics & Gynecology

## 2017-07-04 ENCOUNTER — Encounter (HOSPITAL_COMMUNITY): Payer: Self-pay | Admitting: *Deleted

## 2017-07-04 DIAGNOSIS — Z87891 Personal history of nicotine dependence: Secondary | ICD-10-CM | POA: Insufficient documentation

## 2017-07-04 DIAGNOSIS — F4310 Post-traumatic stress disorder, unspecified: Secondary | ICD-10-CM | POA: Insufficient documentation

## 2017-07-04 DIAGNOSIS — O3481 Maternal care for other abnormalities of pelvic organs, first trimester: Secondary | ICD-10-CM | POA: Diagnosis not present

## 2017-07-04 DIAGNOSIS — Z79899 Other long term (current) drug therapy: Secondary | ICD-10-CM | POA: Diagnosis not present

## 2017-07-04 DIAGNOSIS — N83209 Unspecified ovarian cyst, unspecified side: Secondary | ICD-10-CM | POA: Insufficient documentation

## 2017-07-04 DIAGNOSIS — Z3A10 10 weeks gestation of pregnancy: Secondary | ICD-10-CM

## 2017-07-04 DIAGNOSIS — W109XXA Fall (on) (from) unspecified stairs and steps, initial encounter: Secondary | ICD-10-CM | POA: Diagnosis not present

## 2017-07-04 DIAGNOSIS — W19XXXA Unspecified fall, initial encounter: Secondary | ICD-10-CM

## 2017-07-04 DIAGNOSIS — Z765 Malingerer [conscious simulation]: Secondary | ICD-10-CM | POA: Insufficient documentation

## 2017-07-04 DIAGNOSIS — O99351 Diseases of the nervous system complicating pregnancy, first trimester: Secondary | ICD-10-CM | POA: Insufficient documentation

## 2017-07-04 DIAGNOSIS — O99341 Other mental disorders complicating pregnancy, first trimester: Secondary | ICD-10-CM | POA: Insufficient documentation

## 2017-07-04 DIAGNOSIS — Z885 Allergy status to narcotic agent status: Secondary | ICD-10-CM | POA: Insufficient documentation

## 2017-07-04 DIAGNOSIS — F319 Bipolar disorder, unspecified: Secondary | ICD-10-CM | POA: Diagnosis not present

## 2017-07-04 DIAGNOSIS — O26891 Other specified pregnancy related conditions, first trimester: Secondary | ICD-10-CM | POA: Diagnosis not present

## 2017-07-04 DIAGNOSIS — Z880 Allergy status to penicillin: Secondary | ICD-10-CM | POA: Diagnosis not present

## 2017-07-04 DIAGNOSIS — F909 Attention-deficit hyperactivity disorder, unspecified type: Secondary | ICD-10-CM | POA: Diagnosis not present

## 2017-07-04 DIAGNOSIS — O9989 Other specified diseases and conditions complicating pregnancy, childbirth and the puerperium: Secondary | ICD-10-CM

## 2017-07-04 DIAGNOSIS — M545 Low back pain: Secondary | ICD-10-CM | POA: Insufficient documentation

## 2017-07-04 DIAGNOSIS — M25511 Pain in right shoulder: Secondary | ICD-10-CM | POA: Diagnosis present

## 2017-07-04 DIAGNOSIS — G4730 Sleep apnea, unspecified: Secondary | ICD-10-CM | POA: Insufficient documentation

## 2017-07-04 DIAGNOSIS — K219 Gastro-esophageal reflux disease without esophagitis: Secondary | ICD-10-CM | POA: Insufficient documentation

## 2017-07-04 DIAGNOSIS — O99611 Diseases of the digestive system complicating pregnancy, first trimester: Secondary | ICD-10-CM | POA: Diagnosis not present

## 2017-07-04 HISTORY — DX: Post-traumatic stress disorder, unspecified: F43.10

## 2017-07-04 LAB — URINALYSIS, ROUTINE W REFLEX MICROSCOPIC
BILIRUBIN URINE: NEGATIVE
GLUCOSE, UA: NEGATIVE mg/dL
Hgb urine dipstick: NEGATIVE
Ketones, ur: 80 mg/dL — AB
Nitrite: NEGATIVE
PH: 5 (ref 5.0–8.0)
Protein, ur: NEGATIVE mg/dL
SPECIFIC GRAVITY, URINE: 1.024 (ref 1.005–1.030)

## 2017-07-04 MED ORDER — CYCLOBENZAPRINE HCL 10 MG PO TABS: 10.0000 mg | ORAL_TABLET | Freq: Two times a day (BID) | ORAL | 0 refills | Status: DC | PRN
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
## 2017-07-04 NOTE — MAU Provider Note (Signed)
History     CSN: [id number]  Arrival date and time: 07/04/17 1538   None     Chief Complaint  Patient presents with  . Fall   26 yo G2P1001 at [redacted]w[redacted]d gestation here following a fall down her stairs after her "leg gave out". She has an old leg injury that causes her to fall sometimes. Denies loss of consciousness. No vaginal bleeding or cramping. Now complains of low back pain and right shoulder pain.     Past Medical History:  Diagnosis Date  . Acid reflux disease   . ADHD (attention deficit hyperactivity disorder)   . Anxiety   . Bipolar disorder (HCC)   . Depression   . Drug-seeking behavior   . 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 Ovarian cyst   . PTSD (post-traumatic stress disorder)   . Sleep apnea     Past Surgical History:  Procedure Laterality Date  . CESAREAN SECTION N/A 04/06/2016   Procedure: CESAREAN SECTION;  Surgeon: Levie Heritage, DO;  Location: [facility] BIRTHING SUITES;  Service: Obstetrics;  Laterality: N/A;  . KNEE SURGERY    . LEG SURGERY     when patient was 27 years old    Family History  Problem Relation Age of Onset  . Cancer Maternal Grandmother   . Diabetes Neg Hx     Social History   Tobacco Use  . Smoking status: Former Smoker    Types: Cigarettes  . Smokeless tobacco: Never Used  . Tobacco comment: Currently will take "a puff"   Substance Use Topics  . Alcohol use: No    Comment: not since pregnancy   . Drug use: No    Comment: occasionally    Allergies:  Allergies  Allergen Reactions  . Amoxicillin Hives and Other (See Comments)    Has patient had a PCN reaction causing immediate rash, facial/tongue/throat swelling, SOB or lightheadedness with hypotension: No Has patient had a PCN reaction causing severe rash involving mucus membranes or skin necrosis: No Has patient had a PCN reaction that required hospitalization No Has patient had a PCN reaction occurring within the last 10 years: No If all of the above answers are "NO",  then may proceed with Cephalosporin use.  Marland Kitchen Hydrocodone Itching  . Latex Swelling and Rash    Medications Prior to Admission  Medication Sig Dispense Refill Last Dose  . amphetamine-dextroamphetamine (ADDERALL) 20 MG tablet Take 1 tablet (20 mg total) by mouth daily. 30 tablet 0   . Escitalopram Oxalate (LEXAPRO PO) Take by mouth.     Marland Kitchen ibuprofen (ADVIL,MOTRIN) 600 MG tablet Take 1 tablet (600 mg total) by mouth every 6 (six) hours as needed. 30 tablet 0   . norgestimate-ethinyl estradiol (SPRINTEC 28) 0.25-35 MG-MCG tablet Take 1 tablet by mouth daily. 1 Package 11   . Prenatal MV-Min-FA-Omega-3 (PRENATAL GUMMIES/DHA & FA) 0.4-32.5 MG CHEW Chew 2 each by mouth daily.   Taking  . QUEtiapine (SEROQUEL) 100 MG tablet Take 150 mg by mouth at bedtime.       Review of Systems  Constitutional: Negative for activity change and appetite change.  HENT: Negative for congestion and ear discharge.   Eyes: Negative for discharge and itching.  Respiratory: Negative for apnea and chest tightness.   Cardiovascular: Negative for chest pain and leg swelling.  Gastrointestinal: Negative for abdominal distention and abdominal pain.  Endocrine: Negative for cold intolerance.  Genitourinary: Negative for difficulty urinating and dysuria.  Musculoskeletal: Positive for back  pain. Negative for arthralgias.  Neurological: Negative for dizziness and light-headedness.  Hematological: Negative for adenopathy. Does not bruise/bleed easily.  Psychiatric/Behavioral: Negative for agitation and hallucinations.   Physical Exam   Blood pressure 117/74, pulse 83, temperature 99.1 F (37.3 C), temperature source Oral, resp. rate 16, last menstrual period 04/19/2017, SpO2 100 %, not currently breastfeeding.  Physical Exam  Constitutional: She is oriented to person, place, and time. She appears well-developed and well-nourished.  HENT:  Head: Normocephalic.  Eyes: Conjunctivae are normal. Pupils are equal, round, and  reactive to light.  Neck: Normal range of motion. Neck supple.  Cardiovascular: Intact distal pulses.  Respiratory: Effort normal. No respiratory distress.  GI: Soft. She exhibits no distension.  Musculoskeletal: Normal range of motion. She exhibits no edema.  Normal ROM of right shoulder. She has tenderness to palpation along muscles of lumbar spine. No spinal tenderness to palpation. No bony tenderness of right shoulder  Neurological: She is alert and oriented to person, place, and time.  Skin: Skin is warm and dry.  Psychiatric: She has a normal mood and affect. Her behavior is normal.    MAU Course  Procedures  MDM Bedside US shows FHT 160s.Exam is consistent with muscle spasm, not concerned for fracture since no bony tenderness to palpation.  Assessment and Plan  1.pregnancy with completed [redacted] weeks gestation- outpatient follow up 2. Musculoskeletal injury after a fall- flexeril and tylenol prn  Marga Gramajo 07/04/2017, 4:19 PM

## 2017-07-12 ENCOUNTER — Other Ambulatory Visit (HOSPITAL_COMMUNITY)
Admission: RE | Admit: 2017-07-12 | Discharge: 2017-07-12 | Disposition: A | Discharge status: Home / Self Care | Payer: Medicaid Other | Source: Ambulatory Visit | Attending: Certified Nurse Midwife | Admitting: Certified Nurse Midwife

## 2017-07-12 ENCOUNTER — Ambulatory Visit (INDEPENDENT_AMBULATORY_CARE_PROVIDER_SITE_OTHER): Payer: Medicaid Other | Admitting: Certified Nurse Midwife

## 2017-07-12 ENCOUNTER — Encounter: Payer: Self-pay | Admitting: Certified Nurse Midwife

## 2017-07-12 VITALS — BP 117/77 | HR 78 | Wt 202.0 lb

## 2017-07-12 DIAGNOSIS — A5901 Trichomonal vulvovaginitis: Secondary | ICD-10-CM | POA: Diagnosis not present

## 2017-07-12 DIAGNOSIS — O0990 Supervision of high risk pregnancy, unspecified, unspecified trimester: Secondary | ICD-10-CM | POA: Insufficient documentation

## 2017-07-12 DIAGNOSIS — Z3492 Encounter for supervision of normal pregnancy, unspecified, second trimester: Secondary | ICD-10-CM

## 2017-07-12 DIAGNOSIS — Z3482 Encounter for supervision of other normal pregnancy, second trimester: Secondary | ICD-10-CM | POA: Diagnosis not present

## 2017-07-12 DIAGNOSIS — Z3A00 Weeks of gestation of pregnancy not specified: Secondary | ICD-10-CM | POA: Diagnosis not present

## 2017-07-12 DIAGNOSIS — Z3490 Encounter for supervision of normal pregnancy, unspecified, unspecified trimester: Secondary | ICD-10-CM | POA: Diagnosis present

## 2017-07-12 DIAGNOSIS — O23599 Infection of other part of genital tract in pregnancy, unspecified trimester: Secondary | ICD-10-CM | POA: Insufficient documentation

## 2017-07-12 DIAGNOSIS — O219 Vomiting of pregnancy, unspecified: Secondary | ICD-10-CM

## 2017-07-12 DIAGNOSIS — Z98891 History of uterine scar from previous surgery: Secondary | ICD-10-CM

## 2017-07-12 MED ORDER — VITAFOL GUMMIES 3.33-0.333-34.8 MG PO CHEW
3.0000 | CHEWABLE_TABLET | Freq: Every day | ORAL | 12 refills | Status: DC
Start: 2017-07-12 — End: 2019-03-27

## 2017-07-12 MED ORDER — OB COMPLETE PETITE 35-5-1-200 MG PO CAPS: 1.0000 | ORAL_CAPSULE | Freq: Every day | ORAL | 12 refills | Status: DC

## 2017-07-12 MED ORDER — DOXYLAMINE-PYRIDOXINE 10-10 MG PO TBEC: DELAYED_RELEASE_TABLET | ORAL | 4 refills | Status: DC

## 2017-07-12 NOTE — Progress Notes (Signed)
Subjective:   Cynthia Kent is a 27 y.o. G2P1001 at 342w0d by LMP being seen today for her first obstetrical visit.  Her obstetrical history is significant for obesity and previous C-section X1. Patient does intend to breast feed. Pregnancy history fully reviewed.  Patient reports nausea, no bleeding, no contractions, no cramping, no leaking and vomiting.  HISTORY: Obstetric History   G2   P1   T1   P0   A0   L1    SAB0   TAB0   Ectopic0   Multiple0   Live Births1     # Outcome Date GA Lbr Len/2nd Weight Sex Delivery Anes PTL Lv  2 Current           1 Term 04/06/16 8162w4d  8 lb 14.9 oz (4.05 kg) M CS-LTranv EPI  LIV     Name: Kent,BOY Elnora     Apgar1:  8               Apgar5: 10      Last pap smear was done unknown  Past Medical History:  Diagnosis Date  . Acid reflux disease   . ADHD (attention deficit hyperactivity disorder)   . Anxiety   . Bipolar disorder (HCC)   . Depression   . Drug-seeking behavior   . 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 Ovarian cyst   . PTSD (post-traumatic stress disorder)   . Sleep apnea    Past Surgical History:  Procedure Laterality Date  . CESAREAN SECTION N/A 04/06/2016   Procedure: CESAREAN SECTION;  Surgeon: Levie HeritageJacob J Stinson, DO;  [facility];  Service: Obstetrics;  Laterality: N/A;  . KNEE SURGERY    . LEG SURGERY     when patient was 27 years old   Family History  Problem Relation Age of Onset  . Hypertension Mother   . Cancer Maternal Grandmother   . Diabetes Neg Hx    Social History   Tobacco Use  . Smoking status: Former Smoker    Types: Cigarettes  . Smokeless tobacco: Never Used  . Tobacco comment: Currently will take "a puff"   Substance Use Topics  . Alcohol use: No    Comment: not since pregnancy   . Drug use: No    Comment: last used 1 month ago   Allergies  Allergen Reactions  . Amoxicillin Hives and Other (See Comments)    Has patient had a PCN reaction causing immediate  rash, facial/tongue/throat swelling, SOB or lightheadedness with hypotension: No Has patient had a PCN reaction causing severe rash involving mucus membranes or skin necrosis: No Has patient had a PCN reaction that required hospitalization No Has patient had a PCN reaction occurring within the last 10 years: No If all of the above answers are "NO", then may proceed with Cephalosporin use.  Marland Kitchen. Hydrocodone Itching  . Latex Swelling and Rash   Current Outpatient Medications on File Prior to Visit  Medication Sig Dispense Refill  . Prenatal MV-Min-FA-Omega-3 (PRENATAL GUMMIES/DHA & FA) 0.4-32.5 MG CHEW Chew 2 each by mouth daily.    . Escitalopram Oxalate (LEXAPRO PO) Take by mouth.     No current facility-administered medications on file prior to visit.     Review of Systems Pertinent items noted in HPI and remainder of comprehensive ROS otherwise negative.  Exam   Vitals:   07/12/17 1034  BP: 117/77  Pulse: 78  Weight: 202 lb (91.6 kg)   Fetal  Heart Rate (bpm): 156; doppler  Uterus:     Pelvic Exam: Perineum: no hemorrhoids, normal perineum   Vulva: normal external genitalia, no lesions   Vagina:  normal mucosa, normal discharge   Cervix: no lesions and normal, pap smear done.    Adnexa: normal adnexa and no mass, fullness, tenderness   Bony Pelvis: average  System: General: well-developed, well-nourished female in no acute distress   Breast:  normal appearance, no masses or tenderness   Skin: normal coloration and turgor, no rashes   Neurologic: oriented, normal, negative, normal mood   Extremities: normal strength, tone, and muscle mass, ROM of all joints is normal   HEENT PERRLA, extraocular movement intact and sclera clear, anicteric   Mouth/Teeth mucous membranes moist, pharynx normal without lesions and dental hygiene good   Neck supple and no masses   Cardiovascular: regular rate and rhythm   Respiratory:  no respiratory distress, normal breath sounds   Abdomen:  soft, non-tender; bowel sounds normal; no masses,  no organomegaly     Assessment:   Pregnancy: G2P1001 Patient Active Problem List   Diagnosis Date Noted  . Encounter for supervision of normal pregnancy, unspecified, unspecified trimester 07/12/2017  . Attention deficit hyperactivity disorder (ADHD) 08/06/2016  . Bipolar disorder (HCC) 08/06/2016  . PTSD (post-traumatic stress disorder) 08/06/2016  . Anxiety and depression 08/06/2016  . Contraception management 08/06/2016  . History of C-section 05/05/2016  . Snoring 11/17/2015  . History of marijuana use 11/17/2015     Plan:  1. Encounter for supervision of normal pregnancy, antepartum, unspecified gravidity      - Cytology - PAP - Cervicovaginal ancillary only - Culture, OB Urine - Cystic Fibrosis Mutation 97 - Hemoglobinopathy evaluation - Obstetric Panel, Including HIV - VITAMIN D 25 Hydroxy (Vit-D Deficiency, Fractures) - Genetic Screening - Hemoglobin A1c - Prenat-FeCbn-FeAspGl-FA-Omega (OB COMPLETE PETITE) 35-5-1-200 MG CAPS; Take 1 tablet by mouth daily.  Dispense: 30 capsule; Refill: 12 - Prenatal Vit-Fe Phos-FA-Omega (VITAFOL GUMMIES) 3.33-0.333-34.8 MG CHEW; Chew 3 tablets by mouth at bedtime.  Dispense: 90 tablet; Refill: 12  2. History of C-section     TOLAC; papers completed today.   3. Nausea/vomiting in pregnancy      - Doxylamine-Pyridoxine (DICLEGIS) 10-10 MG TBEC; Take 1 tablet with breakfast and lunch.  Take 2 tablets at bedtime.  Dispense: 100 tablet; Refill: 4   Initial labs drawn. Continue prenatal vitamins. Genetic Screening discussed, NIPS: ordered. Ultrasound discussed; fetal anatomic survey: ordered. Problem list reviewed and updated. The nature of [facility] with multiple MDs and other Advanced Practice Providers was explained to patient; also emphasized that residents, students are part of our team. Routine obstetric precautions reviewed. Return in  about 4 weeks (around 08/09/2017) for ROB.     Orvilla Cornwall, CNM [facility] [employer], [facility]

## 2017-07-13 LAB — CERVICOVAGINAL ANCILLARY ONLY
Bacterial vaginitis: NEGATIVE
CHLAMYDIA, DNA PROBE: NEGATIVE
Candida vaginitis: NEGATIVE
NEISSERIA GONORRHEA: NEGATIVE
Trichomonas: POSITIVE — AB

## 2017-07-14 LAB — OBSTETRIC PANEL, INCLUDING HIV
ANTIBODY SCREEN: NEGATIVE
BASOS: 0 %
Basophils Absolute: 0 10*3/uL (ref 0.0–0.2)
EOS (ABSOLUTE): 0.1 10*3/uL (ref 0.0–0.4)
Eos: 1 %
HEMATOCRIT: 37.1 % (ref 34.0–46.6)
HIV SCREEN 4TH GENERATION: NONREACTIVE
Hemoglobin: 12.5 g/dL (ref 11.1–15.9)
Hepatitis B Surface Ag: NEGATIVE
IMMATURE GRANS (ABS): 0 10*3/uL (ref 0.0–0.1)
Immature Granulocytes: 0 %
LYMPHS ABS: 2.2 10*3/uL (ref 0.7–3.1)
LYMPHS: 22 %
MCH: 30.8 pg (ref 26.6–33.0)
MCHC: 33.7 g/dL (ref 31.5–35.7)
MCV: 91 fL (ref 79–97)
MONOS ABS: 0.5 10*3/uL (ref 0.1–0.9)
Monocytes: 5 %
NEUTROS PCT: 72 %
Neutrophils Absolute: 7 10*3/uL (ref 1.4–7.0)
Platelets: 242 10*3/uL (ref 150–379)
RBC: 4.06 x10E6/uL (ref 3.77–5.28)
RDW: 14.3 % (ref 12.3–15.4)
RH TYPE: POSITIVE
RPR Ser Ql: NONREACTIVE
Rubella Antibodies, IGG: <0.9 index — ABNORMAL LOW (ref >0.99)
WBC: 9.7 10*3/uL (ref 3.4–10.8)

## 2017-07-14 LAB — HEMOGLOBINOPATHY EVALUATION
HEMOGLOBIN A2 QUANTITATION: 2.1 % (ref 1.8–3.2)
HEMOGLOBIN F QUANTITATION: 0 % (ref 0.0–2.0)
HGB A: 97.9 % (ref 96.4–98.8)
HGB C: 0 %
HGB S: 0 %
HGB VARIANT: 0 %

## 2017-07-14 LAB — URINE CULTURE, OB REFLEX

## 2017-07-14 LAB — CULTURE, OB URINE

## 2017-07-14 LAB — CYTOLOGY - PAP: Diagnosis: NEGATIVE

## 2017-07-14 LAB — VITAMIN D 25 HYDROXY (VIT D DEFICIENCY, FRACTURES): VIT D 25 HYDROXY: 19 ng/mL — AB (ref 30.0–100.0)

## 2017-07-14 LAB — HEMOGLOBIN A1C
Est. average glucose Bld gHb Est-mCnc: 111 mg/dL
Hgb A1c MFr Bld: 5.5 % (ref 4.8–5.6)

## 2017-07-19 LAB — CYSTIC FIBROSIS MUTATION 97: GENE DIS ANAL CARRIER INTERP BLD/T-IMP: NOT DETECTED

## 2017-07-22 ENCOUNTER — Other Ambulatory Visit: Payer: Self-pay | Admitting: Certified Nurse Midwife

## 2017-07-22 DIAGNOSIS — Z3480 Encounter for supervision of other normal pregnancy, unspecified trimester: Secondary | ICD-10-CM

## 2017-08-01 ENCOUNTER — Telehealth: Payer: Self-pay

## 2017-08-01 NOTE — Telephone Encounter (Signed)
Patient needs to see Dr. For [facility] And for vaginal discharge. [facility].

## 2017-08-02 ENCOUNTER — Ambulatory Visit (INDEPENDENT_AMBULATORY_CARE_PROVIDER_SITE_OTHER): Payer: Medicaid Other | Admitting: Certified Nurse Midwife

## 2017-08-02 VITALS — BP 126/84 | HR 73 | Wt 207.0 lb

## 2017-08-02 DIAGNOSIS — O23592 Infection of other part of genital tract in pregnancy, second trimester: Secondary | ICD-10-CM

## 2017-08-02 DIAGNOSIS — Z3480 Encounter for supervision of other normal pregnancy, unspecified trimester: Secondary | ICD-10-CM

## 2017-08-02 DIAGNOSIS — O09899 Supervision of other high risk pregnancies, unspecified trimester: Secondary | ICD-10-CM | POA: Insufficient documentation

## 2017-08-02 DIAGNOSIS — O9989 Other specified diseases and conditions complicating pregnancy, childbirth and the puerperium: Secondary | ICD-10-CM

## 2017-08-02 DIAGNOSIS — Z283 Underimmunization status: Secondary | ICD-10-CM

## 2017-08-02 DIAGNOSIS — B373 Candidiasis of vulva and vagina: Secondary | ICD-10-CM

## 2017-08-02 DIAGNOSIS — Z98891 History of uterine scar from previous surgery: Secondary | ICD-10-CM

## 2017-08-02 DIAGNOSIS — A5901 Trichomonal vulvovaginitis: Secondary | ICD-10-CM

## 2017-08-02 DIAGNOSIS — B3731 Acute candidiasis of vulva and vagina: Secondary | ICD-10-CM

## 2017-08-02 DIAGNOSIS — E559 Vitamin D deficiency, unspecified: Secondary | ICD-10-CM | POA: Insufficient documentation

## 2017-08-02 MED ORDER — TERCONAZOLE 0.8 % VA CREA: 1.0000 | TOPICAL_CREAM | Freq: Every day | VAGINAL | 0 refills | Status: DC

## 2017-08-02 MED ORDER — METRONIDAZOLE 500 MG PO TABS: 2000.0000 mg | ORAL_TABLET | Freq: Once | ORAL | 0 refills | Status: AC

## 2017-08-02 MED ORDER — FLUCONAZOLE 150 MG PO TABS: 150.0000 mg | ORAL_TABLET | Freq: Once | ORAL | 0 refills | Status: AC

## 2017-08-02 MED ORDER — VITAMIN D (ERGOCALCIFEROL) 1.25 MG (50000 UNIT) PO CAPS: 50000.0000 [IU] | ORAL_CAPSULE | ORAL | 2 refills | Status: DC

## 2017-08-02 NOTE — Progress Notes (Signed)
   PRENATAL VISIT NOTE  Subjective:  Cynthia Kent is a 27 y.o. G2P1001 at 10w0dbeing seen today for ongoing prenatal care.  She is currently monitored for the following issues for this low-risk pregnancy and has Snoring; History of marijuana use; History of C-section; Attention deficit hyperactivity disorder (ADHD); Bipolar disorder ([place]; PTSD (post-traumatic stress disorder); Anxiety and depression; Contraception management; Encounter for supervision of normal pregnancy, unspecified, unspecified trimester; Rubella non-immune status, antepartum; and Vitamin D deficiency on their problem list.  Patient reports no bleeding, no contractions, no cramping, no leaking and vaginal irritation.  Contractions: Not present. Vag. Bleeding: Scant.   . Denies leaking of fluid.   The following portions of the patient's history were reviewed and updated as appropriate: allergies, current medications, past family history, past medical history, past social history, past surgical history and problem list. Problem list updated.  Objective:   Vitals:   08/02/17 1540 08/02/17 1618  BP: (!) 141/83 126/84  Pulse: (!) 106 73  Weight: 207 lb (93.9 kg)     Fetal Status: Fetal Heart Rate (bpm): 147; doppler         General:  Alert, oriented and cooperative. Patient is in no acute distress.  Skin: Skin is warm and dry. No rash noted.   Cardiovascular: Normal heart rate noted  Respiratory: Normal respiratory effort, no problems with respiration noted  Abdomen: Soft, gravid, appropriate for gestational age.  Pain/Pressure: Absent     Pelvic: Cervical exam deferred        Extremities: Normal range of motion.     Mental Status: Normal mood and affect. Normal behavior. Normal judgment and thought content.   Assessment and Plan:  Pregnancy: G2P1001 at 167w0d1. Supervision of other normal pregnancy, antepartum     Anatomy USKoreacheduled. Normotensive on recheck after calming down.  - AFP, Serum, Open Spina  Bifida  2. History of C-section     TOLAC  3. Rubella non-immune status, antepartum     MMR postpartum  4. Vitamin D deficiency    Vitamin D ordered - Vitamin D, Ergocalciferol, (DRISDOL) 50000 units CAPS capsule; Take 1 capsule (50,000 Units total) by mouth every 7 (seven) days.  Dispense: 30 capsule; Refill: 2  5. Trichomonal vaginitis during pregnancy in second trimester        - metroNIDAZOLE (FLAGYL) 500 MG tablet; Take 4 tablets (2,000 mg total) by mouth once for 1 dose.  Dispense: 4 tablet; Refill: 0  6. Yeast vaginitis      - fluconazole (DIFLUCAN) 150 MG tablet; Take 1 tablet (150 mg total) by mouth once for 1 dose.  Dispense: 1 tablet; Refill: 0 - terconazole (TERAZOL 3) 0.8 % vaginal cream; Place 1 applicator vaginally at bedtime.  Dispense: 20 g; Refill: 0  Preterm labor symptoms and general obstetric precautions including but not limited to vaginal bleeding, contractions, leaking of fluid and fetal movement were reviewed in detail with the patient. Please refer to After Visit Summary for other counseling recommendations.  Return in about 1 month (around 08/30/2017) for [place] Appoint[place]  Date Time Provider DeLindstrom4/16/2019  1:30 PM DeMorene CrockerCNM [facility] None  08/26/2017  1:30 PM WHGladbrookSKorea Monroeenney, CNM

## 2017-08-05 LAB — AFP, SERUM, OPEN SPINA BIFIDA
AFP MOM: 1.5
AFP VALUE AFPOSL: 35 ng/mL
Gest. Age on Collection Date: 15 weeks
Maternal Age At EDD: 28.2 yr
OSBR RISK 1 IN: 5468
Test Results:: NEGATIVE
WEIGHT: 207 [lb_av]

## 2017-08-08 ENCOUNTER — Other Ambulatory Visit: Payer: Self-pay | Admitting: Certified Nurse Midwife

## 2017-08-08 DIAGNOSIS — Z3480 Encounter for supervision of other normal pregnancy, unspecified trimester: Secondary | ICD-10-CM

## 2017-08-09 ENCOUNTER — Encounter: Payer: Medicaid Other | Admitting: Certified Nurse Midwife

## 2017-08-26 ENCOUNTER — Other Ambulatory Visit: Payer: Self-pay | Admitting: Certified Nurse Midwife

## 2017-08-26 ENCOUNTER — Ambulatory Visit (HOSPITAL_COMMUNITY)
Admission: RE | Admit: 2017-08-26 | Discharge: 2017-08-26 | Disposition: A | Discharge status: Home / Self Care | Payer: Medicaid Other | Source: Ambulatory Visit | Attending: Certified Nurse Midwife | Admitting: Certified Nurse Midwife

## 2017-08-26 DIAGNOSIS — Z3689 Encounter for other specified antenatal screening: Secondary | ICD-10-CM

## 2017-08-26 DIAGNOSIS — Z363 Encounter for antenatal screening for malformations: Secondary | ICD-10-CM | POA: Diagnosis not present

## 2017-08-26 DIAGNOSIS — O99212 Obesity complicating pregnancy, second trimester: Secondary | ICD-10-CM

## 2017-08-26 DIAGNOSIS — Z3490 Encounter for supervision of normal pregnancy, unspecified, unspecified trimester: Secondary | ICD-10-CM

## 2017-08-26 DIAGNOSIS — O34219 Maternal care for unspecified type scar from previous cesarean delivery: Secondary | ICD-10-CM | POA: Diagnosis not present

## 2017-08-26 DIAGNOSIS — Z3A19 19 weeks gestation of pregnancy: Secondary | ICD-10-CM

## 2017-08-26 DIAGNOSIS — Z98891 History of uterine scar from previous surgery: Secondary | ICD-10-CM

## 2017-08-29 ENCOUNTER — Other Ambulatory Visit: Payer: Self-pay | Admitting: Certified Nurse Midwife

## 2017-08-29 DIAGNOSIS — Z3480 Encounter for supervision of other normal pregnancy, unspecified trimester: Secondary | ICD-10-CM

## 2017-08-30 ENCOUNTER — Other Ambulatory Visit: Payer: Self-pay

## 2017-08-30 ENCOUNTER — Other Ambulatory Visit (HOSPITAL_COMMUNITY)
Admission: RE | Admit: 2017-08-30 | Discharge: 2017-08-30 | Disposition: A | Discharge status: Home / Self Care | Payer: Medicaid Other | Source: Ambulatory Visit | Attending: Certified Nurse Midwife | Admitting: Certified Nurse Midwife

## 2017-08-30 ENCOUNTER — Encounter: Payer: Self-pay | Admitting: Certified Nurse Midwife

## 2017-08-30 ENCOUNTER — Ambulatory Visit (INDEPENDENT_AMBULATORY_CARE_PROVIDER_SITE_OTHER): Payer: Medicaid Other | Admitting: Certified Nurse Midwife

## 2017-08-30 VITALS — BP 133/75 | HR 87 | Wt 218.0 lb

## 2017-08-30 DIAGNOSIS — O23592 Infection of other part of genital tract in pregnancy, second trimester: Secondary | ICD-10-CM | POA: Insufficient documentation

## 2017-08-30 DIAGNOSIS — O9989 Other specified diseases and conditions complicating pregnancy, childbirth and the puerperium: Secondary | ICD-10-CM

## 2017-08-30 DIAGNOSIS — T7491XA Unspecified adult maltreatment, confirmed, initial encounter: Secondary | ICD-10-CM

## 2017-08-30 DIAGNOSIS — O09899 Supervision of other high risk pregnancies, unspecified trimester: Secondary | ICD-10-CM

## 2017-08-30 DIAGNOSIS — X58XXXA Exposure to other specified factors, initial encounter: Secondary | ICD-10-CM | POA: Insufficient documentation

## 2017-08-30 DIAGNOSIS — Z3A20 20 weeks gestation of pregnancy: Secondary | ICD-10-CM | POA: Insufficient documentation

## 2017-08-30 DIAGNOSIS — A5901 Trichomonal vulvovaginitis: Secondary | ICD-10-CM | POA: Diagnosis not present

## 2017-08-30 DIAGNOSIS — F419 Anxiety disorder, unspecified: Secondary | ICD-10-CM | POA: Diagnosis not present

## 2017-08-30 DIAGNOSIS — F909 Attention-deficit hyperactivity disorder, unspecified type: Secondary | ICD-10-CM | POA: Diagnosis not present

## 2017-08-30 DIAGNOSIS — E559 Vitamin D deficiency, unspecified: Secondary | ICD-10-CM

## 2017-08-30 DIAGNOSIS — O99212 Obesity complicating pregnancy, second trimester: Secondary | ICD-10-CM | POA: Diagnosis not present

## 2017-08-30 DIAGNOSIS — F319 Bipolar disorder, unspecified: Secondary | ICD-10-CM | POA: Diagnosis not present

## 2017-08-30 DIAGNOSIS — Z3482 Encounter for supervision of other normal pregnancy, second trimester: Secondary | ICD-10-CM | POA: Diagnosis present

## 2017-08-30 DIAGNOSIS — O99342 Other mental disorders complicating pregnancy, second trimester: Secondary | ICD-10-CM | POA: Diagnosis not present

## 2017-08-30 DIAGNOSIS — F4310 Post-traumatic stress disorder, unspecified: Secondary | ICD-10-CM | POA: Diagnosis not present

## 2017-08-30 DIAGNOSIS — Z98891 History of uterine scar from previous surgery: Secondary | ICD-10-CM

## 2017-08-30 DIAGNOSIS — Z283 Underimmunization status: Secondary | ICD-10-CM

## 2017-08-30 DIAGNOSIS — O23599 Infection of other part of genital tract in pregnancy, unspecified trimester: Secondary | ICD-10-CM

## 2017-08-30 DIAGNOSIS — O34219 Maternal care for unspecified type scar from previous cesarean delivery: Secondary | ICD-10-CM

## 2017-08-30 DIAGNOSIS — Z3480 Encounter for supervision of other normal pregnancy, unspecified trimester: Secondary | ICD-10-CM

## 2017-08-30 NOTE — Progress Notes (Signed)
ROB/TOC.  Reports no problems today. 
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
## 2017-08-31 LAB — CERVICOVAGINAL ANCILLARY ONLY
BACTERIAL VAGINITIS: NEGATIVE
Candida vaginitis: NEGATIVE
Chlamydia: NEGATIVE
NEISSERIA GONORRHEA: NEGATIVE
TRICH (WINDOWPATH): POSITIVE — AB

## 2017-09-05 ENCOUNTER — Other Ambulatory Visit: Payer: Self-pay | Admitting: Certified Nurse Midwife

## 2017-09-05 DIAGNOSIS — O23592 Infection of other part of genital tract in pregnancy, second trimester: Principal | ICD-10-CM

## 2017-09-05 DIAGNOSIS — A5901 Trichomonal vulvovaginitis: Secondary | ICD-10-CM

## 2017-09-05 MED ORDER — TINIDAZOLE 500 MG PO TABS: 2.0000 g | ORAL_TABLET | Freq: Every day | ORAL | 0 refills | Status: DC

## 2017-09-05 MED ORDER — SECNIDAZOLE 2 G PO PACK: 1.0000 | PACK | Freq: Once | ORAL | 0 refills | Status: AC

## 2017-09-27 ENCOUNTER — Ambulatory Visit (INDEPENDENT_AMBULATORY_CARE_PROVIDER_SITE_OTHER): Payer: Medicaid Other | Admitting: Certified Nurse Midwife

## 2017-09-27 ENCOUNTER — Other Ambulatory Visit: Payer: Self-pay

## 2017-09-27 ENCOUNTER — Encounter: Payer: Self-pay | Admitting: Certified Nurse Midwife

## 2017-09-27 VITALS — BP 124/75 | HR 72 | Wt 223.2 lb

## 2017-09-27 DIAGNOSIS — H109 Unspecified conjunctivitis: Secondary | ICD-10-CM

## 2017-09-27 DIAGNOSIS — O09899 Supervision of other high risk pregnancies, unspecified trimester: Secondary | ICD-10-CM

## 2017-09-27 DIAGNOSIS — Z3480 Encounter for supervision of other normal pregnancy, unspecified trimester: Secondary | ICD-10-CM

## 2017-09-27 DIAGNOSIS — O219 Vomiting of pregnancy, unspecified: Secondary | ICD-10-CM

## 2017-09-27 DIAGNOSIS — O9989 Other specified diseases and conditions complicating pregnancy, childbirth and the puerperium: Secondary | ICD-10-CM

## 2017-09-27 DIAGNOSIS — Z283 Underimmunization status: Secondary | ICD-10-CM

## 2017-09-27 DIAGNOSIS — B9689 Other specified bacterial agents as the cause of diseases classified elsewhere: Secondary | ICD-10-CM

## 2017-09-27 DIAGNOSIS — E559 Vitamin D deficiency, unspecified: Secondary | ICD-10-CM

## 2017-09-27 MED ORDER — DOXYLAMINE-PYRIDOXINE 10-10 MG PO TBEC: DELAYED_RELEASE_TABLET | ORAL | 4 refills | Status: DC

## 2017-09-27 MED ORDER — ONDANSETRON 8 MG PO TBDP: 8.0000 mg | ORAL_TABLET | Freq: Three times a day (TID) | ORAL | 0 refills | Status: DC | PRN

## 2017-09-27 MED ORDER — MOXIFLOXACIN HCL 0.5 % OP SOLN: 1.0000 [drp] | Freq: Three times a day (TID) | OPHTHALMIC | 0 refills | Status: DC

## 2017-09-27 NOTE — Patient Instructions (Signed)
Bacterial Conjunctivitis Bacterial conjunctivitis is an infection of your conjunctiva. This is the clear membrane that covers the white part of your eye and the inner surface of your eyelid. This condition can make your eye:  Red or pink.  Itchy.  This condition is caused by bacteria. This condition spreads very easily from person to person (is contagious) and from one eye to the other eye. Follow these instructions at home: Medicines  Take or apply your antibiotic medicine as told by your doctor. Do not stop taking or applying the antibiotic even if you start to feel better.  Take or apply over-the-counter and prescription medicines only as told by your doctor.  Do not touch your eyelid with the eye drop bottle or the ointment tube. Managing discomfort  Wipe any fluid from your eye with a warm, wet washcloth or a cotton ball.  Place a cool, clean washcloth on your eye. Do this for 10-20 minutes, 3-4 times per day. General instructions  Do not wear contact lenses until the irritation is gone. Wear glasses until your doctor says it is okay to wear contacts.  Do not wear eye makeup until your symptoms are gone. Throw away any old makeup.  Change or wash your pillowcase every day.  Do not share towels or washcloths with anyone.  Wash your hands often with soap and water. Use paper towels to dry your hands.  Do not touch or rub your eyes.  Do not drive or use heavy machinery if your vision is blurry. Contact a doctor if:  You have a fever.  Your symptoms do not get better after 10 days. Get help right away if:  You have a fever and your symptoms suddenly get worse.  You have very bad pain when you move your eye.  Your face: ? Hurts. ? Is red. ? Is swollen.  You have sudden loss of vision. This information is not intended to replace advice given to you by your health care provider. Make sure you discuss any questions you have with your health care provider. Document  Released: 01/20/2008 Document Revised: 09/18/2015 Document Reviewed: 01/23/2015 Elsevier Interactive Patient Education  2018 [employer]  [employer] CorporationBraxton Hicks Contractions Contractions of the uterus can occur throughout pregnancy, but they are not always a sign that you are in labor. You may have practice contractions called Braxton Hicks contractions. These false labor contractions are sometimes confused with true labor. What are Cynthia PeltonBraxton Hicks contractions? Braxton Hicks contractions are tightening movements that occur in the muscles of the uterus before labor. Unlike true labor contractions, these contractions do not result in opening (dilation) and thinning of the cervix. Toward the end of pregnancy (32-34 weeks), Braxton Hicks contractions can happen more often and may become stronger. These contractions are sometimes difficult to tell apart from true labor because they can be very uncomfortable. You should not feel embarrassed if you go to the hospital with false labor. Sometimes, the only way to tell if you are in true labor is for your health care provider to look for changes in the cervix. The health care provider will do a physical exam and may monitor your contractions. If you are not in true labor, the exam should show that your cervix is not dilating and your water has not broken. If there are other health problems associated with your pregnancy, it is completely safe for you to be sent home with false labor. You may continue to have Braxton Hicks contractions until you go into true  labor. How to tell the difference between true labor and false labor True labor  Contractions last 30-70 seconds.  Contractions become very regular.  Discomfort is usually felt in the top of the uterus, and it spreads to the lower abdomen and low back.  Contractions do not go away with walking.  Contractions usually become more intense and increase in frequency.  The cervix dilates and gets thinner. False  labor  Contractions are usually shorter and not as strong as true labor contractions.  Contractions are usually irregular.  Contractions are often felt in the front of the lower abdomen and in the groin.  Contractions may go away when you walk around or change positions while lying down.  Contractions get weaker and are shorter-lasting as time goes on.  The cervix usually does not dilate or become thin. Follow these instructions at home:  Take over-the-counter and prescription medicines only as told by your health care provider.  Keep up with your usual exercises and follow other instructions from your health care provider.  Eat and drink lightly if you think you are going into labor.  If Braxton Hicks contractions are making you uncomfortable: ? Change your position from lying down or resting to walking, or change from walking to resting. ? Sit and rest in a tub of warm water. ? Drink enough fluid to keep your urine pale yellow. Dehydration may cause these contractions. ? Do slow and deep breathing several times an hour.  Keep all follow-up prenatal visits as told by your health care provider. This is important. Contact a health care provider if:  You have a fever.  You have continuous pain in your abdomen. Get help right away if:  Your contractions become stronger, more regular, and closer together.  You have fluid leaking or gushing from your vagina.  You pass blood-tinged mucus (bloody show).  You have bleeding from your vagina.  You have low back pain that you never had before.  You feel your baby's head pushing down and causing pelvic pressure.  Your baby is not moving inside you as much as it used to. Summary  Contractions that occur before labor are called Braxton Hicks contractions, false labor, or practice contractions.  Braxton Hicks contractions are usually shorter, weaker, farther apart, and less regular than true labor contractions. True labor  contractions usually become progressively stronger and regular and they become more frequent.  Manage discomfort from [facility] contractions by changing position, resting in a warm bath, drinking plenty of water, or practicing deep breathing. This information is not intended to replace advice given to you by your health care provider. Make sure you discuss any questions you have with your health care provider. Document Released: 08/26/2016 Document Revised: 08/26/2016 Document Reviewed: 08/26/2016 Elsevier Interactive Patient Education  2018 [employer].  Morning Sickness Morning sickness is when you feel sick to your stomach (nauseous) during pregnancy. You may feel sick to your stomach and throw up (vomit). You may feel sick in the morning, but you can feel this way any time of day. Some women feel very sick to their stomach and cannot stop throwing up (hyperemesis gravidarum). Follow these instructions at home:  Only take medicines as told by your doctor.  Take multivitamins as told by your doctor. Taking multivitamins before getting pregnant can stop or lessen the harshness of morning sickness.  Eat dry toast or unsalted crackers before getting out of bed.  Eat 5 to 6 small meals a day.  Eat dry  and bland foods like rice and baked potatoes.  Do not drink liquids with meals. Drink between meals.  Do not eat greasy, fatty, or spicy foods.  Have someone cook for you if the smell of food causes you to feel sick or throw up.  If you feel sick to your stomach after taking prenatal vitamins, take them at night or with a snack.  Eat protein when you need a snack (nuts, yogurt, cheese).  Eat unsweetened gelatins for dessert.  Wear a bracelet used for sea sickness (acupressure wristband).  Go to a doctor that puts thin needles into certain body points (acupuncture) to improve how you feel.  Do not smoke.  Use a humidifier to keep the air in your house free of odors.  Get lots  of fresh air. Contact a doctor if:  You need medicine to feel better.  You feel dizzy or lightheaded.  You are losing weight. Get help right away if:  You feel very sick to your stomach and cannot stop throwing up.  You pass out (faint). This information is not intended to replace advice given to you by your health care provider. Make sure you discuss any questions you have with your health care provider. Document Released: 05/20/2004 Document Revised: 09/18/2015 Document Reviewed: 09/27/2012 Elsevier Interactive Patient Education  2017 [employer].  Second Trimester of Pregnancy The second trimester is from week 14 through week 27 (months 4 through 6). The second trimester is often a time when you feel your best. Your body has adjusted to being pregnant, and you begin to feel better physically. Usually, morning sickness has lessened or quit completely, you may have more energy, and you may have an increase in appetite. The second trimester is also a time when the fetus is growing rapidly. At the end of the sixth month, the fetus is about 9 inches long and weighs about 1 pounds. You will likely begin to feel the baby move (quickening) between 16 and 20 weeks of pregnancy. Body changes during your second trimester Your body continues to go through many changes during your second trimester. The changes vary from woman to woman.  Your weight will continue to increase. You will notice your lower abdomen bulging out.  You may begin to get stretch marks on your hips, abdomen, and breasts.  You may develop headaches that can be relieved by medicines. The medicines should be approved by your health care provider.  You may urinate more often because the fetus is pressing on your bladder.  You may develop or continue to have heartburn as a result of your pregnancy.  You may develop constipation because certain hormones are causing the muscles that push waste through your intestines to slow  down.  You may develop hemorrhoids or swollen, bulging veins (varicose veins).  You may have back pain. This is caused by: ? Weight gain. ? Pregnancy hormones that are relaxing the joints in your pelvis. ? A shift in weight and the muscles that support your balance.  Your breasts will continue to grow and they will continue to become tender.  Your gums may bleed and may be sensitive to brushing and flossing.  Dark spots or blotches (chloasma, mask of pregnancy) may develop on your face. This will likely fade after the baby is born.  A dark line from your belly button to the pubic area (linea nigra) may appear. This will likely fade after the baby is born.  You may have changes in your hair. These  can include thickening of your hair, rapid growth, and changes in texture. Some women also have hair loss during or after pregnancy, or hair that feels dry or thin. Your hair will most likely return to normal after your baby is born.  What to expect at prenatal visits During a routine prenatal visit:  You will be weighed to make sure you and the fetus are growing normally.  Your blood pressure will be taken.  Your abdomen will be measured to track your baby's growth.  The fetal heartbeat will be listened to.  Any test results from the previous visit will be discussed.  Your health care provider may ask you:  How you are feeling.  If you are feeling the baby move.  If you have had any abnormal symptoms, such as leaking fluid, bleeding, severe headaches, or abdominal cramping.  If you are using any tobacco products, including cigarettes, chewing tobacco, and electronic cigarettes.  If you have any questions.  Other tests that may be performed during your second trimester include:  Blood tests that check for: ? Low iron levels (anemia). ? High blood sugar that affects pregnant women (gestational diabetes) between 37 and 28 weeks. ? Rh antibodies. This is to check for a protein  on red blood cells (Rh factor).  Urine tests to check for infections, diabetes, or protein in the urine.  An ultrasound to confirm the proper growth and development of the baby.  An amniocentesis to check for possible genetic problems.  Fetal screens for spina bifida and Down syndrome.  HIV (human immunodeficiency virus) testing. Routine prenatal testing includes screening for HIV, unless you choose not to have this test.  Follow these instructions at home: Medicines  Follow your health care provider's instructions regarding medicine use. Specific medicines may be either safe or unsafe to take during pregnancy.  Take a prenatal vitamin that contains at least 600 micrograms (mcg) of folic acid.  If you develop constipation, try taking a stool softener if your health care provider approves. Eating and drinking  Eat a balanced diet that includes fresh fruits and vegetables, whole grains, good sources of protein such as meat, eggs, or tofu, and low-fat dairy. Your health care provider will help you determine the amount of weight gain that is right for you.  Avoid raw meat and uncooked cheese. These carry germs that can cause birth defects in the baby.  If you have low calcium intake from food, talk to your health care provider about whether you should take a daily calcium supplement.  Limit foods that are high in fat and processed sugars, such as fried and sweet foods.  To prevent constipation: ? Drink enough fluid to keep your urine clear or pale yellow. ? Eat foods that are high in fiber, such as fresh fruits and vegetables, whole grains, and beans. Activity  Exercise only as directed by your health care provider. Most women can continue their usual exercise routine during pregnancy. Try to exercise for 30 minutes at least 5 days a week. Stop exercising if you experience uterine contractions.  Avoid heavy lifting, wear low heel shoes, and practice good posture.  A sexual  relationship may be continued unless your health care provider directs you otherwise. Relieving pain and discomfort  Wear a good support bra to prevent discomfort from breast tenderness.  Take warm sitz baths to soothe any pain or discomfort caused by hemorrhoids. Use hemorrhoid cream if your health care provider approves.  Rest with your legs elevated if  you have leg cramps or low back pain.  If you develop varicose veins, wear support hose. Elevate your feet for 15 minutes, 3-4 times a day. Limit salt in your diet. Prenatal Care  Write down your questions. Take them to your prenatal visits.  Keep all your prenatal visits as told by your health care provider. This is important. Safety  Wear your seat belt at all times when driving.  Make a list of emergency phone numbers, including numbers for family, friends, the hospital, and police and fire departments. General instructions  Ask your health care provider for a referral to a local prenatal education class. Begin classes no later than the beginning of month 6 of your pregnancy.  Ask for help if you have counseling or nutritional needs during pregnancy. Your health care provider can offer advice or refer you to specialists for help with various needs.  Do not use hot tubs, steam rooms, or saunas.  Do not douche or use tampons or scented sanitary pads.  Do not cross your legs for long periods of time.  Avoid cat litter boxes and soil used by cats. These carry germs that can cause birth defects in the baby and possibly loss of the fetus by miscarriage or stillbirth.  Avoid all smoking, herbs, alcohol, and unprescribed drugs. Chemicals in these products can affect the formation and growth of the baby.  Do not use any products that contain nicotine or tobacco, such as cigarettes and e-cigarettes. If you need help quitting, ask your health care provider.  Visit your dentist if you have not gone yet during your pregnancy. Use a soft  toothbrush to brush your teeth and be gentle when you floss. Contact a health care provider if:  You have dizziness.  You have mild pelvic cramps, pelvic pressure, or nagging pain in the abdominal area.  You have persistent nausea, vomiting, or diarrhea.  You have a bad smelling vaginal discharge.  You have pain when you urinate. Get help right away if:  You have a fever.  You are leaking fluid from your vagina.  You have spotting or bleeding from your vagina.  You have severe abdominal cramping or pain.  You have rapid weight gain or weight loss.  You have shortness of breath with chest pain.  You notice sudden or extreme swelling of your face, hands, ankles, feet, or legs.  You have not felt your baby move in over an hour.  You have severe headaches that do not go away when you take medicine.  You have vision changes. Summary  The second trimester is from week 14 through week 27 (months 4 through 6). It is also a time when the fetus is growing rapidly.  Your body goes through many changes during pregnancy. The changes vary from woman to woman.  Avoid all smoking, herbs, alcohol, and unprescribed drugs. These chemicals affect the formation and growth your baby.  Do not use any tobacco products, such as cigarettes, chewing tobacco, and e-cigarettes. If you need help quitting, ask your health care provider.  Contact your health care provider if you have any questions. Keep all prenatal visits as told by your health care provider. This is important. This information is not intended to replace advice given to you by your health care provider. Make sure you discuss any questions you have with your health care provider. Document Released: 04/06/2001 Document Revised: 05/18/2016 Document Reviewed: 05/18/2016 Elsevier Interactive Patient Education  [employer].
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
## 2017-10-04 ENCOUNTER — Inpatient Hospital Stay (HOSPITAL_COMMUNITY)
Admission: EM | Admit: 2017-10-04 | Discharge: 2017-10-04 | Disposition: A | Discharge status: Home / Self Care | Payer: Medicaid Other | Source: Ambulatory Visit | Attending: Obstetrics and Gynecology | Admitting: Obstetrics and Gynecology

## 2017-10-04 ENCOUNTER — Encounter (HOSPITAL_COMMUNITY): Payer: Self-pay | Admitting: *Deleted

## 2017-10-04 DIAGNOSIS — O26899 Other specified pregnancy related conditions, unspecified trimester: Secondary | ICD-10-CM

## 2017-10-04 DIAGNOSIS — R102 Pelvic and perineal pain: Secondary | ICD-10-CM | POA: Diagnosis not present

## 2017-10-04 DIAGNOSIS — O26893 Other specified pregnancy related conditions, third trimester: Secondary | ICD-10-CM | POA: Diagnosis not present

## 2017-10-04 DIAGNOSIS — M25559 Pain in unspecified hip: Secondary | ICD-10-CM | POA: Diagnosis present

## 2017-10-04 DIAGNOSIS — Z3A25 25 weeks gestation of pregnancy: Secondary | ICD-10-CM | POA: Insufficient documentation

## 2017-10-04 DIAGNOSIS — O26892 Other specified pregnancy related conditions, second trimester: Secondary | ICD-10-CM | POA: Diagnosis not present

## 2017-10-04 LAB — WET PREP, GENITAL
CLUE CELLS WET PREP: NONE SEEN
SPERM: NONE SEEN
TRICH WET PREP: NONE SEEN
Yeast Wet Prep HPF POC: NONE SEEN

## 2017-10-04 LAB — URINALYSIS, ROUTINE W REFLEX MICROSCOPIC
Bilirubin Urine: NEGATIVE
Glucose, UA: NEGATIVE mg/dL
Hgb urine dipstick: NEGATIVE
Ketones, ur: NEGATIVE mg/dL
Leukocytes, UA: NEGATIVE
NITRITE: NEGATIVE
PH: 6 (ref 5.0–8.0)
Protein, ur: NEGATIVE mg/dL
SPECIFIC GRAVITY, URINE: 1.015 (ref 1.005–1.030)

## 2017-10-04 MED ORDER — M.V.I. ADULT IV INJ
Freq: Once | INTRAVENOUS | Status: DC
  Filled 2017-10-04: qty 10
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
## 2017-10-04 NOTE — MAU Note (Signed)
Pt unable to void @ this time
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
## 2017-10-04 NOTE — MAU Note (Signed)
Pt C/O sharp lower abd pain around C/S scar, also nauseated & dizzy, under a lot of stress for the last 3 weeks.  Denies bleeding or LOF.  Fetal movement has been decreased at times.

## 2017-10-04 NOTE — Discharge Instructions (Signed)

## 2017-10-06 ENCOUNTER — Ambulatory Visit (HOSPITAL_COMMUNITY)
Admission: RE | Admit: 2017-10-06 | Discharge: 2017-10-06 | Disposition: A | Discharge status: Home / Self Care | Payer: Medicaid Other | Source: Ambulatory Visit | Attending: Certified Nurse Midwife | Admitting: Certified Nurse Midwife

## 2017-10-06 ENCOUNTER — Other Ambulatory Visit: Payer: Self-pay | Admitting: Certified Nurse Midwife

## 2017-10-06 DIAGNOSIS — Z362 Encounter for other antenatal screening follow-up: Secondary | ICD-10-CM | POA: Diagnosis not present

## 2017-10-06 DIAGNOSIS — Z3480 Encounter for supervision of other normal pregnancy, unspecified trimester: Secondary | ICD-10-CM

## 2017-10-06 DIAGNOSIS — Z3A25 25 weeks gestation of pregnancy: Secondary | ICD-10-CM

## 2017-10-06 DIAGNOSIS — O99212 Obesity complicating pregnancy, second trimester: Secondary | ICD-10-CM

## 2017-10-06 DIAGNOSIS — Z3482 Encounter for supervision of other normal pregnancy, second trimester: Secondary | ICD-10-CM | POA: Diagnosis present

## 2017-10-06 LAB — GC/CHLAMYDIA PROBE AMP (~~LOC~~) NOT AT ARMC
CHLAMYDIA, DNA PROBE: NEGATIVE
NEISSERIA GONORRHEA: NEGATIVE

## 2017-10-11 ENCOUNTER — Other Ambulatory Visit: Payer: Self-pay | Admitting: Certified Nurse Midwife

## 2017-10-11 DIAGNOSIS — Z3480 Encounter for supervision of other normal pregnancy, unspecified trimester: Secondary | ICD-10-CM

## 2017-10-16 IMAGING — US US MFM OB COMP +14 WKS
1 series · 14 of 28 positions shown · non-contrast
Comparison: none

[Series 1: us mfm ob comp +14 wks · 14 of 61 slices shown]
[im 3/61]
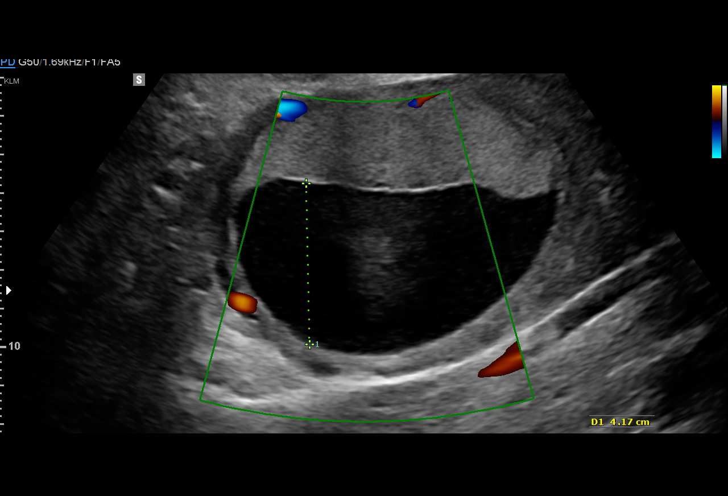
[im 7/61]
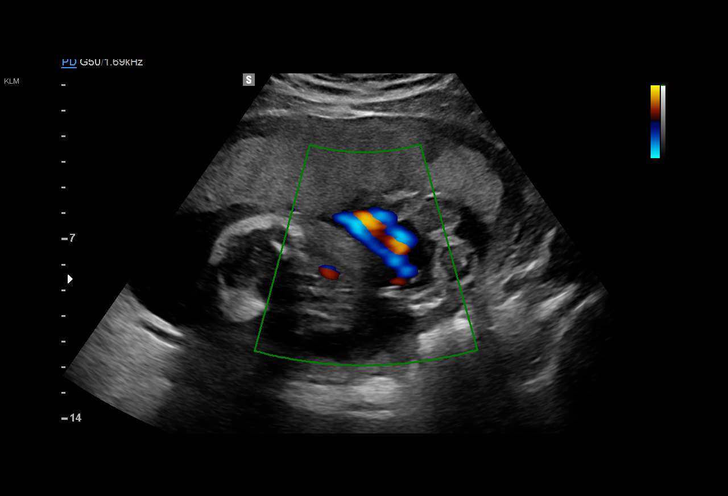
[im 12/61]
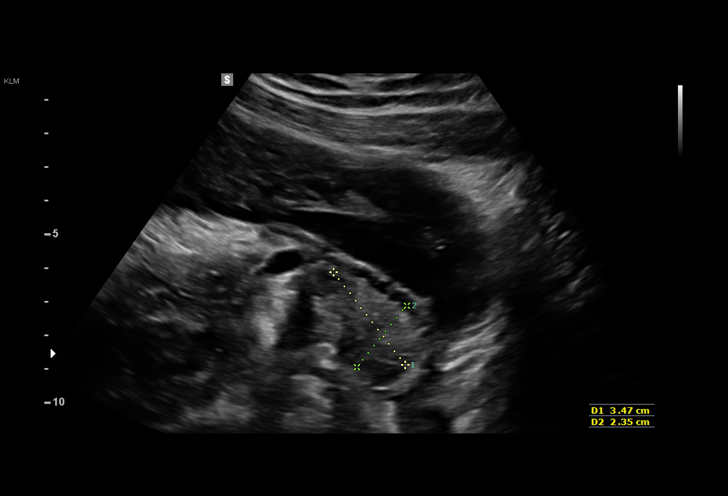
[im 16/61]
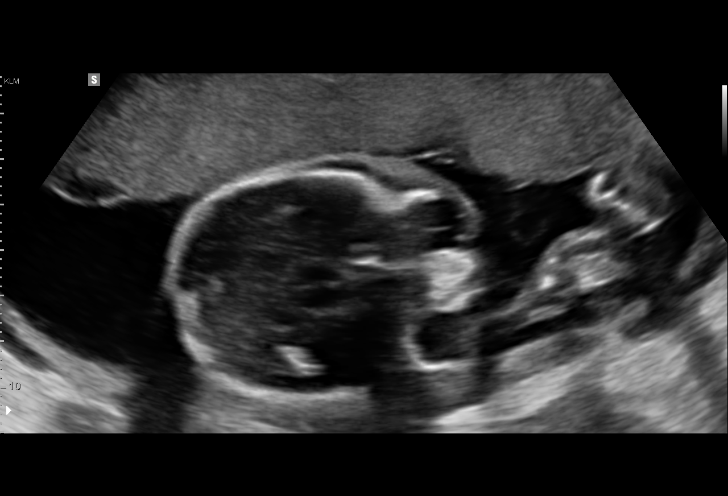
[im 21/61]
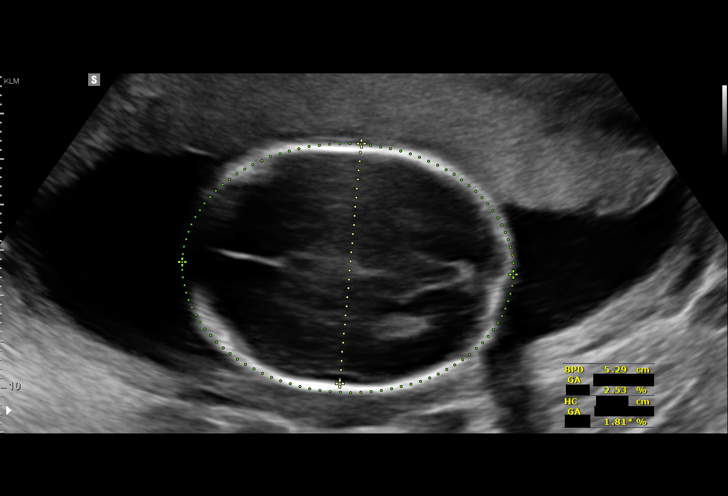
[im 25/61]
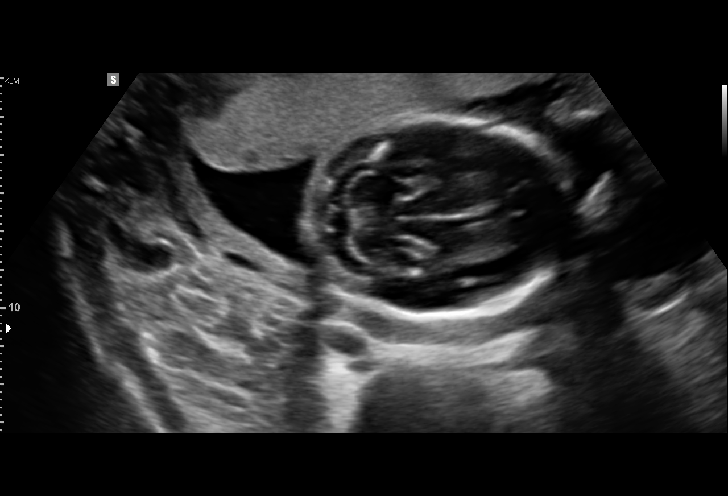
[im 29/61]
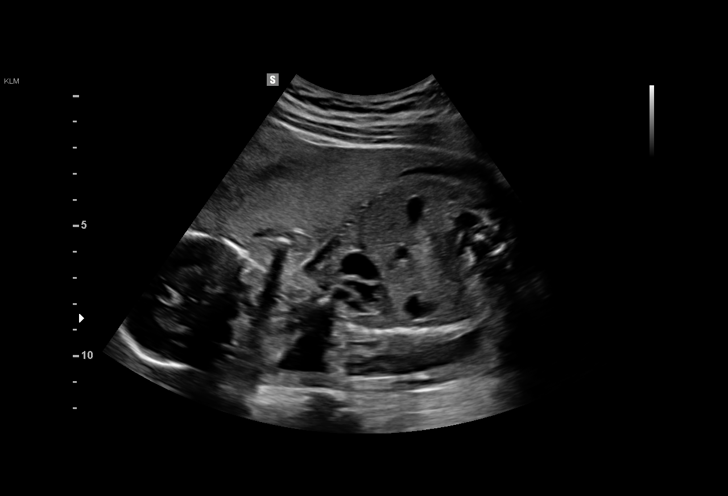
[im 34/61]
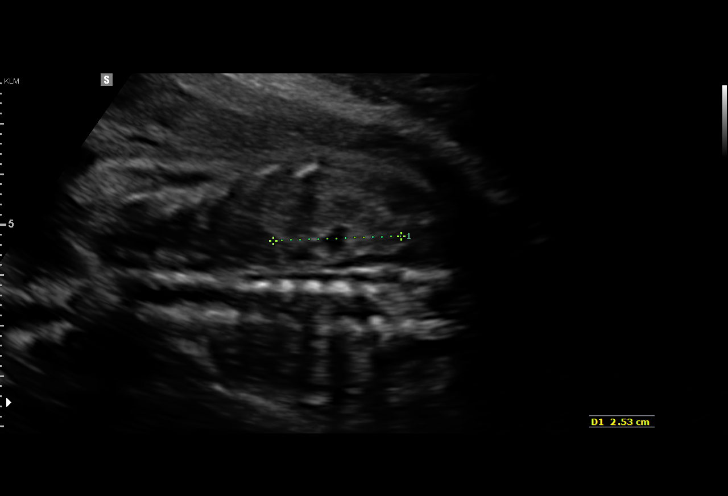
[im 38/61]
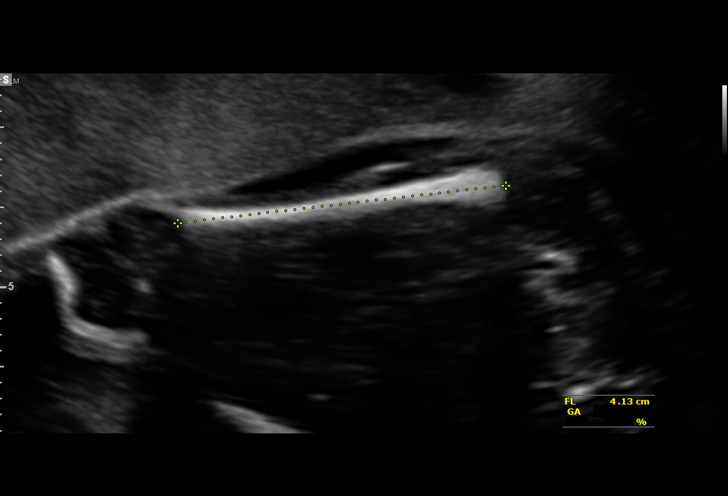
[im 43/61]
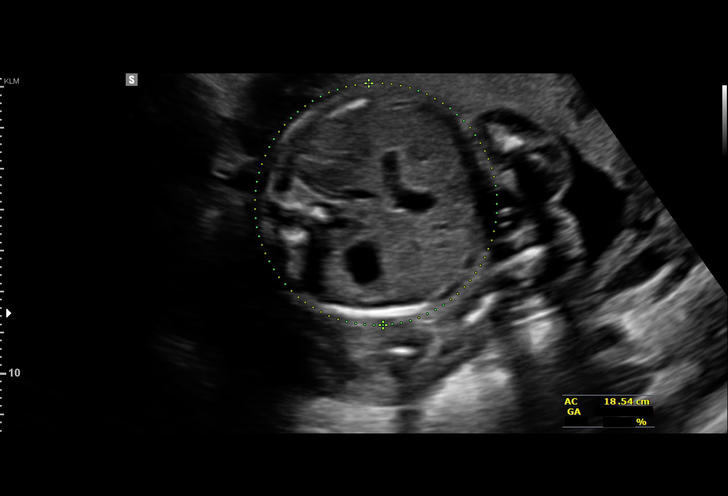
[im 47/61]
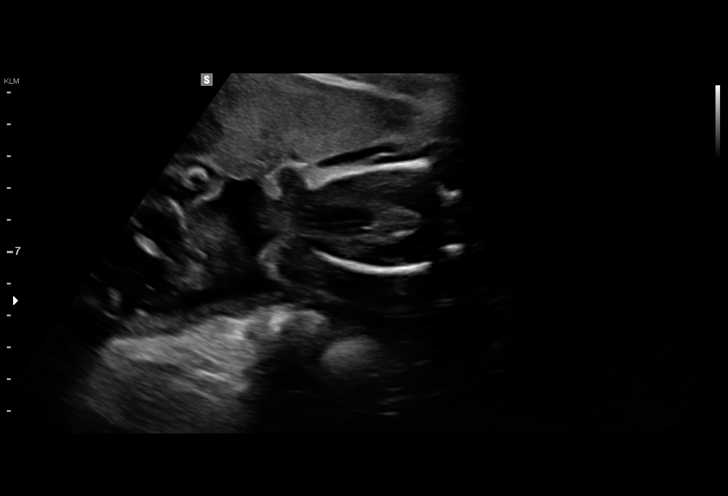
[im 52/61]
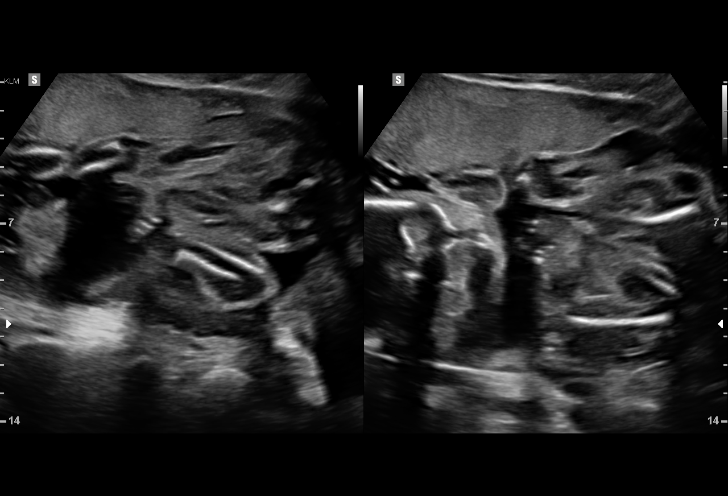
[im 56/61]
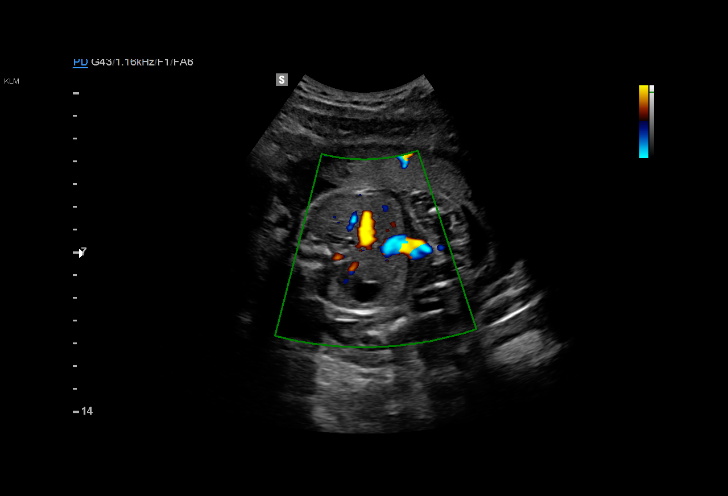
[im 61/61]
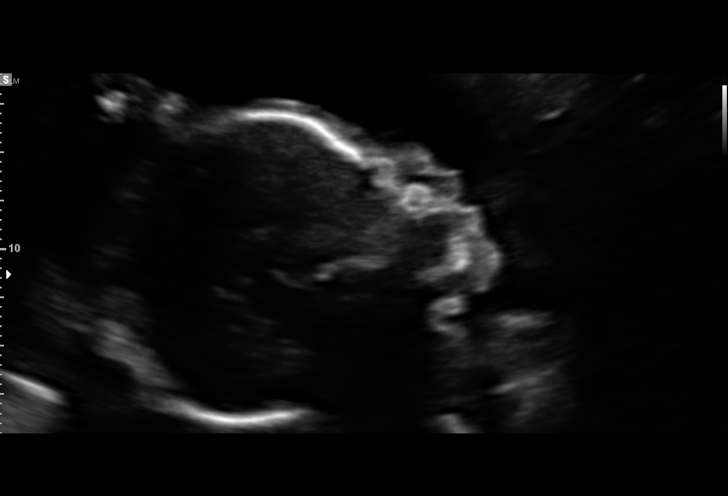

[14 of 28 positions shown; findings below may reference images not displayed]

1  TAT TIGER          [id number]      [id number]     [id number]
Indications

23 weeks gestation of pregnancy
Basic anatomic survey                          Z36
Other mental disorder complicating
pregnancy, second trimester (depression,
bipolar disorder)
OB History

Gravidity:    1         Term:   0        Prem:   0        SAB:   0
TOP:          0       Ectopic:  0        Living: 0
Fetal Evaluation

Num Of Fetuses:     1
Fetal Heart         145
Rate(bpm):
Cardiac Activity:   Observed
Presentation:       Breech
Placenta:           Anterior, above cervical os
P. Cord Insertion:  Visualized

Amniotic Fluid
AFI FV:      Subjectively within normal limits
Biometry

BPD:      53.5  mm     G. Age:  22w 2d         10  %    CI:        71.85   %   70 - 86
FL/HC:      20.3   %   19.2 -
HC:      200.9  mm     G. Age:  22w 2d          5  %    HC/AC:      1.07       1.05 -
AC:      187.3  mm     G. Age:  23w 4d         44  %    FL/BPD:     76.1   %   71 - 87
FL:       40.7  mm     G. Age:  23w 1d         31  %    FL/AC:      21.7   %   20 - 24
HUM:      36.7  mm     G. Age:  22w 6d         29  %
Est. FW:     572  gm      1 lb 4 oz     49  %
Gestational Age

U/S Today:     22w 6d                                        EDD:   03/30/16
Best:          23w 3d    Det. By:   Early Ultrasound         EDD:   03/26/16
(08/11/15)
Anatomy

Cranium:               Appears normal         Aortic Arch:            Not well visualized
Cavum:                 Appears normal         Ductal Arch:            Not well visualized
Ventricles:            Appears normal         Diaphragm:              Appears normal
Choroid Plexus:        Appears normal         Stomach:                Appears normal, left
sided
Cerebellum:            Appears normal         Abdomen:                Appears normal
Posterior Fossa:       Appears normal         Abdominal Wall:         Not well visualized
Nuchal Fold:           Not applicable (>20    Cord Vessels:           Appears normal (3
wks GA)                                        vessel cord)
Face:                  Orbits nl; profile not Kidneys:                Appear normal
well visualized
Lips:                  Appears normal         Bladder:                Appears normal
Thoracic:              Appears normal         Spine:                  Not well visualized
Heart:                 Appears normal         Upper Extremities:      Appears normal
(4CH, axis, and situs
RVOT:                  Appears normal         Lower Extremities:      Appears normal
LVOT:                  Appears normal

Other:  Fetus appears to be a male. Heels visualized. Technically difficult due
to fetal position.
Cervix Uterus Adnexa

Cervix
Length:            4.2  cm.
Normal appearance by transabdominal scan.

Uterus
No abnormality visualized.

Left Ovary
Within normal limits.

Right Ovary
Within normal limits.

Cul De Sac:   No free fluid seen.

Adnexa:       No abnormality visualized.
Impression

SIUP at 23+3 weeks
Normal detailed fetal anatomy; limited views of profile,
arches, CI and spine
Normal amniotic fluid volume
Measurements consistent with prior US; EFW at the 49th
%tile
Recommendations

Follow-up ultrasound in 4-6 weeks to complete anatomy
survey

## 2017-10-17 ENCOUNTER — Ambulatory Visit (INDEPENDENT_AMBULATORY_CARE_PROVIDER_SITE_OTHER): Payer: Medicaid Other | Admitting: Certified Nurse Midwife

## 2017-10-17 ENCOUNTER — Encounter: Payer: Self-pay | Admitting: Certified Nurse Midwife

## 2017-10-17 ENCOUNTER — Other Ambulatory Visit (HOSPITAL_COMMUNITY)
Admission: RE | Admit: 2017-10-17 | Discharge: 2017-10-17 | Disposition: A | Discharge status: Home / Self Care | Payer: Medicaid Other | Source: Ambulatory Visit | Attending: Certified Nurse Midwife | Admitting: Certified Nurse Midwife

## 2017-10-17 VITALS — BP 134/78 | HR 99 | Wt 222.0 lb

## 2017-10-17 DIAGNOSIS — Z3480 Encounter for supervision of other normal pregnancy, unspecified trimester: Secondary | ICD-10-CM

## 2017-10-17 DIAGNOSIS — N760 Acute vaginitis: Secondary | ICD-10-CM

## 2017-10-17 DIAGNOSIS — A5901 Trichomonal vulvovaginitis: Secondary | ICD-10-CM | POA: Insufficient documentation

## 2017-10-17 DIAGNOSIS — O26892 Other specified pregnancy related conditions, second trimester: Secondary | ICD-10-CM | POA: Diagnosis present

## 2017-10-17 DIAGNOSIS — L0293 Carbuncle, unspecified: Secondary | ICD-10-CM

## 2017-10-17 DIAGNOSIS — Z3482 Encounter for supervision of other normal pregnancy, second trimester: Secondary | ICD-10-CM

## 2017-10-17 DIAGNOSIS — B9689 Other specified bacterial agents as the cause of diseases classified elsewhere: Secondary | ICD-10-CM

## 2017-10-17 DIAGNOSIS — B373 Candidiasis of vulva and vagina: Secondary | ICD-10-CM

## 2017-10-17 DIAGNOSIS — N898 Other specified noninflammatory disorders of vagina: Secondary | ICD-10-CM

## 2017-10-17 DIAGNOSIS — O23592 Infection of other part of genital tract in pregnancy, second trimester: Secondary | ICD-10-CM | POA: Diagnosis not present

## 2017-10-17 DIAGNOSIS — B3731 Acute candidiasis of vulva and vagina: Secondary | ICD-10-CM

## 2017-10-17 MED ORDER — FLUCONAZOLE 150 MG PO TABS: 150.0000 mg | ORAL_TABLET | Freq: Once | ORAL | 0 refills | Status: AC

## 2017-10-17 MED ORDER — SECNIDAZOLE 2 G PO PACK
1.0000 | PACK | Freq: Once | ORAL | 0 refills | Status: AC
Start: 2017-10-17 — End: 2017-10-17

## 2017-10-17 MED ORDER — TERCONAZOLE 0.8 % VA CREA: 1.0000 | TOPICAL_CREAM | Freq: Every day | VAGINAL | 0 refills | Status: DC
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
## 2017-10-17 NOTE — Progress Notes (Signed)
ROB.  C/o malodorous, yellow, green discharge x 3 weeks. Lower abdominal pain 8/10, rash, boil on left inner thigh and numbness in both arm.  She wants referral to a Dermatologist.

## 2017-10-18 ENCOUNTER — Telehealth: Payer: Self-pay

## 2017-10-18 LAB — CERVICOVAGINAL ANCILLARY ONLY
Bacterial vaginitis: NEGATIVE
CANDIDA VAGINITIS: POSITIVE — AB
Chlamydia: NEGATIVE
Neisseria Gonorrhea: NEGATIVE
Trichomonas: POSITIVE — AB

## 2017-10-18 MED ORDER — CLINDAMYCIN PHOSPHATE 100 MG VA SUPP: 100.0000 mg | Freq: Every day | VAGINAL | 0 refills | Status: DC

## 2017-10-18 NOTE — Telephone Encounter (Signed)
Returned call and pt stated that after taking solosec she vomited blood and throat is itching.  Per provider, advised pt to take benadryl and sent rx for vag suppository.

## 2017-10-19 ENCOUNTER — Other Ambulatory Visit: Payer: Self-pay | Admitting: Certified Nurse Midwife

## 2017-10-19 DIAGNOSIS — A5901 Trichomonal vulvovaginitis: Secondary | ICD-10-CM

## 2017-10-19 DIAGNOSIS — B373 Candidiasis of vulva and vagina: Secondary | ICD-10-CM

## 2017-10-19 DIAGNOSIS — B3731 Acute candidiasis of vulva and vagina: Secondary | ICD-10-CM

## 2017-10-19 DIAGNOSIS — O23592 Infection of other part of genital tract in pregnancy, second trimester: Principal | ICD-10-CM

## 2017-10-19 MED ORDER — FLUCONAZOLE 150 MG PO TABS: 150.0000 mg | ORAL_TABLET | Freq: Once | ORAL | 0 refills | Status: AC

## 2017-10-19 MED ORDER — TINIDAZOLE 500 MG PO TABS: 2.0000 g | ORAL_TABLET | Freq: Every day | ORAL | 0 refills | Status: DC

## 2017-10-19 MED ORDER — SECNIDAZOLE 2 G PO PACK: 1.0000 | PACK | Freq: Once | ORAL | 0 refills | Status: AC

## 2017-10-21 ENCOUNTER — Telehealth: Payer: Self-pay

## 2017-10-21 NOTE — Telephone Encounter (Signed)
Attempted to contact about additional results, no answer, mailbox full.

## 2017-10-25 ENCOUNTER — Ambulatory Visit (INDEPENDENT_AMBULATORY_CARE_PROVIDER_SITE_OTHER): Payer: Medicaid Other | Admitting: Certified Nurse Midwife

## 2017-10-25 ENCOUNTER — Encounter: Payer: Self-pay | Admitting: Certified Nurse Midwife

## 2017-10-25 ENCOUNTER — Other Ambulatory Visit: Payer: Medicaid Other

## 2017-10-25 VITALS — BP 116/75 | HR 79 | Wt 223.0 lb

## 2017-10-25 DIAGNOSIS — Z23 Encounter for immunization: Secondary | ICD-10-CM

## 2017-10-25 DIAGNOSIS — O99613 Diseases of the digestive system complicating pregnancy, third trimester: Secondary | ICD-10-CM

## 2017-10-25 DIAGNOSIS — O99619 Diseases of the digestive system complicating pregnancy, unspecified trimester: Secondary | ICD-10-CM

## 2017-10-25 DIAGNOSIS — E559 Vitamin D deficiency, unspecified: Secondary | ICD-10-CM

## 2017-10-25 DIAGNOSIS — K219 Gastro-esophageal reflux disease without esophagitis: Secondary | ICD-10-CM

## 2017-10-25 DIAGNOSIS — O26893 Other specified pregnancy related conditions, third trimester: Secondary | ICD-10-CM

## 2017-10-25 DIAGNOSIS — R51 Headache: Secondary | ICD-10-CM

## 2017-10-25 DIAGNOSIS — Z3490 Encounter for supervision of normal pregnancy, unspecified, unspecified trimester: Secondary | ICD-10-CM

## 2017-10-25 DIAGNOSIS — Z283 Underimmunization status: Secondary | ICD-10-CM

## 2017-10-25 DIAGNOSIS — Z3483 Encounter for supervision of other normal pregnancy, third trimester: Secondary | ICD-10-CM

## 2017-10-25 DIAGNOSIS — R519 Headache, unspecified: Secondary | ICD-10-CM

## 2017-10-25 DIAGNOSIS — O9989 Other specified diseases and conditions complicating pregnancy, childbirth and the puerperium: Secondary | ICD-10-CM

## 2017-10-25 DIAGNOSIS — O09899 Supervision of other high risk pregnancies, unspecified trimester: Secondary | ICD-10-CM

## 2017-10-25 MED ORDER — OMEPRAZOLE 20 MG PO CPDR: 20.0000 mg | DELAYED_RELEASE_CAPSULE | Freq: Two times a day (BID) | ORAL | 5 refills | Status: DC

## 2017-10-25 MED ORDER — BUTALBITAL-APAP-CAFFEINE 50-325-40 MG PO TABS: 1.0000 | ORAL_TABLET | Freq: Four times a day (QID) | ORAL | 4 refills | Status: DC | PRN
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
## 2017-10-25 NOTE — Progress Notes (Signed)
Pt states she has had a persistent cough x 2 wks and HA's.and visual changes (floaters)     Tdap given

## 2017-10-26 LAB — CBC
HEMATOCRIT: 39 % (ref 34.0–46.6)
Hemoglobin: 12.7 g/dL (ref 11.1–15.9)
MCH: 29.3 pg (ref 26.6–33.0)
MCHC: 32.6 g/dL (ref 31.5–35.7)
MCV: 90 fL (ref 79–97)
Platelets: 239 10*3/uL (ref 150–450)
RBC: 4.33 x10E6/uL (ref 3.77–5.28)
RDW: 13.6 % (ref 12.3–15.4)
WBC: 10 10*3/uL (ref 3.4–10.8)

## 2017-10-26 LAB — HIV ANTIBODY (ROUTINE TESTING W REFLEX): HIV SCREEN 4TH GENERATION: NONREACTIVE

## 2017-10-26 LAB — GLUCOSE TOLERANCE, 2 HOURS W/ 1HR
GLUCOSE, 1 HOUR: 170 mg/dL (ref 65–179)
GLUCOSE, 2 HOUR: 164 mg/dL — AB (ref 65–152)
Glucose, Fasting: 97 mg/dL — ABNORMAL HIGH (ref 65–91)

## 2017-10-26 LAB — RPR: RPR Ser Ql: NONREACTIVE

## 2017-11-02 ENCOUNTER — Other Ambulatory Visit: Payer: Self-pay | Admitting: Certified Nurse Midwife

## 2017-11-02 DIAGNOSIS — O24419 Gestational diabetes mellitus in pregnancy, unspecified control: Secondary | ICD-10-CM | POA: Insufficient documentation

## 2017-11-03 ENCOUNTER — Telehealth: Payer: Self-pay

## 2017-11-03 ENCOUNTER — Other Ambulatory Visit: Payer: Self-pay

## 2017-11-03 MED ORDER — ACCU-CHEK AVIVA PLUS W/DEVICE KIT
1.0000 | PACK | Freq: Four times a day (QID) | 0 refills | Status: DC
Start: 2017-11-03 — End: 2018-01-12

## 2017-11-03 MED ORDER — GLUCOSE BLOOD VI STRP: ORAL_STRIP | 12 refills | Status: DC

## 2017-11-03 MED ORDER — ACCU-CHEK FASTCLIX LANCETS MISC: 1.0000 | Freq: Four times a day (QID) | 12 refills | Status: DC

## 2017-11-03 NOTE — Telephone Encounter (Signed)
Diabetic supplies ordered. Patient was notified of results, Rx and MFM referral.   She verbalized understanding.

## 2017-11-03 NOTE — Progress Notes (Signed)
Diabetic supplies ordered per R. Marjo Bickerenney

## 2017-11-03 NOTE — Telephone Encounter (Signed)
-----   Message from Roe Coombsachelle A Denney, CNM sent at 11/02/2017  9:35 PM EDT ----- Please let her know that she did not pass her glucose testing.  Please order her glucometer, test strips and lancets.  MFM referral orders have been placed.  Thank you.  R.Denney CNM Needs her next appointment with FP in the office.

## 2017-11-07 ENCOUNTER — Ambulatory Visit (INDEPENDENT_AMBULATORY_CARE_PROVIDER_SITE_OTHER): Payer: Medicaid Other | Admitting: Obstetrics and Gynecology

## 2017-11-07 ENCOUNTER — Encounter (HOSPITAL_COMMUNITY): Payer: Self-pay

## 2017-11-07 ENCOUNTER — Inpatient Hospital Stay (HOSPITAL_COMMUNITY)
Admission: AD | Admit: 2017-11-07 | Discharge: 2017-11-07 | Disposition: A | Discharge status: Home / Self Care | Payer: Medicaid Other | Source: Ambulatory Visit | Attending: Obstetrics and Gynecology | Admitting: Obstetrics and Gynecology

## 2017-11-07 ENCOUNTER — Other Ambulatory Visit: Payer: Self-pay

## 2017-11-07 ENCOUNTER — Encounter: Payer: Self-pay | Admitting: Obstetrics and Gynecology

## 2017-11-07 VITALS — BP 128/84 | HR 96 | Wt 225.0 lb

## 2017-11-07 DIAGNOSIS — O2340 Unspecified infection of urinary tract in pregnancy, unspecified trimester: Secondary | ICD-10-CM

## 2017-11-07 DIAGNOSIS — Z3689 Encounter for other specified antenatal screening: Secondary | ICD-10-CM | POA: Insufficient documentation

## 2017-11-07 DIAGNOSIS — Z885 Allergy status to narcotic agent status: Secondary | ICD-10-CM | POA: Insufficient documentation

## 2017-11-07 DIAGNOSIS — O99343 Other mental disorders complicating pregnancy, third trimester: Secondary | ICD-10-CM

## 2017-11-07 DIAGNOSIS — R200 Anesthesia of skin: Secondary | ICD-10-CM | POA: Insufficient documentation

## 2017-11-07 DIAGNOSIS — R1010 Upper abdominal pain, unspecified: Secondary | ICD-10-CM | POA: Insufficient documentation

## 2017-11-07 DIAGNOSIS — Z880 Allergy status to penicillin: Secondary | ICD-10-CM | POA: Diagnosis not present

## 2017-11-07 DIAGNOSIS — O0993 Supervision of high risk pregnancy, unspecified, third trimester: Secondary | ICD-10-CM

## 2017-11-07 DIAGNOSIS — F419 Anxiety disorder, unspecified: Secondary | ICD-10-CM | POA: Diagnosis not present

## 2017-11-07 DIAGNOSIS — N390 Urinary tract infection, site not specified: Secondary | ICD-10-CM | POA: Diagnosis not present

## 2017-11-07 DIAGNOSIS — O24419 Gestational diabetes mellitus in pregnancy, unspecified control: Secondary | ICD-10-CM

## 2017-11-07 DIAGNOSIS — O0990 Supervision of high risk pregnancy, unspecified, unspecified trimester: Secondary | ICD-10-CM

## 2017-11-07 DIAGNOSIS — R51 Headache: Secondary | ICD-10-CM | POA: Insufficient documentation

## 2017-11-07 DIAGNOSIS — K219 Gastro-esophageal reflux disease without esophagitis: Secondary | ICD-10-CM | POA: Insufficient documentation

## 2017-11-07 DIAGNOSIS — Z79899 Other long term (current) drug therapy: Secondary | ICD-10-CM | POA: Diagnosis not present

## 2017-11-07 DIAGNOSIS — Z91040 Latex allergy status: Secondary | ICD-10-CM | POA: Insufficient documentation

## 2017-11-07 DIAGNOSIS — Z8249 Family history of ischemic heart disease and other diseases of the circulatory system: Secondary | ICD-10-CM | POA: Diagnosis not present

## 2017-11-07 LAB — URINALYSIS, ROUTINE W REFLEX MICROSCOPIC
BILIRUBIN URINE: NEGATIVE
Glucose, UA: NEGATIVE mg/dL
Hgb urine dipstick: NEGATIVE
KETONES UR: NEGATIVE mg/dL
Nitrite: NEGATIVE
PH: 6 (ref 5.0–8.0)
PROTEIN: 30 mg/dL — AB
Specific Gravity, Urine: 1.027 (ref 1.005–1.030)

## 2017-11-07 LAB — GLUCOSE, POCT (MANUAL RESULT ENTRY): POC Glucose: 111 mg/dl — AB (ref 70–99)

## 2017-11-07 MED ORDER — CEPHALEXIN 500 MG PO CAPS: 500.0000 mg | ORAL_CAPSULE | Freq: Four times a day (QID) | ORAL | 0 refills | Status: DC

## 2017-11-07 MED ORDER — NITROFURANTOIN MONOHYD MACRO 100 MG PO CAPS: 100.0000 mg | ORAL_CAPSULE | Freq: Two times a day (BID) | ORAL | 0 refills | Status: DC
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
## 2017-11-07 NOTE — Discharge Instructions (Signed)

## 2017-11-07 NOTE — MAU Note (Signed)
Pt presents with c/o upper left 7 right quadrant pain that began last Thursday.  Denies VB or LOF.  Reports +FM. Seen today @ [facility] for regular visit.
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
## 2017-11-08 LAB — CULTURE, OB URINE: Culture: NO GROWTH

## 2017-11-21 ENCOUNTER — Ambulatory Visit (INDEPENDENT_AMBULATORY_CARE_PROVIDER_SITE_OTHER): Payer: Medicaid Other | Admitting: Obstetrics and Gynecology

## 2017-11-21 ENCOUNTER — Encounter: Payer: Medicaid Other | Attending: Obstetrics and Gynecology | Admitting: Dietician

## 2017-11-21 ENCOUNTER — Encounter: Payer: Medicaid Other | Admitting: Obstetrics and Gynecology

## 2017-11-21 ENCOUNTER — Encounter: Payer: Self-pay | Admitting: Dietician

## 2017-11-21 VITALS — BP 123/78 | HR 93 | Wt 229.8 lb

## 2017-11-21 DIAGNOSIS — Z2839 Other underimmunization status: Secondary | ICD-10-CM

## 2017-11-21 DIAGNOSIS — Z3A00 Weeks of gestation of pregnancy not specified: Secondary | ICD-10-CM | POA: Diagnosis not present

## 2017-11-21 DIAGNOSIS — O24419 Gestational diabetes mellitus in pregnancy, unspecified control: Secondary | ICD-10-CM | POA: Diagnosis present

## 2017-11-21 DIAGNOSIS — O0993 Supervision of high risk pregnancy, unspecified, third trimester: Secondary | ICD-10-CM

## 2017-11-21 DIAGNOSIS — O9989 Other specified diseases and conditions complicating pregnancy, childbirth and the puerperium: Secondary | ICD-10-CM

## 2017-11-21 DIAGNOSIS — O23593 Infection of other part of genital tract in pregnancy, third trimester: Secondary | ICD-10-CM

## 2017-11-21 DIAGNOSIS — Z713 Dietary counseling and surveillance: Secondary | ICD-10-CM | POA: Insufficient documentation

## 2017-11-21 DIAGNOSIS — A5901 Trichomonal vulvovaginitis: Secondary | ICD-10-CM

## 2017-11-21 DIAGNOSIS — Z283 Underimmunization status: Secondary | ICD-10-CM

## 2017-11-21 DIAGNOSIS — O0990 Supervision of high risk pregnancy, unspecified, unspecified trimester: Secondary | ICD-10-CM

## 2017-11-21 DIAGNOSIS — Z98891 History of uterine scar from previous surgery: Secondary | ICD-10-CM

## 2017-11-21 MED ORDER — COMFORT FIT MATERNITY SUPP MED MISC
0 refills | Status: DC
Start: 2017-11-21 — End: 2018-01-12
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
## 2017-11-21 NOTE — Patient Instructions (Signed)
Eat 3 small meals and 3 small snacks daily Avoid sources of concentrated sugar such as cake, candy, cookies, and sugary drink. Drink water rather than juice Eat at home more than fast food.  (Fast food has a lot of salt and fat.) Continue to avoid added salt.

## 2017-11-22 ENCOUNTER — Ambulatory Visit: Payer: Medicaid Other

## 2017-11-24 ENCOUNTER — Telehealth: Payer: Self-pay

## 2017-11-24 NOTE — Telephone Encounter (Signed)
Returned call and advised of healthy diet for GDM.

## 2017-11-28 ENCOUNTER — Encounter (HOSPITAL_COMMUNITY): Payer: Self-pay | Admitting: *Deleted

## 2017-11-28 ENCOUNTER — Inpatient Hospital Stay (HOSPITAL_COMMUNITY)
Admission: AD | Admit: 2017-11-28 | Discharge: 2017-11-28 | Disposition: A | Discharge status: Home / Self Care | Payer: Medicaid Other | Source: Ambulatory Visit | Attending: Obstetrics and Gynecology | Admitting: Obstetrics and Gynecology

## 2017-11-28 DIAGNOSIS — Z809 Family history of malignant neoplasm, unspecified: Secondary | ICD-10-CM | POA: Insufficient documentation

## 2017-11-28 DIAGNOSIS — Z8249 Family history of ischemic heart disease and other diseases of the circulatory system: Secondary | ICD-10-CM | POA: Diagnosis not present

## 2017-11-28 DIAGNOSIS — Z91040 Latex allergy status: Secondary | ICD-10-CM | POA: Diagnosis not present

## 2017-11-28 DIAGNOSIS — O99613 Diseases of the digestive system complicating pregnancy, third trimester: Secondary | ICD-10-CM | POA: Insufficient documentation

## 2017-11-28 DIAGNOSIS — O26893 Other specified pregnancy related conditions, third trimester: Secondary | ICD-10-CM | POA: Diagnosis not present

## 2017-11-28 DIAGNOSIS — Z885 Allergy status to narcotic agent status: Secondary | ICD-10-CM | POA: Insufficient documentation

## 2017-11-28 DIAGNOSIS — Z880 Allergy status to penicillin: Secondary | ICD-10-CM | POA: Insufficient documentation

## 2017-11-28 DIAGNOSIS — Z79899 Other long term (current) drug therapy: Secondary | ICD-10-CM | POA: Diagnosis not present

## 2017-11-28 DIAGNOSIS — M549 Dorsalgia, unspecified: Secondary | ICD-10-CM | POA: Diagnosis present

## 2017-11-28 DIAGNOSIS — O0990 Supervision of high risk pregnancy, unspecified, unspecified trimester: Secondary | ICD-10-CM

## 2017-11-28 DIAGNOSIS — Z9889 Other specified postprocedural states: Secondary | ICD-10-CM | POA: Insufficient documentation

## 2017-11-28 DIAGNOSIS — Z3A33 33 weeks gestation of pregnancy: Secondary | ICD-10-CM | POA: Diagnosis not present

## 2017-11-28 DIAGNOSIS — R102 Pelvic and perineal pain: Secondary | ICD-10-CM | POA: Diagnosis not present

## 2017-11-28 DIAGNOSIS — Z792 Long term (current) use of antibiotics: Secondary | ICD-10-CM | POA: Insufficient documentation

## 2017-11-28 LAB — URINALYSIS, ROUTINE W REFLEX MICROSCOPIC
Bilirubin Urine: NEGATIVE
Glucose, UA: NEGATIVE mg/dL
Hgb urine dipstick: NEGATIVE
Ketones, ur: NEGATIVE mg/dL
Leukocytes, UA: NEGATIVE
Nitrite: NEGATIVE
Protein, ur: NEGATIVE mg/dL
Specific Gravity, Urine: 1.017 (ref 1.005–1.030)
pH: 6 (ref 5.0–8.0)

## 2017-11-28 LAB — WET PREP, GENITAL
Clue Cells Wet Prep HPF POC: NONE SEEN
Sperm: NONE SEEN
Trich, Wet Prep: NONE SEEN
Yeast Wet Prep HPF POC: NONE SEEN

## 2017-11-28 LAB — GLUCOSE, CAPILLARY: GLUCOSE-CAPILLARY: 95 mg/dL (ref 70–99)

## 2017-11-28 MED ORDER — CYCLOBENZAPRINE HCL 10 MG PO TABS: 10.0000 mg | ORAL_TABLET | Freq: Two times a day (BID) | ORAL | 0 refills | Status: DC | PRN

## 2017-11-28 MED ORDER — CYCLOBENZAPRINE HCL 10 MG PO TABS
10.0000 mg | ORAL_TABLET | Freq: Once | ORAL | Status: AC
  Administered 2017-11-28: 10 mg via ORAL
  Filled 2017-11-28: qty 1

## 2017-11-28 NOTE — MAU Note (Signed)
Pt presents to mau with c/o pelvic/back pain and stomach pain since over the weekend.  Denies n/v/d.  Has been having problems with constipation.  Recent dx of gestational diabetes diet controlled. States sugars have been off track. Supposed to take blood sugar 4x per day.  But has not taken blood sugar at all today.

## 2017-11-28 NOTE — MAU Note (Signed)
Urine in lab 
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
## 2017-11-29 LAB — GC/CHLAMYDIA PROBE AMP (~~LOC~~) NOT AT ARMC
Chlamydia: NEGATIVE
Neisseria Gonorrhea: POSITIVE — AB

## 2017-11-30 ENCOUNTER — Encounter: Payer: Self-pay | Admitting: Certified Nurse Midwife

## 2017-11-30 ENCOUNTER — Telehealth: Payer: Self-pay

## 2017-11-30 DIAGNOSIS — O98213 Gonorrhea complicating pregnancy, third trimester: Secondary | ICD-10-CM | POA: Insufficient documentation

## 2017-11-30 NOTE — Telephone Encounter (Signed)
-----   Message from Sharyon CableVeronica C Rogers, CNM sent at 11/30/2017 10:27 AM EDT ----- Angelique Blonderenise Little-Faison tested positive for Gonorrhea when seen in MAU. [facility] and plans to transfer on 8/12 to [facility] office.  Please call patient to schedule appointment for treatment in the office at prenatal appointment or prior to appointment.   Thank you!   Sharyon CableRogers, Veronica C, CNM 11/30/2017 10:21 AM

## 2017-11-30 NOTE — Telephone Encounter (Signed)
Called pt about positive Gonorrhea test results. Got message stating the subscriber is unavailable because phone is not in service.

## 2017-12-05 ENCOUNTER — Encounter: Payer: Self-pay | Admitting: Obstetrics & Gynecology

## 2017-12-05 ENCOUNTER — Encounter: Payer: Medicaid Other | Admitting: Family Medicine

## 2017-12-05 ENCOUNTER — Ambulatory Visit (INDEPENDENT_AMBULATORY_CARE_PROVIDER_SITE_OTHER): Payer: Medicaid Other | Admitting: Obstetrics & Gynecology

## 2017-12-05 VITALS — BP 110/66 | HR 93 | Wt 233.1 lb

## 2017-12-05 DIAGNOSIS — A549 Gonococcal infection, unspecified: Secondary | ICD-10-CM | POA: Diagnosis not present

## 2017-12-05 DIAGNOSIS — O98213 Gonorrhea complicating pregnancy, third trimester: Secondary | ICD-10-CM

## 2017-12-05 DIAGNOSIS — O24419 Gestational diabetes mellitus in pregnancy, unspecified control: Secondary | ICD-10-CM

## 2017-12-05 DIAGNOSIS — Z2839 Other underimmunization status: Secondary | ICD-10-CM

## 2017-12-05 DIAGNOSIS — O9989 Other specified diseases and conditions complicating pregnancy, childbirth and the puerperium: Secondary | ICD-10-CM

## 2017-12-05 DIAGNOSIS — O99212 Obesity complicating pregnancy, second trimester: Secondary | ICD-10-CM

## 2017-12-05 DIAGNOSIS — Z98891 History of uterine scar from previous surgery: Secondary | ICD-10-CM

## 2017-12-05 DIAGNOSIS — Z283 Underimmunization status: Secondary | ICD-10-CM

## 2017-12-05 DIAGNOSIS — O0990 Supervision of high risk pregnancy, unspecified, unspecified trimester: Secondary | ICD-10-CM

## 2017-12-05 MED ORDER — AZITHROMYCIN 250 MG PO TABS: 1000.0000 mg | ORAL_TABLET | Freq: Once | ORAL | 0 refills | Status: AC

## 2017-12-05 MED ORDER — CEFTRIAXONE SODIUM 250 MG IJ SOLR
250.0000 mg | Freq: Once | INTRAMUSCULAR | Status: AC
  Administered 2017-12-05: 250 mg via INTRAMUSCULAR

## 2017-12-05 NOTE — Patient Instructions (Signed)
Gestational Diabetes Mellitus, Diagnosis Gestational diabetes (gestational diabetes mellitus) is a short-term (temporary) form of diabetes that can happen during pregnancy. It goes away after you give birth. It may be caused by one or both of these problems:  Your body does not make enough of a hormone called insulin.  Your body does not respond in a normal way to insulin that it makes.  Insulin lets sugars (glucose) go into cells in the body. This gives you energy. If you have diabetes, sugars cannot get into cells. This causes high blood sugar (hyperglycemia). If diabetes is treated, it may not hurt you or your baby. Your doctor will set treatment goals for you. In general, you should have these blood sugar levels:  After not eating for a long time (fasting): 95 mg/dL (5.3 mmol/L).  After meals (postprandial): ? One hour after a meal: at or below 140 mg/dL (7.8 mmol/L). ? Two hours after a meal: at or below 120 mg/dL (6.7 mmol/L).  A1c (hemoglobin A1c) level: 6-6.5%.  Follow these instructions at home: Questions to Ask Your Doctor  You may want to ask these questions:  Do I need to meet with a diabetes educator?  Where can I find a support group for people with diabetes?  What equipment will I need to care for myself at home?  What diabetes medicines do I need? When should I take them?  How often do I need to check my blood sugar?  What number can I call if I have questions?  When is my next doctor's visit?  General instructions  Take over-the-counter and prescription medicines only as told by your doctor.  Stay at a healthy weight during pregnancy.  Keep all follow-up visits as told by your doctor. This is important. Contact a doctor if:  Your blood sugar is at or above 240 mg/dL (16.113.3 mmol/L).  Your blood sugar is at or above 200 mg/dL (09.611.1 mmol/L) and you have ketones in your pee (urine).  You have been sick or have had a fever for 2 days or more and you are  not getting better.  You have any of these problems for more than 6 hours: ? You cannot eat or drink. ? You feel sick to your stomach (nauseous). ? You throw up (vomit). ? You have watery poop (diarrhea). Get help right away if:  Your blood sugar is lower than 54 mg/dL (3 mmol/L).  You get confused.  You have trouble: ? Thinking clearly. ? Breathing.  Your baby moves less than normal.  You have: ? Moderate or large ketone levels in your pee (urine). ? Bleeding from your vagina. ? Unusual fluid coming from your vagina. ? Early contractions. These may feel like tightness in your belly. This information is not intended to replace advice given to you by your health care provider. Make sure you discuss any questions you have with your health care provider. Document Released: 08/04/2015 Document Revised: 09/18/2015 Document Reviewed: 05/16/2015 Elsevier Interactive Patient Education  2018 [employer] Blood Glucose Monitoring, Adult Monitoring your blood sugar (glucose) helps you manage your diabetes. It also helps you and your health care provider determine how well your diabetes management plan is working. Blood glucose monitoring involves checking your blood glucose as often as directed, and keeping a record (log) of your results over time. Why should I monitor my blood glucose? Checking your blood glucose regularly can:  Help you understand how food, exercise, illnesses, and medicines affect your blood glucose.  Let you  know what your blood glucose is at any time. You can quickly tell if you are having low blood glucose (hypoglycemia) or high blood glucose (hyperglycemia).  Help you and your health care provider adjust your medicines as needed.  When should I check my blood glucose? Follow instructions from your health care provider about how often to check your blood glucose. This may depend on:  The type of diabetes you have.  How well-controlled your diabetes  is.  Medicines you are taking.  If you have type 1 diabetes:  Check your blood glucose at least 2 times a day.  Also check your blood glucose: ? Before every insulin injection. ? Before and after exercise. ? Between meals. ? 2 hours after a meal. ? Occasionally between 2:00 a.m. and 3:00 a.m., as directed. ? Before potentially dangerous tasks, like driving or using heavy machinery. ? At bedtime.  You may need to check your blood glucose more often, up to 6-10 times a day: ? If you use an insulin pump. ? If you need multiple daily injections (MDI). ? If your diabetes is not well-controlled. ? If you are ill. ? If you have a history of severe hypoglycemia. ? If you have a history of not knowing when your blood glucose is getting low (hypoglycemia unawareness). If you have type 2 diabetes:  If you take insulin or other diabetes medicines, check your blood glucose at least 2 times a day.  If you are on intensive insulin therapy, check your blood glucose at least 4 times a day. Occasionally, you may also need to check between 2:00 a.m. and 3:00 a.m., as directed.  Also check your blood glucose: ? Before and after exercise. ? Before potentially dangerous tasks, like driving or using heavy machinery.  You may need to check your blood glucose more often if: ? Your medicine is being adjusted. ? Your diabetes is not well-controlled. ? You are ill. What is a blood glucose log?  A blood glucose log is a record of your blood glucose readings. It helps you and your health care provider: ? Look for patterns in your blood glucose over time. ? Adjust your diabetes management plan as needed.  Every time you check your blood glucose, write down your result and notes about things that may be affecting your blood glucose, such as your diet and exercise for the day.  Most glucose meters store a record of glucose readings in the meter. Some meters allow you to download your records to a  computer. How do I check my blood glucose? Follow these steps to get accurate readings of your blood glucose: Supplies needed   Blood glucose meter.  Test strips for your meter. Each meter has its own strips. You must use the strips that come with your meter.  A needle to prick your finger (lancet). Do not use lancets more than once.  A device that holds the lancet (lancing device).  A journal or log book to write down your results. Procedure  Wash your hands with soap and water.  Prick the side of your finger (not the tip) with the lancet. Use a different finger each time.  Gently rub the finger until a small drop of blood appears.  Follow instructions that come with your meter for inserting the test strip, applying blood to the strip, and using your blood glucose meter.  Write down your result and any notes. Alternative testing sites  Some meters allow you to use areas of your  body other than your finger (alternative sites) to test your blood.  If you think you may have hypoglycemia, or if you have hypoglycemia unawareness, do not use alternative sites. Use your finger instead.  Alternative sites may not be as accurate as the fingers, because blood flow is slower in these areas. This means that the result you get may be delayed, and it may be different from the result that you would get from your finger.  The most common alternative sites are: ? Forearm. ? Thigh. ? Palm of the hand. Additional tips  Always keep your supplies with you.  If you have questions or need help, all blood glucose meters have a 24-hour "hotline" number that you can call. You may also contact your health care provider.  After you use a few boxes of test strips, adjust (calibrate) your blood glucose meter by following instructions that came with your meter. This information is not intended to replace advice given to you by your health care provider. Make sure you discuss any questions you have  with your health care provider. Document Released: 04/15/2003 Document Revised: 10/31/2015 Document Reviewed: 09/22/2015 Elsevier Interactive Patient Education  2017 [employer].

## 2017-12-05 NOTE — Telephone Encounter (Signed)
Pt was given injection in office for Gonorrhea. Zithromax was sent to pharmacy.
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
## 2017-12-05 NOTE — Progress Notes (Signed)
Pt has Gestational Diabetes.

## 2017-12-07 ENCOUNTER — Encounter: Payer: Medicaid Other | Admitting: Obstetrics & Gynecology

## 2017-12-14 ENCOUNTER — Ambulatory Visit (INDEPENDENT_AMBULATORY_CARE_PROVIDER_SITE_OTHER): Payer: Medicaid Other | Admitting: Obstetrics & Gynecology

## 2017-12-14 VITALS — BP 114/73 | HR 94 | Wt 235.1 lb

## 2017-12-14 DIAGNOSIS — Z98891 History of uterine scar from previous surgery: Secondary | ICD-10-CM

## 2017-12-14 DIAGNOSIS — O9989 Other specified diseases and conditions complicating pregnancy, childbirth and the puerperium: Secondary | ICD-10-CM

## 2017-12-14 DIAGNOSIS — F4310 Post-traumatic stress disorder, unspecified: Secondary | ICD-10-CM

## 2017-12-14 DIAGNOSIS — Z87898 Personal history of other specified conditions: Secondary | ICD-10-CM

## 2017-12-14 DIAGNOSIS — O98213 Gonorrhea complicating pregnancy, third trimester: Secondary | ICD-10-CM

## 2017-12-14 DIAGNOSIS — O0990 Supervision of high risk pregnancy, unspecified, unspecified trimester: Secondary | ICD-10-CM

## 2017-12-14 DIAGNOSIS — F1291 Cannabis use, unspecified, in remission: Secondary | ICD-10-CM

## 2017-12-14 DIAGNOSIS — O99213 Obesity complicating pregnancy, third trimester: Secondary | ICD-10-CM

## 2017-12-14 DIAGNOSIS — O09899 Supervision of other high risk pregnancies, unspecified trimester: Secondary | ICD-10-CM

## 2017-12-14 DIAGNOSIS — O0993 Supervision of high risk pregnancy, unspecified, third trimester: Secondary | ICD-10-CM

## 2017-12-14 DIAGNOSIS — O24419 Gestational diabetes mellitus in pregnancy, unspecified control: Secondary | ICD-10-CM

## 2017-12-14 DIAGNOSIS — O99212 Obesity complicating pregnancy, second trimester: Secondary | ICD-10-CM

## 2017-12-14 DIAGNOSIS — Z283 Underimmunization status: Secondary | ICD-10-CM
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
## 2017-12-15 ENCOUNTER — Telehealth: Payer: Self-pay

## 2017-12-15 LAB — GLUCOSE, RANDOM: GLUCOSE: 151 mg/dL — AB (ref 65–99)

## 2017-12-15 NOTE — Telephone Encounter (Signed)
Patient called requesting her lab result from yesterday. Told patient her blood sugar was 151 mg/d Patient asked if this was high. I responded yes.   She states that she was eating in the room before we took her blood glucose. I asked patient what food she consumed before the blood draw and she said a  [employer] bacon double cheeseburger, fries, and nuggets.  Patient states what she should do from her. I made Dr. Erin Fullingharraway smith aware patient was asking for plan of care. Dr. Burnice Loganharraway- smith responded that patient needs to check her blood sugars four times a day and keep a log and bring them with her to next appointment.  Patient given above recommendations and states "she aint taking this serious" Patient made aware that for us to see the over all picture and she a trend of her blood sugars she needs to be checking them four times a day and keep a log. Patient made aware we are taking this seriously and are concerned for her and her baby.  Patient states ok and hangs up. Armandina StammerJennifer Daneya Hartgrove RN

## 2017-12-16 ENCOUNTER — Other Ambulatory Visit: Payer: Self-pay

## 2017-12-16 DIAGNOSIS — O24419 Gestational diabetes mellitus in pregnancy, unspecified control: Secondary | ICD-10-CM

## 2017-12-21 ENCOUNTER — Encounter (HOSPITAL_COMMUNITY): Payer: Self-pay | Admitting: *Deleted

## 2017-12-21 ENCOUNTER — Other Ambulatory Visit: Payer: Self-pay

## 2017-12-21 ENCOUNTER — Inpatient Hospital Stay (HOSPITAL_COMMUNITY)
Admission: AD | Admit: 2017-12-21 | Discharge: 2017-12-21 | Disposition: A | Discharge status: Home / Self Care | Payer: Medicaid Other | Source: Ambulatory Visit | Attending: Obstetrics and Gynecology | Admitting: Obstetrics and Gynecology

## 2017-12-21 DIAGNOSIS — F909 Attention-deficit hyperactivity disorder, unspecified type: Secondary | ICD-10-CM | POA: Insufficient documentation

## 2017-12-21 DIAGNOSIS — R109 Unspecified abdominal pain: Secondary | ICD-10-CM | POA: Diagnosis present

## 2017-12-21 DIAGNOSIS — Z87891 Personal history of nicotine dependence: Secondary | ICD-10-CM | POA: Diagnosis not present

## 2017-12-21 DIAGNOSIS — O99353 Diseases of the nervous system complicating pregnancy, third trimester: Secondary | ICD-10-CM | POA: Diagnosis not present

## 2017-12-21 DIAGNOSIS — F329 Major depressive disorder, single episode, unspecified: Secondary | ICD-10-CM | POA: Diagnosis not present

## 2017-12-21 DIAGNOSIS — K219 Gastro-esophageal reflux disease without esophagitis: Secondary | ICD-10-CM | POA: Diagnosis not present

## 2017-12-21 DIAGNOSIS — O99343 Other mental disorders complicating pregnancy, third trimester: Secondary | ICD-10-CM | POA: Diagnosis not present

## 2017-12-21 DIAGNOSIS — F4310 Post-traumatic stress disorder, unspecified: Secondary | ICD-10-CM | POA: Insufficient documentation

## 2017-12-21 DIAGNOSIS — M549 Dorsalgia, unspecified: Secondary | ICD-10-CM | POA: Diagnosis present

## 2017-12-21 DIAGNOSIS — R110 Nausea: Secondary | ICD-10-CM | POA: Diagnosis present

## 2017-12-21 DIAGNOSIS — O4703 False labor before 37 completed weeks of gestation, third trimester: Secondary | ICD-10-CM | POA: Diagnosis not present

## 2017-12-21 DIAGNOSIS — F319 Bipolar disorder, unspecified: Secondary | ICD-10-CM | POA: Diagnosis not present

## 2017-12-21 DIAGNOSIS — O99613 Diseases of the digestive system complicating pregnancy, third trimester: Secondary | ICD-10-CM | POA: Diagnosis not present

## 2017-12-21 DIAGNOSIS — O479 False labor, unspecified: Secondary | ICD-10-CM

## 2017-12-21 DIAGNOSIS — G4730 Sleep apnea, unspecified: Secondary | ICD-10-CM | POA: Insufficient documentation

## 2017-12-21 DIAGNOSIS — Z3A36 36 weeks gestation of pregnancy: Secondary | ICD-10-CM | POA: Insufficient documentation

## 2017-12-21 LAB — URINALYSIS, ROUTINE W REFLEX MICROSCOPIC
Bilirubin Urine: NEGATIVE
GLUCOSE, UA: NEGATIVE mg/dL
Hgb urine dipstick: NEGATIVE
Ketones, ur: NEGATIVE mg/dL
LEUKOCYTES UA: NEGATIVE
NITRITE: NEGATIVE
Protein, ur: NEGATIVE mg/dL
Specific Gravity, Urine: 1.016 (ref 1.005–1.030)
pH: 6 (ref 5.0–8.0)

## 2017-12-21 MED ORDER — CYCLOBENZAPRINE HCL 10 MG PO TABS
10.0000 mg | ORAL_TABLET | Freq: Once | ORAL | Status: AC
  Administered 2017-12-21: 10 mg via ORAL
  Filled 2017-12-21: qty 1

## 2017-12-21 MED ORDER — CYCLOBENZAPRINE HCL 10 MG PO TABS: 10.0000 mg | ORAL_TABLET | Freq: Two times a day (BID) | ORAL | 0 refills | Status: DC | PRN

## 2017-12-21 NOTE — MAU Note (Addendum)
Was just having pain, low back and lower abd.  Her back is killing her. Is nauseous. Feeling a little wetness

## 2017-12-21 NOTE — Discharge Instructions (Signed)
Braxton Hicks Contractions °Contractions of the uterus can occur throughout pregnancy, but they are not always a sign that you are in labor. You may have practice contractions called Braxton Hicks contractions. These false labor contractions are sometimes confused with true labor. °What are Braxton Hicks contractions? °Braxton Hicks contractions are tightening movements that occur in the muscles of the uterus before labor. Unlike true labor contractions, these contractions do not result in opening (dilation) and thinning of the cervix. Toward the end of pregnancy (32-34 weeks), Braxton Hicks contractions can happen more often and may become stronger. These contractions are sometimes difficult to tell apart from true labor because they can be very uncomfortable. You should not feel embarrassed if you go to the hospital with false labor. °Sometimes, the only way to tell if you are in true labor is for your health care provider to look for changes in the cervix. The health care provider will do a physical exam and may monitor your contractions. If you are not in true labor, the exam should show that your cervix is not dilating and your water has not broken. °If there are other health problems associated with your pregnancy, it is completely safe for you to be sent home with false labor. You may continue to have Braxton Hicks contractions until you go into true labor. °How to tell the difference between true labor and false labor °True labor °· Contractions last 30-70 seconds. °· Contractions become very regular. °· Discomfort is usually felt in the top of the uterus, and it spreads to the lower abdomen and low back. °· Contractions do not go away with walking. °· Contractions usually become more intense and increase in frequency. °· The cervix dilates and gets thinner. °False labor °· Contractions are usually shorter and not as strong as true labor contractions. °· Contractions are usually irregular. °· Contractions  are often felt in the front of the lower abdomen and in the groin. °· Contractions may go away when you walk around or change positions while lying down. °· Contractions get weaker and are shorter-lasting as time goes on. °· The cervix usually does not dilate or become thin. °Follow these instructions at home: °· Take over-the-counter and prescription medicines only as told by your health care provider. °· Keep up with your usual exercises and follow other instructions from your health care provider. °· Eat and drink lightly if you think you are going into labor. °· If Braxton Hicks contractions are making you uncomfortable: °? Change your position from lying down or resting to walking, or change from walking to resting. °? Sit and rest in a tub of warm water. °? Drink enough fluid to keep your urine pale yellow. Dehydration may cause these contractions. °? Do slow and deep breathing several times an hour. °· Keep all follow-up prenatal visits as told by your health care provider. This is important. °Contact a health care provider if: °· You have a fever. °· You have continuous pain in your abdomen. °Get help right away if: °· Your contractions become stronger, more regular, and closer together. °· You have fluid leaking or gushing from your vagina. °· You pass blood-tinged mucus (bloody show). °· You have bleeding from your vagina. °· You have low back pain that you never had before. °· You feel your baby’s head pushing down and causing pelvic pressure. °· Your baby is not moving inside you as much as it used to. °Summary °· Contractions that occur before labor are called Braxton   Hicks contractions, false labor, or practice contractions. °· Braxton Hicks contractions are usually shorter, weaker, farther apart, and less regular than true labor contractions. True labor contractions usually become progressively stronger and regular and they become more frequent. °· Manage discomfort from Braxton Hicks contractions by  changing position, resting in a warm bath, drinking plenty of water, or practicing deep breathing. °This information is not intended to replace advice given to you by your health care provider. Make sure you discuss any questions you have with your health care provider. °Document Released: 08/26/2016 Document Revised: 08/26/2016 Document Reviewed: 08/26/2016 °Elsevier Interactive Patient Education © 2018 [employer] ° °
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
## 2017-12-22 ENCOUNTER — Ambulatory Visit (HOSPITAL_COMMUNITY)
Admission: RE | Admit: 2017-12-22 | Discharge: 2017-12-22 | Disposition: A | Discharge status: Home / Self Care | Payer: Medicaid Other | Source: Ambulatory Visit | Attending: Obstetrics & Gynecology | Admitting: Obstetrics & Gynecology

## 2017-12-22 DIAGNOSIS — Z3A36 36 weeks gestation of pregnancy: Secondary | ICD-10-CM | POA: Diagnosis not present

## 2017-12-22 DIAGNOSIS — O34219 Maternal care for unspecified type scar from previous cesarean delivery: Secondary | ICD-10-CM | POA: Insufficient documentation

## 2017-12-22 DIAGNOSIS — O99212 Obesity complicating pregnancy, second trimester: Secondary | ICD-10-CM | POA: Diagnosis not present

## 2017-12-22 DIAGNOSIS — Z362 Encounter for other antenatal screening follow-up: Secondary | ICD-10-CM | POA: Insufficient documentation

## 2017-12-22 DIAGNOSIS — O24419 Gestational diabetes mellitus in pregnancy, unspecified control: Secondary | ICD-10-CM | POA: Insufficient documentation

## 2017-12-22 DIAGNOSIS — O99213 Obesity complicating pregnancy, third trimester: Secondary | ICD-10-CM

## 2017-12-27 ENCOUNTER — Encounter: Payer: Self-pay | Admitting: *Deleted

## 2017-12-30 ENCOUNTER — Ambulatory Visit (INDEPENDENT_AMBULATORY_CARE_PROVIDER_SITE_OTHER): Payer: Medicaid Other | Admitting: Advanced Practice Midwife

## 2017-12-30 ENCOUNTER — Other Ambulatory Visit (HOSPITAL_COMMUNITY)
Admission: RE | Admit: 2017-12-30 | Discharge: 2017-12-30 | Disposition: A | Discharge status: Home / Self Care | Payer: Medicaid Other | Source: Ambulatory Visit | Attending: Advanced Practice Midwife | Admitting: Advanced Practice Midwife

## 2017-12-30 ENCOUNTER — Encounter: Payer: Self-pay | Admitting: Advanced Practice Midwife

## 2017-12-30 VITALS — BP 108/72 | HR 84 | Wt 240.1 lb

## 2017-12-30 DIAGNOSIS — O24419 Gestational diabetes mellitus in pregnancy, unspecified control: Secondary | ICD-10-CM

## 2017-12-30 DIAGNOSIS — Z98891 History of uterine scar from previous surgery: Secondary | ICD-10-CM

## 2017-12-30 DIAGNOSIS — O98213 Gonorrhea complicating pregnancy, third trimester: Secondary | ICD-10-CM | POA: Diagnosis not present

## 2017-12-30 DIAGNOSIS — Z3A37 37 weeks gestation of pregnancy: Secondary | ICD-10-CM | POA: Insufficient documentation

## 2017-12-30 DIAGNOSIS — O0990 Supervision of high risk pregnancy, unspecified, unspecified trimester: Secondary | ICD-10-CM

## 2017-12-30 DIAGNOSIS — O99213 Obesity complicating pregnancy, third trimester: Secondary | ICD-10-CM

## 2017-12-30 DIAGNOSIS — O0993 Supervision of high risk pregnancy, unspecified, third trimester: Secondary | ICD-10-CM

## 2017-12-30 LAB — OB RESULTS CONSOLE GBS: GBS: NEGATIVE

## 2017-12-30 LAB — GLUCOSE, POCT (MANUAL RESULT ENTRY): POC Glucose: NEGATIVE mg/dl (ref 70–99)

## 2017-12-30 NOTE — Progress Notes (Signed)
Pt adamantly continues to refuse to check her sugars. Pt c/o boils on breast and vaginal areas.
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
## 2018-01-02 LAB — CERVICOVAGINAL ANCILLARY ONLY
CHLAMYDIA, DNA PROBE: NEGATIVE
NEISSERIA GONORRHEA: NEGATIVE

## 2018-01-03 ENCOUNTER — Encounter: Payer: Medicaid Other | Admitting: Advanced Practice Midwife

## 2018-01-03 LAB — CULTURE, BETA STREP (GROUP B ONLY): Strep Gp B Culture: NEGATIVE

## 2018-01-06 ENCOUNTER — Ambulatory Visit (INDEPENDENT_AMBULATORY_CARE_PROVIDER_SITE_OTHER): Payer: Medicaid Other | Admitting: Advanced Practice Midwife

## 2018-01-06 ENCOUNTER — Inpatient Hospital Stay (HOSPITAL_COMMUNITY)
Admission: AD | Admit: 2018-01-06 | Discharge: 2018-01-06 | Disposition: A | Discharge status: Home / Self Care | Payer: Medicaid Other | Source: Ambulatory Visit | Attending: Obstetrics & Gynecology | Admitting: Obstetrics & Gynecology

## 2018-01-06 ENCOUNTER — Inpatient Hospital Stay (HOSPITAL_BASED_OUTPATIENT_CLINIC_OR_DEPARTMENT_OTHER): Payer: Medicaid Other

## 2018-01-06 ENCOUNTER — Telehealth (HOSPITAL_COMMUNITY): Payer: Self-pay | Admitting: *Deleted

## 2018-01-06 ENCOUNTER — Encounter (HOSPITAL_COMMUNITY): Payer: Self-pay | Admitting: *Deleted

## 2018-01-06 VITALS — BP 117/80 | HR 79 | Wt 244.0 lb

## 2018-01-06 DIAGNOSIS — O24419 Gestational diabetes mellitus in pregnancy, unspecified control: Secondary | ICD-10-CM

## 2018-01-06 DIAGNOSIS — K219 Gastro-esophageal reflux disease without esophagitis: Secondary | ICD-10-CM | POA: Diagnosis not present

## 2018-01-06 DIAGNOSIS — O99613 Diseases of the digestive system complicating pregnancy, third trimester: Secondary | ICD-10-CM | POA: Insufficient documentation

## 2018-01-06 DIAGNOSIS — O321XX0 Maternal care for breech presentation, not applicable or unspecified: Secondary | ICD-10-CM | POA: Diagnosis not present

## 2018-01-06 DIAGNOSIS — Z3689 Encounter for other specified antenatal screening: Secondary | ICD-10-CM | POA: Diagnosis not present

## 2018-01-06 DIAGNOSIS — Z3A38 38 weeks gestation of pregnancy: Secondary | ICD-10-CM | POA: Diagnosis not present

## 2018-01-06 DIAGNOSIS — O99343 Other mental disorders complicating pregnancy, third trimester: Secondary | ICD-10-CM | POA: Diagnosis not present

## 2018-01-06 DIAGNOSIS — O99213 Obesity complicating pregnancy, third trimester: Secondary | ICD-10-CM | POA: Diagnosis not present

## 2018-01-06 DIAGNOSIS — Z87891 Personal history of nicotine dependence: Secondary | ICD-10-CM | POA: Diagnosis not present

## 2018-01-06 DIAGNOSIS — O34219 Maternal care for unspecified type scar from previous cesarean delivery: Secondary | ICD-10-CM | POA: Diagnosis not present

## 2018-01-06 DIAGNOSIS — F419 Anxiety disorder, unspecified: Secondary | ICD-10-CM | POA: Insufficient documentation

## 2018-01-06 DIAGNOSIS — Z9289 Personal history of other medical treatment: Secondary | ICD-10-CM | POA: Diagnosis not present

## 2018-01-06 DIAGNOSIS — O289 Unspecified abnormal findings on antenatal screening of mother: Secondary | ICD-10-CM

## 2018-01-06 DIAGNOSIS — E669 Obesity, unspecified: Secondary | ICD-10-CM | POA: Diagnosis not present

## 2018-01-06 LAB — POCT CBG (FASTING - GLUCOSE)-MANUAL ENTRY: Glucose Fasting, POC: 101 mg/dL — AB (ref 70–99)

## 2018-01-06 NOTE — Telephone Encounter (Signed)
Preadmission screen  

## 2018-01-06 NOTE — Progress Notes (Signed)
?   Cuff too smal

## 2018-01-06 NOTE — Patient Instructions (Signed)
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
## 2018-01-06 NOTE — Discharge Instructions (Signed)
°  How do I know if my baby is breech? There are no symptoms for you to know that your baby is breech. When you are close to your due date, your health care provider can tell if your baby is breech by:  An abdominal or vaginal (pelvic) exam.  An ultrasound.  Your health care provider may also be able to tell that your baby is breech if your babys heartbeat is heard above your belly button. What can be done if my baby is breech?  Your health care provider may try to turn the baby in your uterus. This is a procedure called external cephalic version (ECV). This is done by your health care provider. He or she will place both hands on your abdomen and gently and slowly turn the baby around. It is important to know that ECV can increase your chances of suddenly going into labor. If an ECV is done, it is done toward the end of a healthy pregnancy. The baby may remain in this position or he or she may turn back to the breech position. You and your health care provider will discuss if an ECV is recommended for you and your baby.

## 2018-01-09 ENCOUNTER — Inpatient Hospital Stay (HOSPITAL_COMMUNITY): Payer: Medicaid Other | Admitting: Anesthesiology

## 2018-01-09 ENCOUNTER — Encounter (HOSPITAL_COMMUNITY)
Admission: AD | Disposition: A | Discharge status: Home / Self Care | Payer: Self-pay | Source: Home / Self Care | Attending: Obstetrics and Gynecology

## 2018-01-09 ENCOUNTER — Inpatient Hospital Stay (HOSPITAL_COMMUNITY)
Admission: AD | Admit: 2018-01-09 | Discharge: 2018-01-12 | DRG: 788 | Disposition: A | Discharge status: Home / Self Care | Payer: Medicaid Other | Attending: Obstetrics and Gynecology | Admitting: Obstetrics and Gynecology

## 2018-01-09 ENCOUNTER — Encounter (HOSPITAL_COMMUNITY): Payer: Self-pay | Admitting: *Deleted

## 2018-01-09 ENCOUNTER — Encounter: Payer: Self-pay | Admitting: Obstetrics and Gynecology

## 2018-01-09 ENCOUNTER — Other Ambulatory Visit: Payer: Self-pay

## 2018-01-09 DIAGNOSIS — O134 Gestational [pregnancy-induced] hypertension without significant proteinuria, complicating childbirth: Secondary | ICD-10-CM | POA: Diagnosis present

## 2018-01-09 DIAGNOSIS — O329XX0 Maternal care for malpresentation of fetus, unspecified, not applicable or unspecified: Secondary | ICD-10-CM

## 2018-01-09 DIAGNOSIS — Z91199 Patient's noncompliance with other medical treatment and regimen due to unspecified reason: Secondary | ICD-10-CM | POA: Insufficient documentation

## 2018-01-09 DIAGNOSIS — E559 Vitamin D deficiency, unspecified: Secondary | ICD-10-CM | POA: Diagnosis present

## 2018-01-09 DIAGNOSIS — Z9119 Patient's noncompliance with other medical treatment and regimen: Secondary | ICD-10-CM | POA: Insufficient documentation

## 2018-01-09 DIAGNOSIS — Z98891 History of uterine scar from previous surgery: Secondary | ICD-10-CM

## 2018-01-09 DIAGNOSIS — F319 Bipolar disorder, unspecified: Secondary | ICD-10-CM | POA: Diagnosis present

## 2018-01-09 DIAGNOSIS — O24420 Gestational diabetes mellitus in childbirth, diet controlled: Secondary | ICD-10-CM | POA: Diagnosis present

## 2018-01-09 DIAGNOSIS — F4310 Post-traumatic stress disorder, unspecified: Secondary | ICD-10-CM | POA: Diagnosis present

## 2018-01-09 DIAGNOSIS — F909 Attention-deficit hyperactivity disorder, unspecified type: Secondary | ICD-10-CM | POA: Diagnosis present

## 2018-01-09 DIAGNOSIS — O34211 Maternal care for low transverse scar from previous cesarean delivery: Secondary | ICD-10-CM | POA: Diagnosis present

## 2018-01-09 DIAGNOSIS — O139 Gestational [pregnancy-induced] hypertension without significant proteinuria, unspecified trimester: Secondary | ICD-10-CM

## 2018-01-09 DIAGNOSIS — Z3A39 39 weeks gestation of pregnancy: Secondary | ICD-10-CM

## 2018-01-09 DIAGNOSIS — Z283 Underimmunization status: Secondary | ICD-10-CM

## 2018-01-09 DIAGNOSIS — Z880 Allergy status to penicillin: Secondary | ICD-10-CM

## 2018-01-09 DIAGNOSIS — O321XX0 Maternal care for breech presentation, not applicable or unspecified: Secondary | ICD-10-CM | POA: Diagnosis present

## 2018-01-09 DIAGNOSIS — A5901 Trichomonal vulvovaginitis: Secondary | ICD-10-CM | POA: Diagnosis present

## 2018-01-09 DIAGNOSIS — O24429 Gestational diabetes mellitus in childbirth, unspecified control: Secondary | ICD-10-CM | POA: Diagnosis not present

## 2018-01-09 DIAGNOSIS — F419 Anxiety disorder, unspecified: Secondary | ICD-10-CM

## 2018-01-09 DIAGNOSIS — O9989 Other specified diseases and conditions complicating pregnancy, childbirth and the puerperium: Secondary | ICD-10-CM

## 2018-01-09 DIAGNOSIS — Z87898 Personal history of other specified conditions: Secondary | ICD-10-CM

## 2018-01-09 DIAGNOSIS — O99212 Obesity complicating pregnancy, second trimester: Secondary | ICD-10-CM | POA: Diagnosis present

## 2018-01-09 DIAGNOSIS — O23599 Infection of other part of genital tract in pregnancy, unspecified trimester: Secondary | ICD-10-CM

## 2018-01-09 DIAGNOSIS — F1291 Cannabis use, unspecified, in remission: Secondary | ICD-10-CM

## 2018-01-09 DIAGNOSIS — O98213 Gonorrhea complicating pregnancy, third trimester: Secondary | ICD-10-CM

## 2018-01-09 DIAGNOSIS — Z87891 Personal history of nicotine dependence: Secondary | ICD-10-CM

## 2018-01-09 DIAGNOSIS — O0990 Supervision of high risk pregnancy, unspecified, unspecified trimester: Secondary | ICD-10-CM

## 2018-01-09 DIAGNOSIS — F329 Major depressive disorder, single episode, unspecified: Secondary | ICD-10-CM | POA: Diagnosis present

## 2018-01-09 DIAGNOSIS — T7491XA Unspecified adult maltreatment, confirmed, initial encounter: Secondary | ICD-10-CM | POA: Diagnosis present

## 2018-01-09 DIAGNOSIS — O09899 Supervision of other high risk pregnancies, unspecified trimester: Secondary | ICD-10-CM

## 2018-01-09 DIAGNOSIS — O24419 Gestational diabetes mellitus in pregnancy, unspecified control: Secondary | ICD-10-CM | POA: Diagnosis present

## 2018-01-09 HISTORY — DX: Essential (primary) hypertension: I10

## 2018-01-09 LAB — CBC
HEMATOCRIT: 33.8 % — AB (ref 36.0–46.0)
Hemoglobin: 11.2 g/dL — ABNORMAL LOW (ref 12.0–15.0)
MCH: 28.2 pg (ref 26.0–34.0)
MCHC: 33.1 g/dL (ref 30.0–36.0)
MCV: 85.1 fL (ref 78.0–100.0)
Platelets: 211 10*3/uL (ref 150–400)
RBC: 3.97 MIL/uL (ref 3.87–5.11)
RDW: 13.5 % (ref 11.5–15.5)
WBC: 8.1 10*3/uL (ref 4.0–10.5)

## 2018-01-09 LAB — COMPREHENSIVE METABOLIC PANEL
ALT: 19 U/L (ref 0–44)
AST: 27 U/L (ref 15–41)
Albumin: 2.9 g/dL — ABNORMAL LOW (ref 3.5–5.0)
Alkaline Phosphatase: 89 U/L (ref 38–126)
Anion gap: 10 (ref 5–15)
BILIRUBIN TOTAL: 0.6 mg/dL (ref 0.3–1.2)
BUN: 8 mg/dL (ref 6–20)
CHLORIDE: 106 mmol/L (ref 98–111)
CO2: 21 mmol/L — ABNORMAL LOW (ref 22–32)
Calcium: 8.9 mg/dL (ref 8.9–10.3)
Creatinine, Ser: 0.55 mg/dL (ref 0.44–1.00)
GFR calc Af Amer: >60 mL/min (ref >60)
GFR calc non Af Amer: >60 mL/min (ref >60)
Glucose, Bld: 75 mg/dL (ref 70–99)
POTASSIUM: 4 mmol/L (ref 3.5–5.1)
Sodium: 137 mmol/L (ref 135–145)
Total Protein: 6 g/dL — ABNORMAL LOW (ref 6.5–8.1)

## 2018-01-09 LAB — TYPE AND SCREEN
ABO/RH(D): O POS
Antibody Screen: NEGATIVE

## 2018-01-09 LAB — PROTEIN / CREATININE RATIO, URINE
Creatinine, Urine: 145 mg/dL
Protein Creatinine Ratio: 0.11 mg/mg{Cre} (ref 0.00–0.15)
Total Protein, Urine: 16 mg/dL

## 2018-01-09 LAB — AMNISURE RUPTURE OF MEMBRANE (ROM) NOT AT ARMC: Amnisure ROM: NEGATIVE

## 2018-01-09 LAB — GLUCOSE, CAPILLARY: Glucose-Capillary: 102 mg/dL — ABNORMAL HIGH (ref 70–99)

## 2018-01-09 LAB — POCT FERN TEST: POCT Fern Test: NEGATIVE

## 2018-01-09 SURGERY — Surgical Case
Anesthesia: Spinal | Wound class: Clean Contaminated

## 2018-01-09 MED ORDER — PHENYLEPHRINE HCL 10 MG/ML IJ SOLN
INTRAMUSCULAR | Status: DC | PRN
  Administered 2018-01-09: 120 ug via INTRAVENOUS
  Administered 2018-01-09: 160 ug via INTRAVENOUS
  Administered 2018-01-09: 80 ug via INTRAVENOUS
  Administered 2018-01-09: 40 ug via INTRAVENOUS
  Administered 2018-01-09: 80 ug via INTRAVENOUS

## 2018-01-09 MED ORDER — OXYTOCIN 10 UNIT/ML IJ SOLN
INTRAVENOUS | Status: DC | PRN
  Administered 2018-01-09: 40 [IU] via INTRAVENOUS

## 2018-01-09 MED ORDER — COCONUT OIL OIL
1.0000 "application " | TOPICAL_OIL | Status: DC | PRN
  Administered 2018-01-11: 1 via TOPICAL
  Filled 2018-01-09: qty 120

## 2018-01-09 MED ORDER — OXYTOCIN BOLUS FROM INFUSION: 500.0000 mL | Freq: Once | INTRAVENOUS | Status: DC

## 2018-01-09 MED ORDER — MEPERIDINE HCL 25 MG/ML IJ SOLN: 6.2500 mg | INTRAMUSCULAR | Status: DC | PRN

## 2018-01-09 MED ORDER — LIDOCAINE HCL (PF) 1 % IJ SOLN
INTRAMUSCULAR | Status: AC
  Filled 2018-01-09: qty 5

## 2018-01-09 MED ORDER — OXYCODONE HCL 5 MG PO TABS
5.0000 mg | ORAL_TABLET | Freq: Four times a day (QID) | ORAL | Status: DC | PRN
  Administered 2018-01-10: 5 mg via ORAL
  Filled 2018-01-09: qty 1

## 2018-01-09 MED ORDER — HYDROMORPHONE HCL 1 MG/ML IJ SOLN: 0.2500 mg | INTRAMUSCULAR | Status: DC | PRN

## 2018-01-09 MED ORDER — KETOROLAC TROMETHAMINE 30 MG/ML IJ SOLN
30.0000 mg | Freq: Once | INTRAMUSCULAR | Status: AC | PRN
  Administered 2018-01-09: 30 mg via INTRAVENOUS

## 2018-01-09 MED ORDER — NALBUPHINE HCL 10 MG/ML IJ SOLN
5.0000 mg | INTRAMUSCULAR | Status: DC | PRN
  Administered 2018-01-09 – 2018-01-10 (×3): 5 mg via INTRAVENOUS
  Filled 2018-01-09 (×2): qty 1

## 2018-01-09 MED ORDER — ACETAMINOPHEN 325 MG PO TABS
650.0000 mg | ORAL_TABLET | ORAL | Status: DC | PRN
  Administered 2018-01-10 – 2018-01-12 (×9): 650 mg via ORAL
  Filled 2018-01-09 (×9): qty 2

## 2018-01-09 MED ORDER — DIPHENHYDRAMINE HCL 50 MG/ML IJ SOLN: 12.5000 mg | INTRAMUSCULAR | Status: DC | PRN

## 2018-01-09 MED ORDER — OXYCODONE HCL 5 MG PO TABS
10.0000 mg | ORAL_TABLET | Freq: Four times a day (QID) | ORAL | Status: DC | PRN
  Filled 2018-01-09: qty 2

## 2018-01-09 MED ORDER — ONDANSETRON HCL 4 MG/2ML IJ SOLN
INTRAMUSCULAR | Status: AC
  Filled 2018-01-09: qty 2

## 2018-01-09 MED ORDER — OXYCODONE HCL 5 MG/5ML PO SOLN: 5.0000 mg | Freq: Once | ORAL | Status: DC | PRN

## 2018-01-09 MED ORDER — NALBUPHINE HCL 10 MG/ML IJ SOLN
5.0000 mg | Freq: Once | INTRAMUSCULAR | Status: AC | PRN
  Filled 2018-01-09: qty 1

## 2018-01-09 MED ORDER — OXYTOCIN 40 UNITS IN LACTATED RINGERS INFUSION - SIMPLE MED: 2.5000 [IU]/h | INTRAVENOUS | Status: DC

## 2018-01-09 MED ORDER — SENNOSIDES-DOCUSATE SODIUM 8.6-50 MG PO TABS
2.0000 | ORAL_TABLET | ORAL | Status: DC
  Administered 2018-01-10 – 2018-01-11 (×3): 2 via ORAL
  Filled 2018-01-09 (×3): qty 2

## 2018-01-09 MED ORDER — FENTANYL CITRATE (PF) 100 MCG/2ML IJ SOLN
INTRAMUSCULAR | Status: AC
  Filled 2018-01-09: qty 2

## 2018-01-09 MED ORDER — CLINDAMYCIN PHOSPHATE 900 MG/50ML IV SOLN: 900.0000 mg | INTRAVENOUS | Status: DC

## 2018-01-09 MED ORDER — LACTATED RINGERS IV SOLN
INTRAVENOUS | Status: DC
  Administered 2018-01-09 (×2): via INTRAVENOUS

## 2018-01-09 MED ORDER — MENTHOL 3 MG MT LOZG: 1.0000 | LOZENGE | OROMUCOSAL | Status: DC | PRN

## 2018-01-09 MED ORDER — ONDANSETRON HCL 4 MG/2ML IJ SOLN
INTRAMUSCULAR | Status: DC | PRN
  Administered 2018-01-09: 4 mg via INTRAVENOUS

## 2018-01-09 MED ORDER — GENTAMICIN SULFATE 40 MG/ML IJ SOLN
5.0000 mg/kg | Freq: Once | INTRAVENOUS | Status: AC
  Administered 2018-01-09: 410 mg via INTRAVENOUS
  Filled 2018-01-09: qty 10.25

## 2018-01-09 MED ORDER — SCOPOLAMINE 1 MG/3DAYS TD PT72
1.0000 | MEDICATED_PATCH | Freq: Once | TRANSDERMAL | Status: AC
  Administered 2018-01-09: 1.5 mg via TRANSDERMAL

## 2018-01-09 MED ORDER — SODIUM CHLORIDE 0.9 % IR SOLN
Status: DC | PRN
  Administered 2018-01-09: 1000 mL

## 2018-01-09 MED ORDER — PROMETHAZINE HCL 25 MG/ML IJ SOLN: 6.2500 mg | INTRAMUSCULAR | Status: DC | PRN

## 2018-01-09 MED ORDER — ONDANSETRON HCL 4 MG/2ML IJ SOLN: 4.0000 mg | Freq: Three times a day (TID) | INTRAMUSCULAR | Status: DC | PRN

## 2018-01-09 MED ORDER — SCOPOLAMINE 1 MG/3DAYS TD PT72
MEDICATED_PATCH | TRANSDERMAL | Status: AC
  Filled 2018-01-09: qty 1

## 2018-01-09 MED ORDER — DIBUCAINE 1 % RE OINT: 1.0000 "application " | TOPICAL_OINTMENT | RECTAL | Status: DC | PRN

## 2018-01-09 MED ORDER — GENTAMICIN SULFATE 40 MG/ML IJ SOLN: 5.0000 mg/kg | INTRAVENOUS | Status: DC

## 2018-01-09 MED ORDER — TERBUTALINE SULFATE 1 MG/ML IJ SOLN
0.2500 mg | Freq: Once | INTRAMUSCULAR | Status: AC
  Administered 2018-01-09: 0.25 mg via SUBCUTANEOUS
  Filled 2018-01-09: qty 1

## 2018-01-09 MED ORDER — OXYCODONE HCL 5 MG PO TABS: 5.0000 mg | ORAL_TABLET | Freq: Once | ORAL | Status: DC | PRN

## 2018-01-09 MED ORDER — SIMETHICONE 80 MG PO CHEW
80.0000 mg | CHEWABLE_TABLET | Freq: Three times a day (TID) | ORAL | Status: DC
  Administered 2018-01-10 – 2018-01-12 (×5): 80 mg via ORAL
  Filled 2018-01-09 (×6): qty 1

## 2018-01-09 MED ORDER — POLYETHYLENE GLYCOL 3350 17 G PO PACK
17.0000 g | PACK | Freq: Every day | ORAL | Status: DC
  Filled 2018-01-09 (×4): qty 1

## 2018-01-09 MED ORDER — PHENYLEPHRINE 8 MG IN D5W 100 ML (0.08MG/ML) PREMIX OPTIME
INJECTION | INTRAVENOUS | Status: DC | PRN
  Administered 2018-01-09: 60 ug/min via INTRAVENOUS

## 2018-01-09 MED ORDER — TETANUS-DIPHTH-ACELL PERTUSSIS 5-2.5-18.5 LF-MCG/0.5 IM SUSP: 0.5000 mL | Freq: Once | INTRAMUSCULAR | Status: DC

## 2018-01-09 MED ORDER — MORPHINE SULFATE (PF) 0.5 MG/ML IJ SOLN
INTRAMUSCULAR | Status: DC | PRN
  Administered 2018-01-09: .15 mg via INTRATHECAL

## 2018-01-09 MED ORDER — NALOXONE HCL 0.4 MG/ML IJ SOLN: 0.4000 mg | INTRAMUSCULAR | Status: DC | PRN

## 2018-01-09 MED ORDER — STERILE WATER FOR IRRIGATION IR SOLN
Status: DC | PRN
  Administered 2018-01-09: 1000 mL

## 2018-01-09 MED ORDER — FENTANYL CITRATE (PF) 100 MCG/2ML IJ SOLN
INTRAMUSCULAR | Status: DC | PRN
  Administered 2018-01-09: 15 ug via INTRATHECAL

## 2018-01-09 MED ORDER — OXYTOCIN 10 UNIT/ML IJ SOLN
INTRAMUSCULAR | Status: AC
  Filled 2018-01-09: qty 4

## 2018-01-09 MED ORDER — PRENATAL MULTIVITAMIN CH
1.0000 | ORAL_TABLET | Freq: Every day | ORAL | Status: DC
  Filled 2018-01-09: qty 1

## 2018-01-09 MED ORDER — MORPHINE SULFATE (PF) 0.5 MG/ML IJ SOLN
INTRAMUSCULAR | Status: AC
  Filled 2018-01-09: qty 10

## 2018-01-09 MED ORDER — ONDANSETRON HCL 4 MG/2ML IJ SOLN: 4.0000 mg | Freq: Four times a day (QID) | INTRAMUSCULAR | Status: DC | PRN

## 2018-01-09 MED ORDER — BUPIVACAINE IN DEXTROSE 0.75-8.25 % IT SOLN
INTRATHECAL | Status: DC | PRN
  Administered 2018-01-09: 1.6 mL via INTRATHECAL

## 2018-01-09 MED ORDER — LACTATED RINGERS IV SOLN: 500.0000 mL | INTRAVENOUS | Status: DC | PRN

## 2018-01-09 MED ORDER — SODIUM CHLORIDE 0.9% FLUSH: 3.0000 mL | INTRAVENOUS | Status: DC | PRN

## 2018-01-09 MED ORDER — DIPHENHYDRAMINE HCL 25 MG PO CAPS
25.0000 mg | ORAL_CAPSULE | ORAL | Status: DC | PRN
  Administered 2018-01-10: 25 mg via ORAL
  Filled 2018-01-09 (×3): qty 1

## 2018-01-09 MED ORDER — LIDOCAINE HCL (PF) 1 % IJ SOLN: 30.0000 mL | INTRAMUSCULAR | Status: DC | PRN

## 2018-01-09 MED ORDER — KETOROLAC TROMETHAMINE 30 MG/ML IJ SOLN
INTRAMUSCULAR | Status: AC
  Filled 2018-01-09: qty 1

## 2018-01-09 MED ORDER — IBUPROFEN 600 MG PO TABS
600.0000 mg | ORAL_TABLET | Freq: Four times a day (QID) | ORAL | Status: DC
  Administered 2018-01-10 – 2018-01-12 (×11): 600 mg via ORAL
  Filled 2018-01-09 (×11): qty 1

## 2018-01-09 MED ORDER — NALBUPHINE HCL 10 MG/ML IJ SOLN: 5.0000 mg | INTRAMUSCULAR | Status: DC | PRN

## 2018-01-09 MED ORDER — SOD CITRATE-CITRIC ACID 500-334 MG/5ML PO SOLN
30.0000 mL | ORAL | Status: DC | PRN
  Administered 2018-01-09: 30 mL via ORAL
  Filled 2018-01-09: qty 15

## 2018-01-09 MED ORDER — LACTATED RINGERS IV SOLN
INTRAVENOUS | Status: DC
  Administered 2018-01-10 (×2): via INTRAVENOUS

## 2018-01-09 MED ORDER — WITCH HAZEL-GLYCERIN EX PADS: 1.0000 "application " | MEDICATED_PAD | CUTANEOUS | Status: DC | PRN

## 2018-01-09 MED ORDER — NALBUPHINE HCL 10 MG/ML IJ SOLN
5.0000 mg | Freq: Once | INTRAMUSCULAR | Status: AC | PRN
  Administered 2018-01-10: 5 mg via SUBCUTANEOUS
  Filled 2018-01-09: qty 1

## 2018-01-09 MED ORDER — NALOXONE HCL 4 MG/10ML IJ SOLN
1.0000 ug/kg/h | INTRAVENOUS | Status: DC | PRN
  Filled 2018-01-09: qty 5

## 2018-01-09 MED ORDER — DIPHENHYDRAMINE HCL 25 MG PO CAPS
25.0000 mg | ORAL_CAPSULE | Freq: Four times a day (QID) | ORAL | Status: DC | PRN
  Administered 2018-01-09: 25 mg via ORAL

## 2018-01-09 MED ORDER — ENOXAPARIN SODIUM 40 MG/0.4ML ~~LOC~~ SOLN
40.0000 mg | SUBCUTANEOUS | Status: DC
  Filled 2018-01-09: qty 0.4

## 2018-01-09 MED ORDER — SOD CITRATE-CITRIC ACID 500-334 MG/5ML PO SOLN: 30.0000 mL | ORAL | Status: DC

## 2018-01-09 MED ORDER — OXYTOCIN 40 UNITS IN LACTATED RINGERS INFUSION - SIMPLE MED: 2.5000 [IU]/h | INTRAVENOUS | Status: AC

## 2018-01-09 SURGICAL SUPPLY — 35 items
BENZOIN TINCTURE PRP APPL 2/3 (GAUZE/BANDAGES/DRESSINGS) ×3 IMPLANT
CANISTER SUCT 3000ML PPV (MISCELLANEOUS) ×3 IMPLANT
CHLORAPREP W/TINT 26ML (MISCELLANEOUS) ×3 IMPLANT
CLOSURE WOUND 1/2 X4 (GAUZE/BANDAGES/DRESSINGS) ×1
DRSG OPSITE POSTOP 4X10 (GAUZE/BANDAGES/DRESSINGS) ×3 IMPLANT
ELECT REM PT RETURN 9FT ADLT (ELECTROSURGICAL) ×3
ELECTRODE REM PT RTRN 9FT ADLT (ELECTROSURGICAL) ×1 IMPLANT
EXTRACTOR VACUUM KIWI (MISCELLANEOUS) ×3 IMPLANT
GAUZE SPONGE 4X4 12PLY STRL LF (GAUZE/BANDAGES/DRESSINGS) ×6 IMPLANT
GLOVE BIOGEL PI IND STRL 7.0 (GLOVE) ×2 IMPLANT
GLOVE BIOGEL PI IND STRL 7.5 (GLOVE) ×1 IMPLANT
GLOVE BIOGEL PI INDICATOR 7.0 (GLOVE) ×4
GLOVE BIOGEL PI INDICATOR 7.5 (GLOVE) ×2
GLOVE SKINSENSE NS SZ7.0 (GLOVE) ×2
GLOVE SKINSENSE STRL SZ7.0 (GLOVE) ×1 IMPLANT
GOWN STRL REUS W/ TWL LRG LVL3 (GOWN DISPOSABLE) ×2 IMPLANT
GOWN STRL REUS W/ TWL XL LVL3 (GOWN DISPOSABLE) ×1 IMPLANT
GOWN STRL REUS W/TWL LRG LVL3 (GOWN DISPOSABLE) ×4
GOWN STRL REUS W/TWL XL LVL3 (GOWN DISPOSABLE) ×2
NS IRRIG 1000ML POUR BTL (IV SOLUTION) ×3 IMPLANT
PACK C SECTION WH (CUSTOM PROCEDURE TRAY) ×3 IMPLANT
PAD ABD 7.5X8 STRL (GAUZE/BANDAGES/DRESSINGS) ×3 IMPLANT
PAD OB MATERNITY 4.3X12.25 (PERSONAL CARE ITEMS) ×3 IMPLANT
PAD PREP 24X48 CUFFED NSTRL (MISCELLANEOUS) ×3 IMPLANT
PENCIL SMOKE EVAC W/HOLSTER (ELECTROSURGICAL) ×3 IMPLANT
SPONGE LAP 18X18 X RAY DECT (DISPOSABLE) ×9 IMPLANT
STRIP CLOSURE SKIN 1/2X4 (GAUZE/BANDAGES/DRESSINGS) ×2 IMPLANT
SUT MNCRL 0 VIOLET CTX 36 (SUTURE) ×2 IMPLANT
SUT MON AB 4-0 PS1 27 (SUTURE) ×3 IMPLANT
SUT MONOCRYL 0 CTX 36 (SUTURE) ×4
SUT PLAIN 2 0 XLH (SUTURE) ×3 IMPLANT
SUT VIC AB 0 CT1 36 (SUTURE) ×6 IMPLANT
SUT VIC AB 3-0 CT1 27 (SUTURE) ×2
SUT VIC AB 3-0 CT1 TAPERPNT 27 (SUTURE) ×1 IMPLANT
TOWEL OR 17X24 6PK STRL BLUE (TOWEL DISPOSABLE) ×6 IMPLANT

## 2018-01-09 NOTE — Progress Notes (Signed)
Dr. Vergie LivingPickens @ bedside discussing POC.  MD reports fetus remains breech, MD suggest pt have scheduled Cesarean secondary amniotic fluid low and version attempt will probably fail.  Pt given opportunity to think about options because pt desires version versus Cesarean Section.

## 2018-01-09 NOTE — MAU Note (Signed)
Pt presents with c/o H/A, pelvic pressure, and vaginal "wetness".  Reports wetness began yesterday, unsure if LOF.  States believes has sinus H/A, hasn't taken meds for treatment. Pt reports fetus breech, scheduled for version tomorrow.
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
## 2018-01-09 NOTE — Progress Notes (Signed)
1820 CBG in PACU-87

## 2018-01-09 NOTE — Procedures (Signed)
External Cephalic Version Procedure Note Patient consented for ECV after u/s shows head still in LUQ and head maternal right.  Category I with accels and no UCs on EFM. Terbutaline given and three front rolls attempted, all lasting approx 30-60s with recovery of 30-60s in between attempts. Normal FHR in between attempts. No movement with rolls and recommendation for c/s and pt is now amenable to this.  Cynthia Kent, Jr MD [facility]ending [facility] 01/09/2018 Time: [phone]

## 2018-01-09 NOTE — Transfer of Care (Signed)
Immediate Anesthesia Transfer of Care Note  Patient: Cynthia Kent  Procedure(s) Performed: CESAREAN SECTION (N/A )  Patient Location: PACU  Anesthesia Type:Epidural  Level of Consciousness: awake  Airway & Oxygen Therapy: Patient Spontanous Breathing  Post-op Assessment: Report given to RN and Post -op Vital signs reviewed and stable  Post vital signs: stable  Last Vitals:  Vitals Value Taken Time  BP 114/55 01/09/2018  6:02 PM  Temp    Pulse 84 01/09/2018  6:05 PM  Resp 22 01/09/2018  6:05 PM  SpO2 96 % 01/09/2018  6:05 PM  Vitals shown include unvalidated device data.  Last Pain:  Vitals:   01/09/18 1355  TempSrc: Oral  PainSc:          Complications: No apparent anesthesia complications

## 2018-01-09 NOTE — Anesthesia Procedure Notes (Signed)
Spinal  Patient location during procedure: OB Start time: 01/09/2018 4:34 PM End time: 01/09/2018 4:39 PM Staffing Anesthesiologist: Lowella CurbMiller, Aditi Rovira Ray, MD Performed: anesthesiologist  Preanesthetic Checklist Completed: patient identified, surgical consent, pre-op evaluation, timeout performed, IV checked, risks and benefits discussed and monitors and equipment checked Spinal Block Patient position: sitting Prep: site prepped and draped and DuraPrep Patient monitoring: heart rate, cardiac monitor, continuous pulse ox and blood pressure Approach: right paramedian Location: L3-4 Injection technique: single-shot Needle Needle type: Pencan  Needle gauge: 24 G Needle length: 10 cm Assessment Sensory level: T4

## 2018-01-09 NOTE — Progress Notes (Signed)
EFM off from [phone] while Dr. Vergie LivingPickens performing bedside U/S

## 2018-01-09 NOTE — Progress Notes (Addendum)
MAU Note Breech, head in LUQ, spine to the RUQ, AFI low at 4.68, FHR normal, negative amnisure and fern and pt had low normal AFI on 9/13. D/w pt that I recommend rpt c/s and not an ECV attempt given risks to fetus, such as abruption, need for stat c/s, etc and low chance of success. Pt to let us know and will proceed from there. NPO since MN. CBC normal, rest of admit labs pending. Pt states she's not checking her blood sugars  Cornelia Copaharlie Larenz Frasier, Jr MD Attending [facility] 01/09/2018 Time: 1213pm

## 2018-01-09 NOTE — Op Note (Signed)
Operative Note   SURGERY DATE: 01/09/2018  PRE-OP DIAGNOSIS:  *Pregnancy at 39/2 *New gestational hypertension *Complete breech *Status post failed external cephalic version *GDM (patient not checking her blood sugars due to non compliance)  POST-OP DIAGNOSIS: Same. Frank breech. Delivered   PROCEDURE: repeat low transverse cesarean section via pfannenstiel skin incision with double layer uterine closure  SURGEON: Surgeon(s) and Role:    * [place] BingPickens, Riel Hirschman, MD - Primary  ASSISTANT: None  ANESTHESIA: spinal  ESTIMATED BLOOD LOSS: 226mL  DRAINS: 200mL UOP via indwelling foley  TOTAL IV FLUIDS: 1400mL crystalloid  VTE PROPHYLAXIS: SCDs to bilateral lower extremities  ANTIBIOTICS: clindamycin and gentamycin, within 1 hour of skin incision  SPECIMENS: None  COMPLICATIONS: None  FINDINGS: No intra-abdominal adhesions were noted. Grossly normal uterus, tubes and ovaries. Clear amniotic fluid, breech female infant, weight 3805gm, APGARs 4/9, intact placenta.  PROCEDURE IN DETAIL: The patient was taken to the operating room where anesthesia was administered and normal fetal heart tones were confirmed. She was then prepped and draped in the normal fashion in the dorsal supine position with a leftward tilt.  After a time out was performed, a pfannensteil skin incision was made with the scalpel and carried through to the underlying layer of fascia. The fascia was then incised at the midline and this incision was extended laterally with the mayo scissors. Attention was turned to the superior aspect of the fascial incision which was grasped with the kocher clamps x 2, tented up and the rectus muscles were dissected off with the scalpel. In a similar fashion the inferior aspect of the fascial incision was grasped with the kocher clamps, tented up and the rectus muscles dissected off with the mayo scissors. The rectus muscles were then separated in the midline and the peritoneum was entered  bluntly. The bladder blade was inserted and the vesicouterine peritoneum was identified, tented up and entered with the metzenbaum scissors. This incision was extended laterally and the bladder flap was created digitally. The bladder blade was reinserted.  A low transverse hysterotomy was made with the scalpel until the endometrial cavity was breached and the amniotic sac ruptured, yielding clear amniotic fluid. This incision was extended bluntly and the infant's lower extremities and hips and delivered it through the incision, as well as the rest of the body and head without difficulty. The cord was clamped x 2 and cut, and the infant was handed to the awaiting pediatricians, after delayed cord clamping was not done.  The placenta was then gradually expressed from the uterus and then the uterus was exteriorized and cleared of all clots and debris. The hysterotomy was repaired with a running suture of 1-0 monocyrl. A second imbricating layer of 1-0 monocryl suture was then placed to achieve excellent hemostasis.   The uterus and adnexa were then returned to the abdomen, and the hysterotomy and all operative sites were reinspected and excellent hemostasis was noted after irrigation and suction of the abdomen with warm saline.  The peritoneum was closed with a running stitch of 3-0 Vicryl. The fascia was reapproximated with 0 Vicryl in a simple running fashion bilaterally. The subcutaneous layer was then reapproximated with interrupted sutures of 2-0 plain gut, and the skin was then closed with 4-0 monocryl, in a subcuticular fashion.  The patient  tolerated the procedure well. Sponge, lap, needle, and instrument counts were correct x 2. The patient was transferred to the recovery room awake, alert and breathing independently in stable condition.  Cynthia Kent, Jr. MD  Attending Center for [employer] [facility])

## 2018-01-09 NOTE — Anesthesia Preprocedure Evaluation (Signed)
Anesthesia Evaluation  Patient identified by MRN, date of birth, ID band Patient awake    Reviewed: Allergy & Precautions, H&P , NPO status , Patient's Chart, lab work & pertinent test results  Airway Mallampati: II  TM Distance: >3 FB Neck ROM: full    Dental no notable dental hx.    Pulmonary former smoker,    Pulmonary exam normal breath sounds clear to auscultation       Cardiovascular hypertension, negative cardio ROS Normal cardiovascular exam Rhythm:Regular Rate:Normal     Neuro/Psych negative neurological ROS     GI/Hepatic Neg liver ROS,   Endo/Other  negative endocrine ROSdiabetes, Gestational  Renal/GU negative Renal ROS     Musculoskeletal   Abdominal (+) + obese,   Peds  Hematology negative hematology ROS (+)   Anesthesia Other Findings   Reproductive/Obstetrics (+) Pregnancy                             Anesthesia Physical  Anesthesia Plan  ASA: III  Anesthesia Plan: Spinal   Post-op Pain Management:    Induction:   PONV Risk Score and Plan: 2 and Treatment may vary due to age or medical condition  Airway Management Planned: Natural Airway  Additional Equipment:   Intra-op Plan:   Post-operative Plan:   Informed Consent: I have reviewed the patients History and Physical, chart, labs and discussed the procedure including the risks, benefits and alternatives for the proposed anesthesia with the patient or authorized representative who has indicated his/her understanding and acceptance.     Plan Discussed with:   Anesthesia Plan Comments:         Anesthesia Quick Evaluation
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
## 2018-01-10 ENCOUNTER — Inpatient Hospital Stay (HOSPITAL_COMMUNITY)
Admit: 2018-01-10 | Discharge: 2018-01-10 | Disposition: A | Discharge status: Home / Self Care | Payer: Medicaid Other | Attending: Obstetrics & Gynecology | Admitting: Obstetrics & Gynecology

## 2018-01-10 ENCOUNTER — Encounter (HOSPITAL_COMMUNITY): Payer: Self-pay | Admitting: Obstetrics and Gynecology

## 2018-01-10 LAB — CBC
HCT: 31.9 % — ABNORMAL LOW (ref 36.0–46.0)
Hemoglobin: 10.6 g/dL — ABNORMAL LOW (ref 12.0–15.0)
MCH: 28.3 pg (ref 26.0–34.0)
MCHC: 33.2 g/dL (ref 30.0–36.0)
MCV: 85.1 fL (ref 78.0–100.0)
PLATELETS: 176 10*3/uL (ref 150–400)
RBC: 3.75 MIL/uL — ABNORMAL LOW (ref 3.87–5.11)
RDW: 13.6 % (ref 11.5–15.5)
WBC: 9.6 10*3/uL (ref 4.0–10.5)

## 2018-01-10 LAB — GLUCOSE, CAPILLARY
GLUCOSE-CAPILLARY: 106 mg/dL — AB (ref 70–99)
GLUCOSE-CAPILLARY: 56 mg/dL — AB (ref 70–99)
Glucose-Capillary: 87 mg/dL (ref 70–99)
Glucose-Capillary: 126 mg/dL — ABNORMAL HIGH (ref 70–99)
Glucose-Capillary: 109 mg/dL — ABNORMAL HIGH (ref 70–99)

## 2018-01-10 LAB — RPR: RPR: NONREACTIVE

## 2018-01-10 MED ORDER — ENOXAPARIN SODIUM 60 MG/0.6ML ~~LOC~~ SOLN
60.0000 mg | SUBCUTANEOUS | Status: DC
  Filled 2018-01-10 (×4): qty 0.6

## 2018-01-10 MED ORDER — OXYCODONE HCL 5 MG PO TABS: 5.0000 mg | ORAL_TABLET | ORAL | Status: DC | PRN

## 2018-01-10 MED ORDER — OXYCODONE HCL 5 MG PO TABS
10.0000 mg | ORAL_TABLET | ORAL | Status: DC | PRN
  Administered 2018-01-10 – 2018-01-12 (×11): 10 mg via ORAL
  Filled 2018-01-10 (×10): qty 2

## 2018-01-10 MED ORDER — COMPLETENATE 29-1 MG PO CHEW
1.0000 | CHEWABLE_TABLET | Freq: Every day | ORAL | Status: DC
  Administered 2018-01-11: 1 via ORAL
  Filled 2018-01-10 (×4): qty 1

## 2018-01-10 NOTE — Progress Notes (Signed)
Pt two hour postprandial CBG -54, Pt given snack and meal ,Dr Homero FellersFrank notified of CBG results.  Will recheck after meal

## 2018-01-10 NOTE — Lactation Note (Signed)
This note was copied from a baby's chart. Lactation Consultation Note  Patient Name: Cynthia Kent ZOXWR'UToday's Date: 01/10/2018 Reason for consult: Initial assessment;Term  Baby is 2221 hours old  As LC entered the room, baby was waking up, and LC changed a wet and a  mec smear.  Placed baby STS on the right breast / assisted mom with pillows , and obtaining  The depth, multiple swallows noted, increased with breast feeding compressions.  LC noted at 1st mom allowing the baby to nibble onto the breast/ and noted some discomfort.  LC showed mom to get the baby to open wide and latch deeper. 2nd latch was able to be independent. Multiple swallows noted with both latches. Mom is an experienced breast feeding mother.  LC discussed the importance of STS feedings until  The baby can stay awake for feedings, and gaining steadily. Also discussed nutritive vs non - nutritive feeding patterns and the importance of watching the baby  Hanging out latched.  Per mom active with [place], WIC .  Will need a hand pump for D/C.  Mother informed of post-discharge support and given phone number to the lactation department, including services for phone call assistance; out-patient appointments; and breastfeeding support group. List of other breastfeeding resources in the community given in the handout. Encouraged mother to call for problems or concerns related to breastfeeding.    Maternal Data Has patient been taught Hand Expression?: Yes Does the patient have breastfeeding experience prior to this delivery?: Yes  Feeding Feeding Type: Breast Fed Length of feed: (multiple swallows )  LATCH Score Latch: Grasps breast easily, tongue down, lips flanged, rhythmical sucking.  Audible Swallowing: Spontaneous and intermittent  Type of Nipple: Everted at rest and after stimulation  Comfort (Breast/Nipple): Soft / non-tender  Hold (Positioning): Assistance needed to correctly position infant at breast and  maintain latch.  LATCH Score: 9  Interventions Interventions: Breast feeding basics reviewed;Assisted with latch;Skin to skin;Breast massage;Breast compression;Adjust position;Support pillows  Lactation Tools Discussed/Used WIC Program: Yes   Consult Status Consult Status: Follow-up Date: 01/11/18 Follow-up type: In-patient    Cynthia SprangMargaret Ann Lumen Kent 01/10/2018, 2:51 PM

## 2018-01-10 NOTE — Anesthesia Postprocedure Evaluation (Signed)
Anesthesia Post Note  Patient: Kooper Little-Faison  Procedure(s) Performed: CESAREAN SECTION (N/A )     Patient location during evaluation: Mother Baby Anesthesia Type: Spinal Level of consciousness: awake, awake and alert and oriented Pain management: pain level controlled Vital Signs Assessment: post-procedure vital signs reviewed and stable Respiratory status: spontaneous breathing, nonlabored ventilation and respiratory function stable Cardiovascular status: stable Postop Assessment: no headache, no backache, patient able to bend at knees, no apparent nausea or vomiting, adequate PO intake and able to ambulate Anesthetic complications: no    Last Vitals:  Vitals:   01/09/18 2253 01/10/18 0310  BP: 131/78   Pulse: 71   Resp: 20 20  Temp: 37.1 C 36.8 C  SpO2: 100% 100%    Last Pain:  Vitals:   01/10/18 0555  TempSrc:   PainSc: 5    Pain Goal: Patients Stated Pain Goal: 5 (01/09/18 1900)               Mare Ludtke

## 2018-01-10 NOTE — Anesthesia Postprocedure Evaluation (Signed)
Anesthesia Post Note  Patient: Cynthia Kent  Procedure(s) Performed: CESAREAN SECTION (N/A )     Patient location during evaluation: PACU Anesthesia Type: Spinal Level of consciousness: oriented and awake and alert Pain management: pain level controlled Vital Signs Assessment: post-procedure vital signs reviewed and stable Respiratory status: spontaneous breathing and respiratory function stable Cardiovascular status: blood pressure returned to baseline and stable Postop Assessment: no headache, no backache and no apparent nausea or vomiting Anesthetic complications: no    Last Vitals:  Vitals:   01/09/18 2253 01/10/18 0310  BP: 131/78   Pulse: 71   Resp: 20 20  Temp: 37.1 C 36.8 C  SpO2: 100% 100%    Last Pain:  Vitals:   01/10/18 0555  TempSrc:   PainSc: 5                  [name]

## 2018-01-10 NOTE — Progress Notes (Addendum)
POSTPARTUM PROGRESS NOTE  Post Partum Day 1 Subjective:  Cynthia Kent is a 27 y.o. G2P1001 2928w3d s/p rLTCS with new gHTN, GDM. She c/o of itchiness, and diffuse abdominal pain around the periumbilical region. She also c/o of H/A, floaters in her eyes, mild SOB, and tingleness in her leg. Pt able to ambulate, although painful, but denies problems voiding or po intake.  She denies nausea or vomiting. Pain is currently not controlled. She has had flatus. She has not had bowel movement.  Lochia Small.   Objective: Blood pressure 120/60, pulse (!) 53, temperature 97.6 F (36.4 C), temperature source Oral, resp. rate 20, height 5' 6.5" (1.689 m), weight 112.5 kg, last menstrual period 04/19/2017, SpO2 100 %, not currently breastfeeding.  Physical Exam:  General: alert, cooperative and no distress Lochia:normal flow Chest: anteriorly CTAB, unable to auscultate posteriorly due to pain  Heart: RRR no m/r/g Abdomen: +BS, soft, tender to palpation diffusely around periumbilical region Uterine Fundus: firm. DVT Evaluation: No calf swelling or tenderness Extremities: no LE edema b/l  Recent Labs    01/09/18 1131 01/10/18 0600  HGB 11.2* 10.6*  HCT 33.8* 31.9*    Assessment/Plan:  ASSESSMENT: Cynthia Kent is a 27 y.o. G2P1001 228w3d s/p  rLTCS with new gHTN, GDM   #Breastfeeding #Hgb 10.6, stable #Glucose 102 #Contraception: unknown at this time #Circumcision to be completed outpatient  #Plan for discharge on POD #2 or #3    LOS: 1 day   Basilio CairoFolafunmi Danica Camarena, Medical Student 01/10/2018, 9:08 AM

## 2018-01-11 DIAGNOSIS — O139 Gestational [pregnancy-induced] hypertension without significant proteinuria, unspecified trimester: Secondary | ICD-10-CM | POA: Diagnosis present

## 2018-01-11 LAB — GLUCOSE, CAPILLARY
Glucose-Capillary: 97 mg/dL (ref 70–99)
Glucose-Capillary: 94 mg/dL (ref 70–99)

## 2018-01-11 NOTE — Progress Notes (Signed)
Morning rounding this pt looked tired and was side lying next to the sleeping infant. This RN offered to put the baby in the bassinet. Pt declined, saying that she slept with her other baby like this and that he was fine, and she wasn't that sleepy.
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
## 2018-01-11 NOTE — Progress Notes (Signed)
This RN entered pt's room while she was eating breakfast. Encouraged her to call out before lunch so we can obtain CBG.

## 2018-01-11 NOTE — Progress Notes (Signed)
RN was in the room when pt got and sent back her lunch tray. Pt ate a few bites of cookie so did not obtain her next CBG before this RN gave her a sandwich from L&D.
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
## 2018-01-11 NOTE — Progress Notes (Signed)
Post Partum Day 2  Subjective:  Cynthia Kent is a 27 y.o. G2P1001 3438w4d s/p rLTCS.  No acute events overnight.  Pt denies problems with ambulating, voiding or po intake, though she has not been ambulating very much.  She denies nausea or vomiting.  Pain is poorly controlled.  She has had flatus. She has had bowel movement.  Lochia Minimal.  Plan for birth control is undecided.  Method of Feeding: breast  Objective: BP 103/67 (BP Location: Left Arm)   Pulse 60   Temp 98.1 F (36.7 C) (Axillary)   Resp 18   Ht 5' 6.5" (1.689 m)   Wt 112.5 kg   LMP 04/19/2017 (Approximate)   SpO2 100%   BMI 39.43 kg/m   Physical Exam:  General: alert, cooperative and no distress Lochia:normal flow Chest: normal work of breathing Heart: regular rate Abdomen: soft, nontender Uterine Fundus: firm Incision: Dressing is clean, dry, and intact DVT Evaluation: No evidence of DVT seen on physical exam.   Recent Labs    01/09/18 1131 01/10/18 0600  HGB 11.2* 10.6*  HCT 33.8* 31.9*    Assessment/Plan:  ASSESSMENT: Cynthia Kent is a 27 y.o. G2P1001 4338w4d ppd #2 s/p rLTCS doing well.  Refusing lovenox. Encouraged frequent ambulation.  Plan for discharge tomorrow and Social Work consult Continue routine PP care   LOS: 2 days   Denzil HughesKelsey D Avia Merkley, DO 01/11/2018, 3:54 PM

## 2018-01-11 NOTE — Progress Notes (Signed)
Pt c/o "care this stay is not the same as it was with my last baby". This RN asked what it was she could have done better to help make her stay better. The pt stated "It wasn't you, you were fine, I would have told you. I'm that kind of patient". This RN thanked the pt for her straightforwardness and stated that this  Rn is the same way.

## 2018-01-12 ENCOUNTER — Other Ambulatory Visit: Payer: Self-pay

## 2018-01-12 MED ORDER — MEASLES, MUMPS & RUBELLA VAC ~~LOC~~ INJ
0.5000 mL | INJECTION | Freq: Once | SUBCUTANEOUS | Status: DC
  Filled 2018-01-12: qty 0.5

## 2018-01-12 MED ORDER — OXYCODONE HCL 5 MG PO TABS: 5.0000 mg | ORAL_TABLET | ORAL | 0 refills | Status: DC | PRN

## 2018-01-12 MED ORDER — IBUPROFEN 600 MG PO TABS: 600.0000 mg | ORAL_TABLET | Freq: Four times a day (QID) | ORAL | 0 refills | Status: DC | PRN
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
## 2018-01-12 NOTE — Lactation Note (Signed)
This note was copied from a baby's chart. Lactation Consultation Note:experienced BF mom. Asked NP about a pump because she is making too much milk and her breasts are hurting. Was offered pump yesterday but refused it then Reviewed setup, use and cleaning of pump pieces.   Pumped 60 ml off left breast. I fed him 15 ml of EBM by bottle. Had baby latched to right breast when I went into room. Is unsure of how long he has been nursing. Reports breast still feels full. Doing some hand expression but only obtained a few ml. Going to put baby back to breast but he is sleepy. Mom going to rest now and will pump again in a little bit. No questions at present. To call prn  Patient Name: Boy Asencion PartridgeDenise Kent OZDGU'YToday's Date: 01/12/2018 Reason for consult: Follow-up assessment   Maternal Data Formula Feeding for Exclusion: No Has patient been taught Hand Expression?: Yes Does the patient have breastfeeding experience prior to this delivery?: Yes  Feeding Feeding Type: Breast Fed  LATCH Score Latch: Grasps breast easily, tongue down, lips flanged, rhythmical sucking.  Audible Swallowing: A few with stimulation  Type of Nipple: Everted at rest and after stimulation  Comfort (Breast/Nipple): Filling, red/small blisters or bruises, mild/mod discomfort  Hold (Positioning): No assistance needed to correctly position infant at breast.  LATCH Score: 8  Interventions    Lactation Tools Discussed/Used WIC Program: Yes Pump Review: Setup, frequency, and cleaning Initiated by:: DW Date initiated:: 01/12/18   Consult Status Consult Status: Complete    Pamelia HoitWeeks, [name] 01/12/2018, 12:00 PM

## 2018-01-13 ENCOUNTER — Ambulatory Visit: Payer: Medicaid Other

## 2018-01-23 ENCOUNTER — Ambulatory Visit (INDEPENDENT_AMBULATORY_CARE_PROVIDER_SITE_OTHER): Payer: Medicaid Other | Admitting: Advanced Practice Midwife

## 2018-01-23 DIAGNOSIS — O9229 Other disorders of breast associated with pregnancy and the puerperium: Secondary | ICD-10-CM

## 2018-01-23 DIAGNOSIS — O9279 Other disorders of lactation: Secondary | ICD-10-CM

## 2018-01-23 DIAGNOSIS — N644 Mastodynia: Secondary | ICD-10-CM

## 2018-01-23 MED ORDER — OXYCODONE HCL 5 MG PO TABS: 5.0000 mg | ORAL_TABLET | ORAL | 0 refills | Status: DC | PRN

## 2018-01-23 MED ORDER — FLUCONAZOLE 150 MG PO TABS: ORAL_TABLET | ORAL | 0 refills | Status: DC

## 2018-01-23 MED ORDER — IBUPROFEN 800 MG PO TABS: 800.0000 mg | ORAL_TABLET | Freq: Three times a day (TID) | ORAL | 1 refills | Status: DC | PRN

## 2018-01-23 NOTE — Progress Notes (Signed)
Patient had c section 01-10-18, reports occasional sharp pains with her incision.

## 2018-01-23 NOTE — Patient Instructions (Addendum)
Incision Care, Adult An incision is a surgical cut that is made through your skin. Most incisions are closed after surgery. Your incision may be closed with stitches (sutures), staples, skin glue, or adhesive strips. You may need to return to your health care provider to have sutures or staples removed. This may occur several days to several weeks after your surgery. The incision needs to be cared for properly to prevent infection. How to care for your incision Incision care   Follow instructions from your health care provider about how to take care of your incision. Make sure you: ? Wash your hands with soap and water before you change the bandage (dressing). If soap and water are not available, use hand sanitizer. ? Change your dressing as told by your health care provider. ? Leave sutures, skin glue, or adhesive strips in place. These skin closures may need to stay in place for 2 weeks or longer. If adhesive strip edges start to loosen and curl up, you may trim the loose edges. Do not remove adhesive strips completely unless your health care provider tells you to do that.  Check your incision area every day for signs of infection. Check for: ? More redness, swelling, or pain. ? More fluid or blood. ? Warmth. ? Pus or a bad smell.  Ask your health care provider how to clean the incision. This may include: ? Using mild soap and water. ? Using a clean towel to pat the incision dry after cleaning it. ? Applying a cream or ointment. Do this only as told by your health care provider. ? Covering the incision with a clean dressing.  Ask your health care provider when you can leave the incision uncovered.  Do not take baths, swim, or use a hot tub until your health care provider approves. Ask your health care provider if you can take showers. You may only be allowed to take sponge baths for bathing. Medicines  If you were prescribed an antibiotic medicine, cream, or ointment, take or apply the  antibiotic as told by your health care provider. Do not stop taking or applying the antibiotic even if your condition improves.  Take over-the-counter and prescription medicines only as told by your health care provider. General instructions  Limit movement around your incision to improve healing. ? Avoid straining, lifting, or exercise for the first month, or for as long as told by your health care provider. ? Follow instructions from your health care provider about returning to your normal activities. ? Ask your health care provider what activities are safe.  Protect your incision from the sun when you are outside for the first 6 months, or for as long as told by your health care provider. Apply sunscreen around the scar or cover it up.  Keep all follow-up visits as told by your health care provider. This is important. Contact a health care provider if:  Your have more redness, swelling, or pain around the incision.  You have more fluid or blood coming from the incision.  Your incision feels warm to the touch.  You have pus or a bad smell coming from the incision.  You have a fever or shaking chills.  You are nauseous or you vomit.  You are dizzy.  Your sutures or staples come undone. Get help right away if:  You have a red streak coming from your incision.  Your incision bleeds through the dressing and the bleeding does not stop with gentle pressure.  The edges of   your incision open up and separate.  You have severe pain.  You have a rash.  You are confused.  You faint.  You have trouble breathing and a fast heartbeat. This information is not intended to replace advice given to you by your health care provider. Make sure you discuss any questions you have with your health care provider. Document Released: 10/30/2004 Document Revised: 12/19/2015 Document Reviewed: 10/29/2015 Elsevier Interactive Patient Education  2018 [employer].  Candidiasis and  Breastfeeding The Candida organism is a fungus that lives in our bodies. It produces yeast cells and is kept at healthy levels by the natural bacteria in our bodies. Candida lives in warm, dark, and moist places of the body, such as skin folds under the breast and wet nipples covered by bras or nursing bra pads. When your body's natural balance of bacteria is upset, Candidacan overgrow, causing an infection. This type of infection is called candidiasis. What increases the risk? You may be at higher risk for developing candidiasis if you or your baby has been taking antibiotic medicines, your nipples are cracked, or you are taking oral contraceptives or steroids (such as for asthma). What are the signs or symptoms?  Severe stinging or burning pain, which may be on the surface of the nipples or may be felt deep inside the breast.  Pain during, in between, or especially right after feedings.  Sharp, shooting pain that spreads (radiates) from the nipple into the breast or into the back or arm.  Nipples suddenly become sore after the first two weeks after you give birth.  Sensitive nipples that may have pain with even a light touch. Nipples may also be: ? Puffy. ? Weepy. ? Itchy. ? Blistering. ? Cracked. ? Scaly. ? Reddish. ? Shiny. ? Flaky. How is this diagnosed? The diagnosis is often made based on the symptoms. Microscopic evaluation of breast discharge or cultures may be needed. How is this treated? Yeast can be passed back and forth between a mother and her baby. The mother and baby may need treatment at the same time in order to clear up the infection, even if one does not have symptoms. Occasionally other family members (especially your sexual partner) may need to be treated at the same time. Treatment may involve:  Applying antifungal cream to your nipples after each feeding.  Washing your nipples with warm water before nursing.  Stopping nursing from the affected breast and using  a breast pump.  Keeping the affected breast empty of milk with nursing or with a breast pump.  Medicine. This may be given if your baby has thrush or diaper rash. If you are nursing and you have candidiasis, your baby should be treated for thrush even if you cannot see any white patches in the baby's mouth.  If your infection is more severe, you may be prescribed medicines by mouth.  Talk to your health care provider before starting treatment. It is important to begin treatment only after making sure other things are not the cause of the problem. Follow these instructions at home: Usually after 24-48 hours, you should feel some improvement. In some cases, symptoms may get worse before they get better.  Only take medicines as directed by your health care provider. Make sure to finish all your medicines.  Only take over-the-counter or prescription medicines for pain, discomfort, or fever as directed by your health care provider.  Give your child medicines as directed by your health care provider. Make sure your child finishes  them as directed by your health care provider.  Use creams or ointments as suggested by your health care provider.  Make sure your baby is seen and treated at the same time as you.  Wash your hands often. Wash them before and after nursing and changing your baby's diaper and after using the bathroom. Use hot, soapy water. Use soft towels or cloths to pat yourself dry.  Wash your baby's hands often, especially if he or she sucks on his or her fingers.  If your baby uses a pacifier, it should be boiled for 20 minutes a day and replaced every week.  Nurse more often but for shorter periods of time. Start nursing on the least sore side.  Wash your breast pump and all its parts thoroughly in a bleach solution. Boil all parts that touch the milk (except the rubber gaskets).  If nursing becomes too painful, you may want to pump your milk temporarily and feed it to your  baby. Do not save or freeze this milk because if given to the baby after treatment is completed, it could cause the infection to return.  Eat yogurt that has live active cultures and take oral acidophilus.  Air dry your nipples after nursing.  Change bra pads after each feeding.  Wear 100% cotton bras and wash them every day in hot water.  Wash any towels or clothing that comes in contact with the infected area in very hot water (above 122F [50C]).  Contact a health care provider if: Seek medical care if:  You or your baby are not getting better or are getting worse with the treatment.  Your breasts develop shooting pains, discomfort, itching, or burning after you take antibiotics.  Get help right away if: Seek immediate medical care if:  You have a fever or persistent symptoms for more than 2-3 days.  You have a fever and your symptoms suddenly get worse.  You develop swelling and severe pain in your breast.  You develop blisters on your breast.  You feel a lump in your breast, with or without pain.  Your nipple starts bleeding.  This information is not intended to replace advice given to you by your health care provider. Make sure you discuss any questions you have with your health care provider. Document Released: 08/07/2004 Document Revised: 09/18/2015 Document Reviewed: 10/04/2012 Elsevier Interactive Patient Education  [employer].

## 2018-01-23 NOTE — Progress Notes (Signed)
  GYNECOLOGY PROGRESS NOTE  History:  27 y.o. G2P1001 s/p RLTCS with failed ECV for breech presentation on 01/10/18, presents to [facility] office today for incision check visit. She reports sharp pain in her incision with movement and sharp shooting pain in both breasts while breastfeeding.  Honeycomb dressing is still in place.  She denies h/a, dizziness, shortness of breath, n/v, or fever/chills.    The following portions of the patient's history were reviewed and updated as appropriate: allergies, current medications, past family history, past medical history, past social history, past surgical history and problem list.   Review of Systems:  Pertinent items are noted in HPI.   Objective:  Physical Exam Blood pressure 112/78, pulse 71, weight 98.9 kg, last menstrual period 04/19/2017, not currently breastfeeding. VS reviewed, nursing note reviewed,  Constitutional: well developed, well nourished, no distress HEENT: normocephalic CV: normal rate Pulm/chest wall: normal effort Breast Exam: deferred Abdomen: soft, incision is well approximated, no erythema, edema, or exudate Neuro: alert and oriented x 3 Skin: warm, dry Psych: affect normal Pelvic exam: Deferred  Honeycomb dressing and steri strips removed at office visit today, pt tolerated well.  Assessment & Plan:  1. Postpartum examination following cesarean delivery --Pt doing well, after dressing removed, incision examined and is healing well without any evidence of infection.  Pt requests 800 of ibuprofen to take Q 8 instead of 600 Q 6 hours.  Add 10 more oxycodone for breakthrough pain.  Pt to f/u at 6 week visit. Plans IUD at that visit. --Desired VBAC but had RLTCS with breech position. Pt is doing well with need for repeat surgery.  She reports she has good support and denies any s/sx of depression. - ibuprofen (ADVIL,MOTRIN) 800 MG tablet; Take 1 tablet (800 mg total) by mouth every 8 (eight) hours as needed.  Dispense: 60  tablet; Refill: 1 - oxyCODONE (ROXICODONE) 5 MG immediate release tablet; Take 1 tablet (5 mg total) by mouth every 4 (four) hours as needed for severe pain.  Dispense: 10 tablet; Refill: 0  2. Pain of breast during during breastfeeding --Sharp shooting and burning pain during breastfeeding.  Infant not present to view latch, but may be latch issue. Some evidence of scaling at the nipple on left breast so will treat with Diflucan for possible yeast.  Recommend infant f/u with pediatrician (has appt today) for evaluation for thrush. --Pt to call lactation for appointment to work on infant latch as soon as possible. - fluconazole (DIFLUCAN) 150 MG tablet; Take one tablet today and one in 3 days  Dispense: 2 tablet; Refill: 0  Sharen Counter, CNM 11:31 AM

## 2018-01-27 ENCOUNTER — Telehealth: Payer: Self-pay

## 2018-01-27 NOTE — Telephone Encounter (Signed)
Home nurse Shelly called, and stated that pt scored 11 on edinburgh. Contacted pt and she stated that she has an appt today with [facility] and wants to start taking meds for depression again. Pt denies suicidal thoughts, advised scheduler would call for appt.

## 2018-01-30 ENCOUNTER — Ambulatory Visit (INDEPENDENT_AMBULATORY_CARE_PROVIDER_SITE_OTHER): Payer: Medicaid Other | Admitting: Advanced Practice Midwife

## 2018-01-30 VITALS — BP 116/78 | HR 96 | Wt 221.0 lb

## 2018-01-30 DIAGNOSIS — Z1389 Encounter for screening for other disorder: Secondary | ICD-10-CM | POA: Diagnosis not present

## 2018-01-30 DIAGNOSIS — O99345 Other mental disorders complicating the puerperium: Secondary | ICD-10-CM

## 2018-01-30 DIAGNOSIS — F53 Postpartum depression: Secondary | ICD-10-CM

## 2018-01-30 MED ORDER — ESCITALOPRAM OXALATE 10 MG PO TABS: 10.0000 mg | ORAL_TABLET | Freq: Every day | ORAL | 5 refills | Status: DC

## 2018-01-30 NOTE — Progress Notes (Signed)
Post Partum Exam  Cynthia Kent is a 27 y.o. G30P1001 female who presents for a postpartum visit. She is 3 weeks postpartum following a low cervical transverse Cesarean section. I have fully reviewed the prenatal and intrapartum course. The delivery was at 39.2 gestational weeks.  Anesthesia: spinal. Postpartum course has been doing well. Baby's course has been doing well. Baby is feeding by breast. Bleeding thin lochia. Bowel function is normal. Bladder function is normal. Patient is notsexually active. Contraception method is abstinence.  Postpartum depression screening:neg, score 9.  The following portions of the patient's history were reviewed and updated as appropriate: allergies, current medications, past family history, past medical history, past social history, past surgical history and problem list. Last pap smear done 06/2017 and was Normal  Review of Systems Pertinent items noted in HPI and remainder of comprehensive ROS otherwise negative.    Objective:  Last menstrual period 04/19/2017 VS reviewed, nursing note reviewed,  BP 116/78   Pulse 96   Wt 100.2 kg   LMP 04/19/2017 (Approximate)   BMI 35.14 kg/m  Constitutional: well developed, well nourished, no distress HEENT: normocephalic CV: normal rate Pulm/chest wall: normal effort Abdomen: soft Neuro: alert and oriented x 3 Skin: warm, dry, incision well approximated, no edema, erythema or exudate Psych: affect normal      Assessment/Plan:   1. Postpartum depression --Pt has behavioral health appt 02/10/18.  Was on Lexapro prepregnancy but chose not to take meds in pregnancy.   --Start medication now, low dose to start, f/u as scheduled. --Pt denies any thoughts of self harm or intent to harm others. --She has her own place but chooses now to stay with other family members and friends because she feels better when she is not alone.  Currently staying with her Aunt. - escitalopram (LEXAPRO) 10 MG tablet; Take 1  tablet (10 mg total) by mouth daily. Start with 1/2 tablet daily for 6 days, then 1 tablet daily.  Dispense: 30 tablet; Refill: 5  2. Postpartum examination following cesarean delivery --Incision healing well.  --F/U at next visit for PP visit and IUD placement for contraception.      Social/emotional concerns:  Improved score on depression scale today, see above for solutions.  Contraception: abstinence and plan for IUD  Follow up in: 2 week or as needed.   Sharen Counter, CNM 2:20 PM

## 2018-01-30 NOTE — Patient Instructions (Addendum)

## 2018-02-13 ENCOUNTER — Ambulatory Visit: Payer: Medicaid Other | Admitting: Advanced Practice Midwife

## 2018-02-20 ENCOUNTER — Ambulatory Visit: Payer: Medicaid Other | Admitting: Advanced Practice Midwife

## 2018-03-09 ENCOUNTER — Other Ambulatory Visit: Payer: Self-pay | Admitting: Advanced Practice Midwife

## 2018-03-09 ENCOUNTER — Telehealth: Payer: Self-pay

## 2018-03-09 NOTE — Telephone Encounter (Signed)
Consulted with CNM in office:  She has a couple options. She can use topical Miconazole on her nipple(s) but she needs to remove it with a cotton ball coated in either olive oil or coconut oil before she breastfeeds.   She can also try 1% gentian violet. It is also applied topically and is an old school remedy many people love. It just makes a huge mess. It will get all over her and baby but will not hurt anything.   Pt agrees and has no further questions.

## 2018-03-09 NOTE — Telephone Encounter (Signed)
Pt states her baby was diagnosed with thrush and is currently taking medication for it. She said it looks like she's developing yeast on her nipples so she is requesting an antibiotic and/or cream for her nipples as well.  Informed pt I will consult with the provider and call her back.

## 2018-03-15 ENCOUNTER — Ambulatory Visit (INDEPENDENT_AMBULATORY_CARE_PROVIDER_SITE_OTHER): Payer: Medicaid Other | Admitting: Advanced Practice Midwife

## 2018-03-15 ENCOUNTER — Encounter: Payer: Self-pay | Admitting: Advanced Practice Midwife

## 2018-03-15 VITALS — BP 114/81 | HR 79 | Wt 215.0 lb

## 2018-03-15 DIAGNOSIS — Z30430 Encounter for insertion of intrauterine contraceptive device: Secondary | ICD-10-CM | POA: Diagnosis not present

## 2018-03-15 DIAGNOSIS — Z975 Presence of (intrauterine) contraceptive device: Secondary | ICD-10-CM | POA: Insufficient documentation

## 2018-03-15 DIAGNOSIS — Z3202 Encounter for pregnancy test, result negative: Secondary | ICD-10-CM | POA: Diagnosis not present

## 2018-03-15 DIAGNOSIS — Z308 Encounter for other contraceptive management: Secondary | ICD-10-CM

## 2018-03-15 LAB — POCT URINE PREGNANCY: Preg Test, Ur: NEGATIVE

## 2018-03-15 MED ORDER — LEVONORGESTREL 20 MCG/24HR IU IUD
INTRAUTERINE_SYSTEM | Freq: Once | INTRAUTERINE | Status: AC
  Administered 2018-03-15: 1 via INTRAUTERINE

## 2018-03-15 NOTE — Progress Notes (Signed)
Post Partum Exam  Cynthia Kent is a 27 y.o. 602P1001 female who presents for a postpartum visit. She is 9 weeks postpartum following a low cervical transverse Cesarean section. I have fully reviewed the prenatal and intrapartum course. The delivery was at 39 gestational weeks.  Anesthesia: spinal. Postpartum course has been has been well. Baby's course has been well Baby is feeding by breast. Bleeding no bleeding. Bowel function is normal. Bladder function is normal. Patient is sexually active. Contraception method is none. Pt reports intercourse yesterday with withdrawal and unprotected intercourse 2 wks ago.   Postpartum depression screening:10  The following portions of the patient's history were reviewed and updated as appropriate: allergies, current medications, past family history, past medical history, past social history, past surgical history and problem list. Last pap smear done 07/12/2017 and was Normal  Review of Systems Pertinent items noted in HPI and remainder of comprehensive ROS otherwise negative.    Objective:  Blood pressure 114/81, pulse 79, weight 215 lb (97.5 kg), last menstrual period 04/19/2017, currently breastfeeding.  VS reviewed, nursing note reviewed,  Constitutional: well developed, well nourished, no distress HEENT: normocephalic CV: normal rate Pulm/chest wall: normal effort Breast Exam:  Deferred Abdomen: soft Neuro: alert and oriented x 3 Skin: warm, dry Psych: affect normal Pelvic exam: Cervix pink, visually closed, without lesion, scant white creamy discharge, vaginal walls and external genitalia normal  Results for orders placed or performed in visit on 03/15/18 (from the past 24 hour(s))  POCT urine pregnancy     Status: None   Collection Time: 03/15/18  3:30 PM  Result Value Ref Range   Preg Test, Ur Negative Negative    IUD Procedure Note Patient identified, informed consent performed.  Discussed risks of irregular bleeding, cramping,  infection, malpositioning or misplacement of the IUD outside the uterus which may require further procedures. Time out was performed.  Urine pregnancy test negative.  Speculum placed in the vagina.  Cervix visualized.  Cleaned with Betadine x 2.  Grasped anteriorly with a single tooth tenaculum.  Uterus sounded to 7.5 cm.  Mirena IUD placed per manufacturer's recommendations.  Strings trimmed to 3 cm. Tenaculum was removed, good hemostasis noted.  Patient tolerated procedure well.   Patient was given post-procedure instructions and the Mirena care card with expiration date.  Patient was also asked to check IUD strings periodically and follow up in 4-6 weeks for IUD check.      Assessment/Plan:   1. Encounter for other contraceptive management --Pt with intercourse yesterday but using withdrawal.  Pt is still breastfeeding, although not exclusively.  Pt has not had a period since her delivery. Given the low risks of pregnancy and higher risks of pt becoming pregnant while waiting for another visit, will place IUD today.   --Discussed with pt risks of IUD placement in early pregnancy, including miscarriage and pregnancy loss or preterm labor.  Pt states understanding.  --Pregnancy test negative today.   - POCT urine pregnancy  2. Encounter for IUD insertion --IUD placed without difficulty.  See procedure note above. - levonorgestrel (MIRENA) 20 MCG/24HR IUD  3. Postpartum examination following cesarean delivery --Pt doing well, reports good support.       Social/emotional concerns:  Doing well today, she reports she is adjusting well without depressive symptoms.  Contraception: IUD  Follow up in: 4 weeks for string check.    Sharen CounterLisa Leftwich-Kirby, CNM 4:55 PM

## 2018-03-15 NOTE — Patient Instructions (Signed)

## 2018-04-12 ENCOUNTER — Ambulatory Visit: Payer: Medicaid Other | Admitting: Family Medicine

## 2018-04-17 ENCOUNTER — Ambulatory Visit: Payer: Medicaid Other | Admitting: Advanced Practice Midwife

## 2018-04-17 NOTE — Progress Notes (Deleted)
  GYNECOLOGY CLINIC PROGRESS NOTE  History:  27 y.o. G2P1001 here today for today for IUD string check; Mirena IUD was placed  ***. No complaints about the Mirena, no concerning side effects.  The following portions of the patient's history were reviewed and updated as appropriate: allergies, current medications, past family history, past medical history, past social history, past surgical history and problem list. Last pap smear on *** was normal, *** HRHPV.  Review of Systems:  Pertinent items are noted in HPI.   Objective:  Physical Exam currently breastfeeding. Gen: NAD Abd: Soft, nontender and nondistended Pelvic: Normal appearing external genitalia; normal appearing vaginal mucosa and cervix.  IUD strings visualized, about *** cm in length outside cervix.   Assessment & Plan:  Normal IUD check. Patient to keep IUD in place for five years; can come in for removal if she desires pregnancy within the next five years. Routine preventative health maintenance measures emphasized.  Sharen CounterLisa Leftwich-Kirby, CNM 11:06 AM

## 2018-05-02 ENCOUNTER — Ambulatory Visit: Payer: Medicaid Other | Admitting: Advanced Practice Midwife

## 2018-05-08 ENCOUNTER — Ambulatory Visit (INDEPENDENT_AMBULATORY_CARE_PROVIDER_SITE_OTHER): Payer: Medicaid Other | Admitting: Advanced Practice Midwife

## 2018-05-08 ENCOUNTER — Encounter: Payer: Self-pay | Admitting: Advanced Practice Midwife

## 2018-05-08 VITALS — BP 118/78 | HR 79 | Wt 204.2 lb

## 2018-05-08 DIAGNOSIS — R51 Headache: Secondary | ICD-10-CM

## 2018-05-08 DIAGNOSIS — R519 Headache, unspecified: Secondary | ICD-10-CM | POA: Insufficient documentation

## 2018-05-08 DIAGNOSIS — Z975 Presence of (intrauterine) contraceptive device: Secondary | ICD-10-CM

## 2018-05-08 DIAGNOSIS — Z30431 Encounter for routine checking of intrauterine contraceptive device: Secondary | ICD-10-CM

## 2018-05-08 DIAGNOSIS — N921 Excessive and frequent menstruation with irregular cycle: Secondary | ICD-10-CM

## 2018-05-08 DIAGNOSIS — G8929 Other chronic pain: Secondary | ICD-10-CM

## 2018-05-08 DIAGNOSIS — T839XXA Unspecified complication of genitourinary prosthetic device, implant and graft, initial encounter: Secondary | ICD-10-CM

## 2018-05-08 NOTE — Patient Instructions (Signed)
Intrauterine Device Insertion, Care After    This sheet gives you information about how to care for yourself after your procedure. Your health care provider may also give you more specific instructions. If you have problems or questions, contact your health care provider.  What can I expect after the procedure?  After the procedure, it is common to have:  · Cramps and pain in the abdomen.  · Light bleeding (spotting) or heavier bleeding that is like your menstrual period. This may last for up to a few days.  · Lower back pain.  · Dizziness.  · Headaches.  · Nausea.  Follow these instructions at home:  · Before resuming sexual activity, check to make sure that you can feel the IUD string(s). You should be able to feel the end of the string(s) below the opening of your cervix. If your IUD string is in place, you may resume sexual activity.  ? If you had a hormonal IUD inserted more than 7 days after your most recent period started, you will need to use a backup method of birth control for 7 days after IUD insertion. Ask your health care provider whether this applies to you.  · Continue to check that the IUD is still in place by feeling for the string(s) after every menstrual period, or once a month.  · Take over-the-counter and prescription medicines only as told by your health care provider.  · Do not drive or use heavy machinery while taking prescription pain medicine.  · Keep all follow-up visits as told by your health care provider. This is important.  Contact a health care provider if:  · You have bleeding that is heavier or lasts longer than a normal menstrual cycle.  · You have a fever.  · You have cramps or abdominal pain that get worse or do not get better with medicine.  · You develop abdominal pain that is new or is not in the same area of earlier cramping and pain.  · You feel lightheaded or weak.  · You have abnormal or bad-smelling discharge from your vagina.  · You have pain during sexual  activity.  · You have any of the following problems with your IUD string(s):  ? The string bothers or hurts you or your sexual partner.  ? You cannot feel the string.  ? The string has gotten longer.  · You can feel the IUD in your vagina.  · You think you may be pregnant, or you miss your menstrual period.  · You think you may have an STI (sexually transmitted infection).  Get help right away if:  · You have flu-like symptoms.  · You have a fever and chills.  · You can feel that your IUD has slipped out of place.  Summary  · After the procedure, it is common to have cramps and pain in the abdomen. It is also common to have light bleeding (spotting) or heavier bleeding that is like your menstrual period.  · Continue to check that the IUD is still in place by feeling for the string(s) after every menstrual period, or once a month.  · Keep all follow-up visits as told by your health care provider. This is important.  · Contact your health care provider if you have problems with your IUD string(s), such as the string getting longer or bothering you or your sexual partner.  This information is not intended to replace advice given to you by your health care provider. Make   sure you discuss any questions you have with your health care provider.  Document Released: 12/09/2010 Document Revised: 03/03/2016 Document Reviewed: 03/03/2016  Elsevier Interactive Patient Education © 2019 Elsevier Inc.

## 2018-05-08 NOTE — Progress Notes (Signed)
Pt is in the office for IUD string check. Pt complains of headaches, spotting, and nausea since IUD was placed.
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
## 2018-05-16 ENCOUNTER — Ambulatory Visit (HOSPITAL_COMMUNITY)
Admission: RE | Admit: 2018-05-16 | Discharge: 2018-05-16 | Disposition: A | Discharge status: Home / Self Care | Payer: Medicaid Other | Source: Ambulatory Visit | Attending: Advanced Practice Midwife | Admitting: Advanced Practice Midwife

## 2018-05-16 DIAGNOSIS — T839XXA Unspecified complication of genitourinary prosthetic device, implant and graft, initial encounter: Secondary | ICD-10-CM | POA: Insufficient documentation

## 2018-05-30 ENCOUNTER — Telehealth: Payer: Self-pay | Admitting: Advanced Practice Midwife

## 2018-05-30 NOTE — Telephone Encounter (Signed)
Pt outpatient US shows IUD is in normal location, no complications.  Message sent to front desk to schedule follow up visit with Cynthia Kent to discuss contraception.

## 2018-06-03 ENCOUNTER — Other Ambulatory Visit: Payer: Self-pay | Admitting: Advanced Practice Midwife

## 2018-06-03 ENCOUNTER — Encounter: Payer: Self-pay | Admitting: Advanced Practice Midwife

## 2018-06-03 MED ORDER — ONDANSETRON HCL 4 MG PO TABS: 4.0000 mg | ORAL_TABLET | Freq: Three times a day (TID) | ORAL | 0 refills | Status: DC | PRN

## 2018-06-09 ENCOUNTER — Encounter: Payer: Self-pay | Admitting: Physician Assistant

## 2018-06-09 ENCOUNTER — Ambulatory Visit (INDEPENDENT_AMBULATORY_CARE_PROVIDER_SITE_OTHER): Payer: Medicaid Other | Admitting: Physician Assistant

## 2018-06-09 ENCOUNTER — Institutional Professional Consult (permissible substitution): Payer: Medicaid Other | Admitting: Physician Assistant

## 2018-06-09 VITALS — BP 102/66 | HR 72 | Wt 203.6 lb

## 2018-06-09 DIAGNOSIS — G43009 Migraine without aura, not intractable, without status migrainosus: Secondary | ICD-10-CM | POA: Diagnosis not present

## 2018-06-09 MED ORDER — AMITRIPTYLINE HCL 25 MG PO TABS: 25.0000 mg | ORAL_TABLET | Freq: Every day | ORAL | 2 refills | Status: DC

## 2018-06-09 MED ORDER — SUMATRIPTAN SUCCINATE 100 MG PO TABS: 100.0000 mg | ORAL_TABLET | Freq: Once | ORAL | 1 refills | Status: DC | PRN

## 2018-06-09 NOTE — Patient Instructions (Signed)
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
## 2018-06-19 ENCOUNTER — Telehealth: Payer: Self-pay

## 2018-06-19 NOTE — Telephone Encounter (Signed)
Patient notified of RX

## 2018-08-11 ENCOUNTER — Other Ambulatory Visit: Payer: Self-pay

## 2018-08-11 ENCOUNTER — Ambulatory Visit (INDEPENDENT_AMBULATORY_CARE_PROVIDER_SITE_OTHER): Payer: Medicaid Other | Admitting: Physician Assistant

## 2018-08-11 ENCOUNTER — Encounter: Payer: Self-pay | Admitting: Physician Assistant

## 2018-08-11 DIAGNOSIS — G43009 Migraine without aura, not intractable, without status migrainosus: Secondary | ICD-10-CM | POA: Diagnosis not present

## 2018-08-11 MED ORDER — AMITRIPTYLINE HCL 25 MG PO TABS: 25.0000 mg | ORAL_TABLET | Freq: Every day | ORAL | 2 refills | Status: DC
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
## 2018-08-11 NOTE — Patient Instructions (Signed)

## 2018-09-25 ENCOUNTER — Ambulatory Visit: Payer: Medicaid Other | Admitting: Advanced Practice Midwife

## 2018-10-03 ENCOUNTER — Ambulatory Visit (INDEPENDENT_AMBULATORY_CARE_PROVIDER_SITE_OTHER): Payer: Medicaid Other | Admitting: Advanced Practice Midwife

## 2018-10-03 ENCOUNTER — Encounter: Payer: Self-pay | Admitting: Advanced Practice Midwife

## 2018-10-03 DIAGNOSIS — T839XXA Unspecified complication of genitourinary prosthetic device, implant and graft, initial encounter: Secondary | ICD-10-CM

## 2018-10-03 DIAGNOSIS — R102 Pelvic and perineal pain: Secondary | ICD-10-CM | POA: Diagnosis not present

## 2018-10-03 DIAGNOSIS — Z3009 Encounter for other general counseling and advice on contraception: Secondary | ICD-10-CM | POA: Diagnosis not present

## 2018-10-03 DIAGNOSIS — R110 Nausea: Secondary | ICD-10-CM

## 2018-10-03 DIAGNOSIS — G8929 Other chronic pain: Secondary | ICD-10-CM
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
## 2018-10-03 NOTE — Progress Notes (Signed)
I connected with Cynthia Kent on 10/03/18 at  3:10 PM EDT by a telephone and verified that I am speaking with the correct person using two identifiers.  Pt c/o intermittent pelvic and abdominal pain every since the Mirena was placed in Nov.  Pt wants IUD removed and would rather practice safe sex.

## 2018-10-17 ENCOUNTER — Ambulatory Visit: Payer: Medicaid Other | Admitting: Advanced Practice Midwife

## 2018-11-03 ENCOUNTER — Encounter: Payer: Self-pay | Admitting: Physician Assistant

## 2018-11-03 ENCOUNTER — Other Ambulatory Visit: Payer: Self-pay

## 2018-11-03 ENCOUNTER — Ambulatory Visit (INDEPENDENT_AMBULATORY_CARE_PROVIDER_SITE_OTHER): Payer: Medicaid Other | Admitting: Physician Assistant

## 2018-11-03 DIAGNOSIS — G43009 Migraine without aura, not intractable, without status migrainosus: Secondary | ICD-10-CM

## 2018-11-03 NOTE — Patient Instructions (Signed)
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
## 2018-11-08 ENCOUNTER — Ambulatory Visit: Payer: Medicaid Other | Admitting: Advanced Practice Midwife

## 2018-11-17 ENCOUNTER — Ambulatory Visit (INDEPENDENT_AMBULATORY_CARE_PROVIDER_SITE_OTHER): Payer: Medicaid Other | Admitting: Family Medicine

## 2018-11-17 ENCOUNTER — Encounter: Payer: Self-pay | Admitting: Family Medicine

## 2018-11-17 ENCOUNTER — Other Ambulatory Visit: Payer: Self-pay

## 2018-11-17 VITALS — BP 104/60 | HR 98 | Ht 66.0 in | Wt 201.4 lb

## 2018-11-17 DIAGNOSIS — L989 Disorder of the skin and subcutaneous tissue, unspecified: Secondary | ICD-10-CM | POA: Insufficient documentation

## 2018-11-17 DIAGNOSIS — F909 Attention-deficit hyperactivity disorder, unspecified type: Secondary | ICD-10-CM | POA: Diagnosis present

## 2018-11-17 DIAGNOSIS — N644 Mastodynia: Secondary | ICD-10-CM | POA: Diagnosis not present

## 2018-11-17 MED ORDER — AMPHETAMINE-DEXTROAMPHET ER 20 MG PO CP24: 20.0000 mg | ORAL_CAPSULE | Freq: Every day | ORAL | 0 refills | Status: DC

## 2018-11-17 NOTE — Progress Notes (Signed)
   Subjective:    Patient ID: Cynthia Kent, female    DOB: [birth year], 28 y.o.   MRN: [id number]   CC: Breast tenderness, breakdown skin  HPI:  Breast tenderness: Patient reports occasional breast tenderness.  She states she has children who are constantly pulling on her accidentally hitting her in her chest, her step on her when she is playing with them.  She feels occasional bumps to her breasts that are tender to palpation.  Otherwise, she denies fevers, chest pain, nausea, vomiting, and abdominal pain.  Skin breakout: Patient needs to see dermatologist for deep skin lesions which most closely mimic cystic type acne versus hidradenitis suppurativa.  Patient states she used to be on "a pill and a cream" but cannot remember what they were called.  Patient used to be seen by Dr. Valli Glance (dermatologist) in [facility]. The lesions the patient gets to her underarms and inner thigh are very painful and leave dark scars.  She is interested in restarting these medications versus being sent back to dermatology.  Review of Systems: See HPI   Objective:  BP 104/60   Pulse 98   Ht 5\' 6"  (1.676 m)   Wt 201 lb 6 oz (91.3 kg)   SpO2 94%   BMI 32.50 kg/m  Vitals and nursing note reviewed  General: well nourished, in no acute distress Neck: supple, non-tender, without lymphadenopathy Cardiac: RRR, clear S1 and S2, no murmurs appreciated Respiratory: CTA bilaterally, no increased work of breathing Abdomen: soft, nontender, nondistended, no masses or organomegaly. Bowel sounds present Skin: Tender mobile mass palpated to underarm region, possibly due to hidradenitis suppurativa.  Dark scars on the skin can be seen where previous lesions have occurred. Neuro: alert and oriented, no focal deficits   Assessment & Plan:   Breast pain Patient reassured that breast pain is most likely due to fibroadenomatous changes.  Given instructions to decrease caffeine intake and to stay hydrated.  Also  told that vitamin E topical to the skin can also help to decrease breast tenderness.  Attention deficit hyperactivity disorder (ADHD) Patient has a history of ADHD, which she has been trying to treat without any medications.  Patient was previously prescribed Adderall 20 mg daily but discontinued this medication while she was pregnant.  She states she is having more more difficulty working due to her ADHD. -We will restart Adderall 20 mg  Skin lesions Patient with skin lesions mimicking hidradenitis suppurativa.  Used to be seen by Dr. Valli Glance in [facility] [phone]).   -Will reach out to dermatologist to find out patient's prior regimen.  If previous regimen cannot be repeated will start on doxycycline with Hibiclens or refer to dermatology.   Return if symptoms worsen or fail to improve.  Dr. Milus Banister [facility], PGY-2

## 2018-11-17 NOTE — Assessment & Plan Note (Signed)
Patient reassured that breast pain is most likely due to fibroadenomatous changes.  Given instructions to decrease caffeine intake and to stay hydrated.  Also told that vitamin E topical to the skin can also help to decrease breast tenderness.

## 2018-11-17 NOTE — Assessment & Plan Note (Signed)
Patient has a history of ADHD, which she has been trying to treat without any medications.  Patient was previously prescribed Adderall 20 mg daily but discontinued this medication while she was pregnant.  She states she is having more more difficulty working due to her ADHD. -We will restart Adderall 20 mg

## 2018-11-17 NOTE — Assessment & Plan Note (Addendum)
Patient with skin lesions mimicking hidradenitis suppurativa.  Used to be seen by Dr. Valli Glance in [facility] [phone]).   -Will reach out to dermatologist to find out patient's prior regimen.  If previous regimen cannot be repeated will start on doxycycline with Hibiclens or refer to dermatology.

## 2018-11-17 NOTE — Patient Instructions (Addendum)
Thank you for coming in to see [place] today! Please see below to review our plan for today's visit:  I will contact your dermatologist for your regimen and send it to the pharmacy. If we cannot figure out the regimen we will send you to the dermatologist.   Please call the clinic at [phone] if your symptoms worsen or you have any concerns. It was our pleasure to serve you!    Dr. Milus Banister [facility]    Breast Tenderness Breast tenderness is a common problem for women of all ages. Breast tenderness may cause mild discomfort to severe pain. The pain usually comes and goes in association with your menstrual cycle, but it can be constant. Breast tenderness has many possible causes, including hormone changes and some medicines. Your health care provider may order tests, such as a mammogram or an ultrasound, to check for any unusual findings. Having breast tenderness usually does not mean that you have breast cancer. Follow these instructions at home: Sometimes, reassurance that you do not have breast cancer is all that is needed. In general, follow these home care instructions: Managing pain and discomfort   If directed, apply ice to the area: ? Put ice in a plastic bag. ? Place a towel between your skin and the bag. ? Leave the ice on for 20 minutes, 2-3 times a day.  Make sure you are wearing a supportive bra, especially during exercise. You may also want to wear a supportive bra while sleeping if your breasts are very tender. Medicines  Take over-the-counter and prescription medicines only as told by your health care provider. If the cause of your pain is infection, you may be prescribed an antibiotic medicine.  If you were prescribed an antibiotic, take it as told by your health care provider. Do not stop taking the antibiotic even if you start to feel better. General instructions   Your health care provider may recommend that you reduce the amount of fat  in your diet. You can do this by: ? Limiting fried foods. ? Cooking foods using methods, such as baking, boiling, grilling, and broiling.  Decrease the amount of caffeine in your diet. You can do this by drinking more water and choosing caffeine-free options.  Keep a log of the days and times when your breasts are most tender.  Ask your health care provider how to do breast exams at home. This will help you notice if you have an unusual growth or lump. Contact a health care provider if:  Any part of your breast is hard, red, and hot to the touch. This may be a sign of infection.  You are not breastfeeding and you have fluid, especially blood or pus, coming out of your nipples.  You have a fever.  You have a new or painful lump in your breast that remains after your menstrual period ends.  Your pain does not improve or it gets worse.  Your pain is interfering with your daily activities.

## 2018-11-21 ENCOUNTER — Other Ambulatory Visit: Payer: Self-pay

## 2018-11-21 ENCOUNTER — Ambulatory Visit: Payer: Medicaid Other | Admitting: Advanced Practice Midwife

## 2018-11-21 ENCOUNTER — Other Ambulatory Visit (HOSPITAL_COMMUNITY)
Admission: RE | Admit: 2018-11-21 | Discharge: 2018-11-21 | Disposition: A | Discharge status: Home / Self Care | Payer: Medicaid Other | Source: Ambulatory Visit | Attending: Advanced Practice Midwife | Admitting: Advanced Practice Midwife

## 2018-11-21 VITALS — BP 132/85 | HR 97 | Wt 201.0 lb

## 2018-11-21 DIAGNOSIS — L732 Hidradenitis suppurativa: Secondary | ICD-10-CM | POA: Diagnosis not present

## 2018-11-21 DIAGNOSIS — R102 Pelvic and perineal pain: Secondary | ICD-10-CM | POA: Diagnosis not present

## 2018-11-21 DIAGNOSIS — N930 Postcoital and contact bleeding: Secondary | ICD-10-CM

## 2018-11-21 DIAGNOSIS — N898 Other specified noninflammatory disorders of vagina: Secondary | ICD-10-CM | POA: Diagnosis present

## 2018-11-21 DIAGNOSIS — Z975 Presence of (intrauterine) contraceptive device: Secondary | ICD-10-CM

## 2018-11-21 DIAGNOSIS — Z3202 Encounter for pregnancy test, result negative: Secondary | ICD-10-CM | POA: Diagnosis not present

## 2018-11-21 LAB — POCT URINALYSIS DIPSTICK
Bilirubin, UA: NEGATIVE
Blood, UA: NEGATIVE
Glucose, UA: NEGATIVE
Ketones, UA: NEGATIVE
Leukocytes, UA: NEGATIVE
Nitrite, UA: NEGATIVE
Protein, UA: NEGATIVE
Spec Grav, UA: 1.015 (ref 1.010–1.025)
Urobilinogen, UA: 0.2 E.U./dL
pH, UA: 5 (ref 5.0–8.0)

## 2018-11-21 LAB — POCT URINE PREGNANCY: Preg Test, Ur: NEGATIVE

## 2018-11-21 MED ORDER — IBUPROFEN 800 MG PO TABS: 800.0000 mg | ORAL_TABLET | Freq: Three times a day (TID) | ORAL | 1 refills | Status: DC | PRN

## 2018-11-21 NOTE — Progress Notes (Signed)
Subjective:     Patient ID: Cynthia Kent, female   DOB: [birth year], 28 y.o.   MRN: [id number]  HPI: Here for abd pain and post-coital spotting. Has Liletta IUD in place x one year and in considering removal. Does not desire pregnancy at this time. Has Hx ovarian cysts and states pain is similar. Also C/O chronic skin lesions in axillae, groin and breasts. Requesting Tx. Was seeing a Dermatologist who is not longer in practice. States PCP suggested she go back to a Dermatologist, but didn't make a referral.    Had low abd pain and spotting for a few months after IUD placement. Was evaluated January 2020 and had US showing IUD in proper position. Sx improved for several months, but have started again. Ba dpain well-controlled w/ IBU. Periods are very light since IUD placed.   She states she is in a mutually monogamous relationship.  Review of Systems  Constitutional: Negative for appetite change, chills and fever.  Gastrointestinal: Positive for abdominal pain and nausea. Negative for constipation, diarrhea and vomiting.  Genitourinary: Positive for pelvic pain and vaginal bleeding. Negative for difficulty urinating, dyspareunia, dysuria, flank pain, frequency, hematuria, menstrual problem, vaginal discharge and vaginal pain.  Musculoskeletal: Negative for back pain.  Skin:       Chronic non-painful bumps and sores in axillae, groin, inner thighs and under breasts bilat. Rare drainage or bleeding.   Neurological: Negative for dizziness.       Objective:   Physical Exam Exam conducted with a chaperone present.  Constitutional:      General: She is not in acute distress.    Appearance: Normal appearance. She is obese. She is not ill-appearing or toxic-appearing.  HENT:     Head: Normocephalic.  Cardiovascular:     Rate and Rhythm: Normal rate.  Pulmonary:     Effort: Pulmonary effort is normal.  Abdominal:     General: Abdomen is flat. There is no distension.     Palpations:  Abdomen is soft. There is no mass.     Tenderness: There is no abdominal tenderness. There is no right CVA tenderness, left CVA tenderness, guarding or rebound.  Genitourinary:    General: Normal vulva.     Pubic Area: No rash.      Labia:        Right: No rash, tenderness, lesion or injury.        Left: No rash, tenderness, lesion or injury.      Vagina: Vaginal discharge (moderate amount of thin, whote, malodorous discharge. ) present. No erythema, tenderness, bleeding or lesions.     Cervix: No cervical motion tenderness, friability, lesion, erythema or cervical bleeding.     Uterus: Normal. Not enlarged and not tender.      Adnexa:        Right: Mass, tenderness and fullness present.        Left: Mass, tenderness and fullness present.      Lymphadenopathy:     Lower Body: No right inguinal adenopathy. No left inguinal adenopathy.  Skin:    General: Skin is warm and dry.     Coloration: Skin is not pale.  Neurological:     Mental Status: She is alert and oriented to person, place, and time.  Psychiatric:        Mood and Affect: Mood normal.       Assessment:     1. Pelvic pain in female  - ibuprofen (ADVIL) 800 MG tablet; Take 1 tablet (800 mg  total) by mouth every 8 (eight) hours as needed.  Dispense: 60 tablet; Refill: 1 - US PELVIS TRANSVAGINAL NON-OB (TV ONLY); Future - US PELVIS (TRANSABDOMINAL ONLY); Future - Cervicovaginal ancillary only( [place]) - POCT urine pregnancy - POCT Urinalysis Dipstick  3. IUD (intrauterine device) in place  - ibuprofen (ADVIL) 800 MG tablet; Take 1 tablet (800 mg total) by mouth every 8 (eight) hours as needed.  Dispense: 60 tablet; Refill: 1 - US PELVIS TRANSVAGINAL NON-OB (TV ONLY); Future - US PELVIS (TRANSABDOMINAL ONLY); Future  4. Postcoital bleeding  - ibuprofen (ADVIL) 800 MG tablet; Take 1 tablet (800 mg total) by mouth every 8 (eight) hours as needed.  Dispense: 60 tablet; Refill: 1 - US PELVIS TRANSVAGINAL NON-OB  (TV ONLY); Future - US PELVIS (TRANSABDOMINAL ONLY); Future  5. Hidradenitis suppurativa  - Ambulatory referral to Dermatology  6. Vaginal discharge  - Cervicovaginal ancillary only( )     Plan:     Will continue IUD for Contraception for now  F/U in 4 weeks to discuss results, course of Sx.      Katrinka BlazingSmith, [place], [place] 11/21/2018 4:31 PM

## 2018-11-21 NOTE — Patient Instructions (Signed)
Contraception Choices Contraception, also called birth control, refers to methods or devices that prevent pregnancy. Hormonal methods Contraceptive implant  A contraceptive implant is a thin, plastic tube that contains a hormone. It is inserted into the upper part of the arm. It can remain in place for up to 3 years. Progestin-only injections Progestin-only injections are injections of progestin, a synthetic form of the hormone progesterone. They are given every 3 months by a health care provider. Birth control pills  Birth control pills are pills that contain hormones that prevent pregnancy. They must be taken once a day, preferably at the same time each day. Birth control patch  The birth control patch contains hormones that prevent pregnancy. It is placed on the skin and must be changed once a week for three weeks and removed on the fourth week. A prescription is needed to use this method of contraception. Vaginal ring  A vaginal ring contains hormones that prevent pregnancy. It is placed in the vagina for three weeks and removed on the fourth week. After that, the process is repeated with a new ring. A prescription is needed to use this method of contraception. Emergency contraceptive Emergency contraceptives prevent pregnancy after unprotected sex. They come in pill form and can be taken up to 5 days after sex. They work best the sooner they are taken after having sex. Most emergency contraceptives are available without a prescription. This method should not be used as your only form of birth control. Barrier methods Female condom  A female condom is a thin sheath that is worn over the penis during sex. Condoms keep sperm from going inside a woman's body. They can be used with a spermicide to increase their effectiveness. They should be disposed after a single use. Female condom  A female condom is a soft, loose-fitting sheath that is put into the vagina before sex. The condom keeps sperm  from going inside a woman's body. They should be disposed after a single use. Diaphragm  A diaphragm is a soft, dome-shaped barrier. It is inserted into the vagina before sex, along with a spermicide. The diaphragm blocks sperm from entering the uterus, and the spermicide kills sperm. A diaphragm should be left in the vagina for 6-8 hours after sex and removed within 24 hours. A diaphragm is prescribed and fitted by a health care provider. A diaphragm should be replaced every 1-2 years, after giving birth, after gaining more than 15 lb (6.8 kg), and after pelvic surgery. Cervical cap  A cervical cap is a round, soft latex or plastic cup that fits over the cervix. It is inserted into the vagina before sex, along with spermicide. It blocks sperm from entering the uterus. The cap should be left in place for 6-8 hours after sex and removed within 48 hours. A cervical cap must be prescribed and fitted by a health care provider. It should be replaced every 2 years. Sponge  A sponge is a soft, circular piece of polyurethane foam with spermicide on it. The sponge helps block sperm from entering the uterus, and the spermicide kills sperm. To use it, you make it wet and then insert it into the vagina. It should be inserted before sex, left in for at least 6 hours after sex, and removed and thrown away within 30 hours. Spermicides Spermicides are chemicals that kill or block sperm from entering the cervix and uterus. They can come as a cream, jelly, suppository, foam, or tablet. A spermicide should be inserted into the   vagina with an applicator at least 10-15 minutes before sex to allow time for it to work. The process must be repeated every time you have sex. Spermicides do not require a prescription. Intrauterine contraception Intrauterine device (IUD) An IUD is a T-shaped device that is put in a woman's uterus. There are two types:  Hormone IUD.This type contains progestin, a synthetic form of the hormone  progesterone. This type can stay in place for 3-5 years.  Copper IUD.This type is wrapped in copper wire. It can stay in place for 10 years.  Permanent methods of contraception Female tubal ligation In this method, a woman's fallopian tubes are sealed, tied, or blocked during surgery to prevent eggs from traveling to the uterus. Hysteroscopic sterilization In this method, a small, flexible insert is placed into each fallopian tube. The inserts cause scar tissue to form in the fallopian tubes and block them, so sperm cannot reach an egg. The procedure takes about 3 months to be effective. Another form of birth control must be used during those 3 months. Female sterilization This is a procedure to tie off the tubes that carry sperm (vasectomy). After the procedure, the man can still ejaculate fluid (semen). Natural planning methods Natural family planning In this method, a couple does not have sex on days when the woman could become pregnant. Calendar method This means keeping track of the length of each menstrual cycle, identifying the days when pregnancy can happen, and not having sex on those days. Ovulation method In this method, a couple avoids sex during ovulation. Symptothermal method This method involves not having sex during ovulation. The woman typically checks for ovulation by watching changes in her temperature and in the consistency of cervical mucus. Post-ovulation method In this method, a couple waits to have sex until after ovulation. Summary  Contraception, also called birth control, means methods or devices that prevent pregnancy.  Hormonal methods of contraception include implants, injections, pills, patches, vaginal rings, and emergency contraceptives.  Barrier methods of contraception can include female condoms, female condoms, diaphragms, cervical caps, sponges, and spermicides.  There are two types of IUDs (intrauterine devices). An IUD can be put in a woman's uterus to  prevent pregnancy for 3-5 years.  Permanent sterilization can be done through a procedure for males, females, or both.  Natural family planning methods involve not having sex on days when the woman could become pregnant. This information is not intended to replace advice given to you by your health care provider. Make sure you discuss any questions you have with your health care provider. Document Released: 04/12/2005 Document Revised: 04/14/2017 Document Reviewed: 05/15/2016 Elsevier Patient Education  2020 Elsevier Inc.  

## 2018-11-24 ENCOUNTER — Encounter: Payer: Self-pay | Admitting: Family Medicine

## 2018-11-24 ENCOUNTER — Other Ambulatory Visit: Payer: Self-pay | Admitting: Family Medicine

## 2018-11-24 DIAGNOSIS — B9689 Other specified bacterial agents as the cause of diseases classified elsewhere: Secondary | ICD-10-CM

## 2018-11-24 LAB — CERVICOVAGINAL ANCILLARY ONLY
Bacterial vaginitis: POSITIVE — AB
Candida vaginitis: NEGATIVE
Chlamydia: NEGATIVE
Neisseria Gonorrhea: NEGATIVE
Trichomonas: NEGATIVE

## 2018-11-24 MED ORDER — METRONIDAZOLE 500 MG PO TABS: 500.0000 mg | ORAL_TABLET | Freq: Two times a day (BID) | ORAL | 0 refills | Status: DC

## 2018-12-05 ENCOUNTER — Ambulatory Visit (HOSPITAL_COMMUNITY): Admission: RE | Admit: 2018-12-05 | Payer: Medicaid Other | Source: Ambulatory Visit

## 2018-12-06 ENCOUNTER — Other Ambulatory Visit: Payer: Self-pay

## 2018-12-06 ENCOUNTER — Encounter (HOSPITAL_COMMUNITY): Payer: Self-pay

## 2018-12-06 ENCOUNTER — Ambulatory Visit (HOSPITAL_COMMUNITY)
Admission: RE | Admit: 2018-12-06 | Discharge: 2018-12-06 | Disposition: A | Discharge status: Home / Self Care | Payer: Medicaid Other | Source: Ambulatory Visit | Attending: Advanced Practice Midwife | Admitting: Advanced Practice Midwife

## 2018-12-06 DIAGNOSIS — Z975 Presence of (intrauterine) contraceptive device: Secondary | ICD-10-CM

## 2018-12-06 DIAGNOSIS — N930 Postcoital and contact bleeding: Secondary | ICD-10-CM

## 2018-12-06 DIAGNOSIS — R102 Pelvic and perineal pain: Secondary | ICD-10-CM

## 2018-12-11 ENCOUNTER — Other Ambulatory Visit: Payer: Self-pay | Admitting: Advanced Practice Midwife

## 2018-12-11 DIAGNOSIS — Z975 Presence of (intrauterine) contraceptive device: Secondary | ICD-10-CM

## 2018-12-11 DIAGNOSIS — R102 Pelvic and perineal pain: Secondary | ICD-10-CM

## 2018-12-11 DIAGNOSIS — N930 Postcoital and contact bleeding: Secondary | ICD-10-CM

## 2018-12-14 ENCOUNTER — Ambulatory Visit (HOSPITAL_BASED_OUTPATIENT_CLINIC_OR_DEPARTMENT_OTHER)
Admission: RE | Admit: 2018-12-14 | Discharge: 2018-12-14 | Disposition: A | Discharge status: Home / Self Care | Payer: Medicaid Other | Source: Ambulatory Visit | Attending: Advanced Practice Midwife | Admitting: Advanced Practice Midwife

## 2018-12-14 ENCOUNTER — Other Ambulatory Visit: Payer: Self-pay

## 2018-12-14 DIAGNOSIS — N930 Postcoital and contact bleeding: Secondary | ICD-10-CM

## 2018-12-14 DIAGNOSIS — R102 Pelvic and perineal pain: Secondary | ICD-10-CM | POA: Insufficient documentation

## 2018-12-18 ENCOUNTER — Encounter: Payer: Self-pay | Admitting: Advanced Practice Midwife

## 2018-12-18 DIAGNOSIS — N83202 Unspecified ovarian cyst, left side: Secondary | ICD-10-CM | POA: Insufficient documentation

## 2018-12-19 ENCOUNTER — Other Ambulatory Visit: Payer: Self-pay | Admitting: *Deleted

## 2018-12-19 DIAGNOSIS — R102 Pelvic and perineal pain: Secondary | ICD-10-CM

## 2018-12-29 ENCOUNTER — Other Ambulatory Visit: Payer: Self-pay

## 2018-12-29 DIAGNOSIS — F909 Attention-deficit hyperactivity disorder, unspecified type: Secondary | ICD-10-CM

## 2019-01-01 MED ORDER — AMPHETAMINE-DEXTROAMPHET ER 20 MG PO CP24: 20.0000 mg | ORAL_CAPSULE | Freq: Every day | ORAL | 0 refills | Status: DC

## 2019-01-29 ENCOUNTER — Other Ambulatory Visit: Payer: Self-pay

## 2019-01-29 DIAGNOSIS — F909 Attention-deficit hyperactivity disorder, unspecified type: Secondary | ICD-10-CM

## 2019-01-29 MED ORDER — AMPHETAMINE-DEXTROAMPHET ER 20 MG PO CP24: 20.0000 mg | ORAL_CAPSULE | Freq: Every day | ORAL | 0 refills | Status: DC

## 2019-01-31 ENCOUNTER — Other Ambulatory Visit: Payer: Self-pay | Admitting: Family Medicine

## 2019-01-31 ENCOUNTER — Telehealth: Payer: Self-pay | Admitting: Family Medicine

## 2019-01-31 DIAGNOSIS — F909 Attention-deficit hyperactivity disorder, unspecified type: Secondary | ICD-10-CM

## 2019-01-31 MED ORDER — AMPHETAMINE-DEXTROAMPHET ER 20 MG PO CP24: 20.0000 mg | ORAL_CAPSULE | Freq: Every day | ORAL | 0 refills | Status: DC

## 2019-01-31 NOTE — Telephone Encounter (Signed)
Spoke to pt about ADHD med. Pt stated that it was sent to the wrong pharmacy, and needed to be sent to [address] , [place] ph. [phone]  Fax. [phone]. Salvatore Marvel, CMA

## 2019-01-31 NOTE — Telephone Encounter (Signed)
I sent the medication to the correct pharmacy. Thank you!  Milus Banister, [place], PGY-2 01/31/2019 5:37 PM

## 2019-01-31 NOTE — Telephone Encounter (Signed)
Patient mom is checking on the status of the ADHD meds that she requested on 01/29/19.  Please call her to let her know if you can today at [phone].

## 2019-02-01 ENCOUNTER — Ambulatory Visit (HOSPITAL_BASED_OUTPATIENT_CLINIC_OR_DEPARTMENT_OTHER)
Admission: RE | Admit: 2019-02-01 | Discharge: 2019-02-01 | Disposition: A | Discharge status: Home / Self Care | Payer: Medicaid Other | Source: Ambulatory Visit | Attending: Advanced Practice Midwife | Admitting: Advanced Practice Midwife

## 2019-02-01 ENCOUNTER — Other Ambulatory Visit: Payer: Self-pay

## 2019-02-01 DIAGNOSIS — R102 Pelvic and perineal pain: Secondary | ICD-10-CM

## 2019-02-01 NOTE — Telephone Encounter (Signed)
Attempted to reach pt. No answer. LVM to inform pt that her Rx was sent to the [facility] on [address], [place]. Salvatore Marvel, CMA

## 2019-02-02 ENCOUNTER — Other Ambulatory Visit: Payer: Self-pay | Admitting: Internal Medicine

## 2019-02-02 DIAGNOSIS — Z20822 Contact with and (suspected) exposure to covid-19: Secondary | ICD-10-CM

## 2019-02-03 LAB — NOVEL CORONAVIRUS, NAA: SARS-CoV-2, NAA: NOT DETECTED

## 2019-02-05 ENCOUNTER — Telehealth: Payer: Self-pay

## 2019-02-05 NOTE — Telephone Encounter (Signed)
Patient calling stating she wants her results from her ultrasound.  Sent to provider for review and plan of care. Kathrene Alu RN

## 2019-02-06 NOTE — Telephone Encounter (Signed)
-----   Message from [place], [place] sent at 02/06/2019  8:36 AM EDT ----- Your ultrasound shows that your left ovarian cyst has almost completely resolved so we don't need to do any more ultrasounds or follow-up. Your IUD is in the correct position.

## 2019-02-06 NOTE — Telephone Encounter (Signed)
Left message for patient to return call to office. Jennifer Howard  RN 

## 2019-02-06 NOTE — Telephone Encounter (Signed)
Patient made aware that left ovarian cyst has almost resolved and no need for anymore ultrasounds or follow on this. Patient also made aware that IUD is in correct position. Patient states understanding. Kathrene Alu RN

## 2019-03-05 ENCOUNTER — Ambulatory Visit (INDEPENDENT_AMBULATORY_CARE_PROVIDER_SITE_OTHER): Payer: Medicaid Other | Admitting: Student in an Organized Health Care Education/Training Program

## 2019-03-05 ENCOUNTER — Other Ambulatory Visit: Payer: Self-pay

## 2019-03-05 VITALS — BP 105/80 | HR 84 | Wt 194.6 lb

## 2019-03-05 DIAGNOSIS — Z30432 Encounter for removal of intrauterine contraceptive device: Secondary | ICD-10-CM

## 2019-03-05 NOTE — Progress Notes (Signed)
   Subjective:    Patient ID: Cynthia Kent Record, female    DOB: 1990/09/28, 28 y.o.   MRN: [id number]  CC: IUD removal  HPI:  Patient has had IUD for about a year and has seen several providers about discomfort. She was diagnosed with an ovarian cyst which was seen to have resolved on repeat imaging. This was likely a large component of the discomfort that she felt. She is complaining of continued pain particularly with sex. She has one female partner and has had to interrupt intercourse several times because insertion is so painful. Denies dysuria, abnormal bleeding, discharge, or blood in urine. She has endorsed some blood post coitus a couple of times.   Patient also asked when her refill was due for her adderall which is not due yet. She will call PCP when needs refill.  Smoking status reviewed   ROS: pertinent noted in the HPI   I have personally reviewed pertinent past medical history, surgical, family, and social history as appropriate. Objective:  BP 105/80   Pulse 84   Wt 194 lb 9.6 oz (88.3 kg)   SpO2 98%   BMI 31.41 kg/m   Vitals and nursing note reviewed  General: NAD, pleasant, able to participate in exam Pelvic: negative for external genitalia abnormalities. The IUD was very easily removed and did not seem to be set properly in the uterus on withdrawal.  Extremities: no edema or cyanosis. Skin: warm and dry, no rashes noted Neuro: alert, no obvious focal deficits Psych: Normal affect and mood  Assessment & Plan:   Cynthia Kent is a 28 y.o. G2P1001 here for [place] IUD removal.  Last pap smear was on 06/2017 and was normal.  IUD Removal  Patient identified, informed consent performed, consent signed.  Patient was in the dorsal lithotomy position, normal external genitalia was noted.  A speculum was placed in the patient's vagina, normal discharge was noted, no lesions. The cervix was visualized, no lesions, no abnormal discharge.  The strings of the IUD were  grasped and pulled using ring forceps. The IUD was removed in its entirety. Patient tolerated the procedure well.  she refused any lubricant on the speculum as she thinks this throws off her vaginal pH so insertion was slightly difficult.   Patient will use condoms for contraception.  Routine preventative health maintenance measures emphasized.  Doristine Mango, [place] PGY-2

## 2019-03-05 NOTE — Patient Instructions (Signed)
It was a pleasure to see you today!  To summarize our discussion for this visit:  You are taking out your IUD today and doing a thorough inspection of your vagina to look for any causes of your pain.  You are choosing to not get any other forms of birth control today and if plan to return we have decided on which option you would like.  Please note that you are going to be able to get pregnant immediately if not on a type of birth control.  I would recommend using a backup method of birth control such as abstinence or condoms and taking a prenatal vitamin just in case.  I would recommend doing Kegel exercises and if still experiencing symptoms follow-up with your gynecologist   Call the clinic at [phone] if your symptoms worsen or you have any concerns.   Thank you for allowing me to take part in your care,  Dr. Doristine Mango  Kegel Exercises  Kegel exercises can help strengthen your pelvic floor muscles. The pelvic floor is a group of muscles that support your rectum, small intestine, and bladder. In females, pelvic floor muscles also help support the womb (uterus). These muscles help you control the flow of urine and stool. Kegel exercises are painless and simple, and they do not require any equipment. Your provider may suggest Kegel exercises to:  Improve bladder and bowel control.  Improve sexual response.  Improve weak pelvic floor muscles after surgery to remove the uterus (hysterectomy) or pregnancy (females).  Improve weak pelvic floor muscles after prostate gland removal or surgery (males). Kegel exercises involve squeezing your pelvic floor muscles, which are the same muscles you squeeze when you try to stop the flow of urine or keep from passing gas. The exercises can be done while sitting, standing, or lying down, but it is best to vary your position. Exercises How to do Kegel exercises: 1. Squeeze your pelvic floor muscles tight. You should feel a tight lift in  your rectal area. If you are a female, you should also feel a tightness in your vaginal area. Keep your stomach, buttocks, and legs relaxed. 2. Hold the muscles tight for up to 10 seconds. 3. Breathe normally. 4. Relax your muscles. 5. Repeat as told by your health care provider. Repeat this exercise daily as told by your health care provider. Continue to do this exercise for at least 4-6 weeks, or for as long as told by your health care provider. You may be referred to a physical therapist who can help you learn more about how to do Kegel exercises. Depending on your condition, your health care provider may recommend:  Varying how long you squeeze your muscles.  Doing several sets of exercises every day.  Doing exercises for several weeks.  Making Kegel exercises a part of your regular exercise routine. This information is not intended to replace advice given to you by your health care provider. Make sure you discuss any questions you have with your health care provider. Document Released: 03/29/2012 Document Revised: 11/30/2017 Document Reviewed: 11/30/2017 Elsevier Patient Education  2020 [employer].

## 2019-03-27 ENCOUNTER — Ambulatory Visit (INDEPENDENT_AMBULATORY_CARE_PROVIDER_SITE_OTHER): Payer: Medicaid Other | Admitting: Family Medicine

## 2019-03-27 ENCOUNTER — Other Ambulatory Visit: Payer: Self-pay

## 2019-03-27 ENCOUNTER — Encounter: Payer: Self-pay | Admitting: Family Medicine

## 2019-03-27 VITALS — BP 118/78 | HR 75 | Wt 196.8 lb

## 2019-03-27 DIAGNOSIS — R350 Frequency of micturition: Secondary | ICD-10-CM | POA: Diagnosis not present

## 2019-03-27 DIAGNOSIS — Z309 Encounter for contraceptive management, unspecified: Secondary | ICD-10-CM | POA: Diagnosis present

## 2019-03-27 DIAGNOSIS — Z3490 Encounter for supervision of normal pregnancy, unspecified, unspecified trimester: Secondary | ICD-10-CM

## 2019-03-27 DIAGNOSIS — F329 Major depressive disorder, single episode, unspecified: Secondary | ICD-10-CM

## 2019-03-27 DIAGNOSIS — N329 Bladder disorder, unspecified: Secondary | ICD-10-CM

## 2019-03-27 DIAGNOSIS — Z3009 Encounter for other general counseling and advice on contraception: Secondary | ICD-10-CM | POA: Diagnosis not present

## 2019-03-27 DIAGNOSIS — F32A Depression, unspecified: Secondary | ICD-10-CM

## 2019-03-27 DIAGNOSIS — F419 Anxiety disorder, unspecified: Secondary | ICD-10-CM | POA: Diagnosis not present

## 2019-03-27 LAB — POCT URINALYSIS DIP (MANUAL ENTRY)
Bilirubin, UA: NEGATIVE
Glucose, UA: NEGATIVE mg/dL
Ketones, POC UA: NEGATIVE mg/dL
Nitrite, UA: NEGATIVE
Protein Ur, POC: NEGATIVE mg/dL
Spec Grav, UA: 1.025 (ref 1.010–1.025)
Urobilinogen, UA: 0.2 E.U./dL
pH, UA: 6.5 (ref 5.0–8.0)

## 2019-03-27 LAB — POCT URINE PREGNANCY: Preg Test, Ur: NEGATIVE

## 2019-03-27 MED ORDER — VITAFOL GUMMIES 3.33-0.333-34.8 MG PO CHEW: 3.0000 | CHEWABLE_TABLET | Freq: Every day | ORAL | 12 refills | Status: DC

## 2019-03-27 NOTE — Progress Notes (Signed)
   Subjective:    Patient ID: Cynthia Kent, female    DOB: 12/26/1990, 28 y.o.   MRN: [id number]   CC: "issues"  HPI: Contraception: This month the patient had her IUD removed after having it for 1 year.  She is not currently planning on getting pregnant, however has not been taking any measures to prevent pregnancy.  She states she would not mind having another baby, "to get it out of the way".  She has 2 other kids.  She is not interested in starting another form of contraception right now.  She cannot exactly state when her last period was that she tends to have spotting and her periods have always been irregular.  Urinary frequency: Patient reports urinary frequency that started ever since she got her IUD removed about 1 month ago.  Denies any fevers, burning with urination, pain with urination, urinary urgency, or lower abdominal pain.  Depression: Patient had a PHQ-9 score today of 21.  She is currently taking Zoloft.  Does not appear distressed at today's visit.  Denies any plan or desire to hurt herself or someone else.  Smoking status reviewed - smokes currently, 1ppd  Review of Systems -see HPI   Objective:  BP 118/78   Pulse 75   Wt 196 lb 12.8 oz (89.3 kg)   SpO2 100%   BMI 31.76 kg/m  Vitals and nursing note reviewed  General: No apparent distress, patient is laughing during interview, very pleasant Cardiac: RRR, clear S1 and S2, no murmurs appreciated Respiratory: CTA bilaterally, normal work of breathing Abdomen: Normal bowel sounds appreciated Extremities: no edema  Assessment & Plan:   Anxiety and depression PHQ-9 score 21 today.  She denies any desire to harm herself or someone else.  Does not appear in any distress during the interview.  She is very pleasant, frequently laughing. -Patient instructed to continue her Zoloft  Encounter for counseling regarding contraception Patient had IUD removed March 05, 2019 after 1 year of use.  She is currently  having unprotected sex with her significant other.  Is not opposed to the idea of having another baby.  Pregnancy test at today's visit negative. -Patient instructed to stop taking her amphetamine if she desires to become pregnant -Patient also currently smoking 1 pack/day, she was strongly encouraged to stop smoking should she desire to become pregnant -Patient was prescribed a prenatal vitamin  Urinary frequency -Ordering urinalysis with microscopy -Urine culture as needed to rule out UTI as cause for patient's urinary frequency   Return if symptoms worsen or fail to improve.   Dr. Milus Banister [facility], PGY-2

## 2019-03-27 NOTE — Patient Instructions (Addendum)
Thank you for coming in to see [place] today! Please see below to review our plan for today's visit:  1. You could consider increasing the dose of Zoloft to help improve your symptoms of depression and anxiety. This will also help you concentrate. 2. If you're considering having a baby, PLEASE STOP SMOKING!!!! And take your prenatal CHEWY every day! 3. Please use condoms in the meantime... this is for you Cynthia Kent! Your shirt is awesome.  Please call the clinic at [phone] if your symptoms worsen or you have any concerns. It was our pleasure to serve you!    Dr. Milus Banister [facility]

## 2019-03-28 DIAGNOSIS — Z3009 Encounter for other general counseling and advice on contraception: Secondary | ICD-10-CM | POA: Insufficient documentation

## 2019-03-28 DIAGNOSIS — R350 Frequency of micturition: Secondary | ICD-10-CM | POA: Insufficient documentation

## 2019-03-28 NOTE — Assessment & Plan Note (Signed)
PHQ-9 score 21 today.  She denies any desire to harm herself or someone else.  Does not appear in any distress during the interview.  She is very pleasant, frequently laughing. -Patient instructed to continue her Zoloft

## 2019-03-28 NOTE — Assessment & Plan Note (Signed)
-  Ordering urinalysis with microscopy -Urine culture as needed to rule out UTI as cause for patient's urinary frequency

## 2019-03-28 NOTE — Assessment & Plan Note (Signed)
Patient had IUD removed March 05, 2019 after 1 year of use.  She is currently having unprotected sex with her significant other.  Is not opposed to the idea of having another baby.  Pregnancy test at today's visit negative. -Patient instructed to stop taking her amphetamine if she desires to become pregnant -Patient also currently smoking 1 pack/day, she was strongly encouraged to stop smoking should she desire to become pregnant -Patient was prescribed a prenatal vitamin

## 2019-03-29 LAB — URINE CULTURE

## 2019-03-30 ENCOUNTER — Other Ambulatory Visit: Payer: Self-pay | Admitting: Family Medicine

## 2019-03-30 DIAGNOSIS — N3001 Acute cystitis with hematuria: Secondary | ICD-10-CM

## 2019-03-30 MED ORDER — SULFAMETHOXAZOLE-TRIMETHOPRIM 800-160 MG PO TABS: 1.0000 | ORAL_TABLET | Freq: Two times a day (BID) | ORAL | 0 refills | Status: AC

## 2019-03-30 NOTE — Progress Notes (Signed)
Patient contacted via phone about Proteus mirabilis UTI. Was prescribed Bactrim DS BID x 7 days due to patient also having questionable hematuria at last appt. Patient voiced understanding of plan, will go to get medication now.  Milus Banister, [place], PGY-2 03/30/2019 12:53 PM

## 2019-04-05 ENCOUNTER — Ambulatory Visit: Payer: Medicaid Other | Admitting: Family Medicine

## 2019-05-04 ENCOUNTER — Other Ambulatory Visit: Payer: Self-pay

## 2019-05-04 DIAGNOSIS — F909 Attention-deficit hyperactivity disorder, unspecified type: Secondary | ICD-10-CM

## 2019-05-07 MED ORDER — AMPHETAMINE-DEXTROAMPHET ER 20 MG PO CP24: 20.0000 mg | ORAL_CAPSULE | Freq: Every day | ORAL | 0 refills | Status: DC

## 2019-07-12 IMAGING — US US MFM OB DETAIL+14 WK
1 series · 14 of 28 positions shown · non-contrast
Comparison: none

[Series 1: us mfm ob detail+14 wk · 75 acquisitions, 14 frames shown]
[im 3/75]
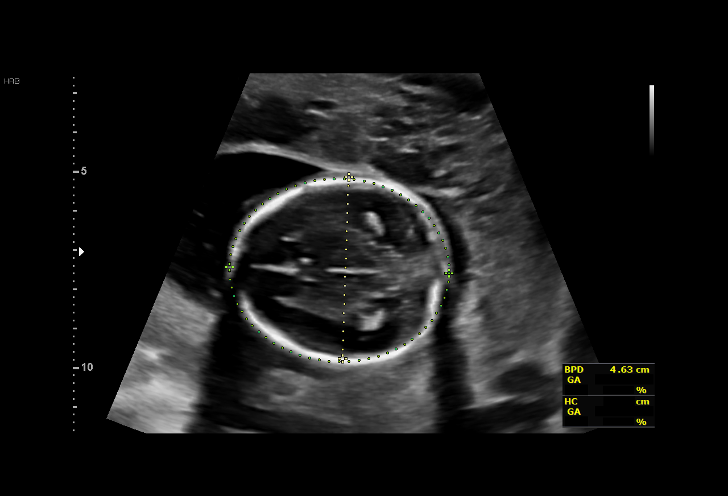
[im 9/75]
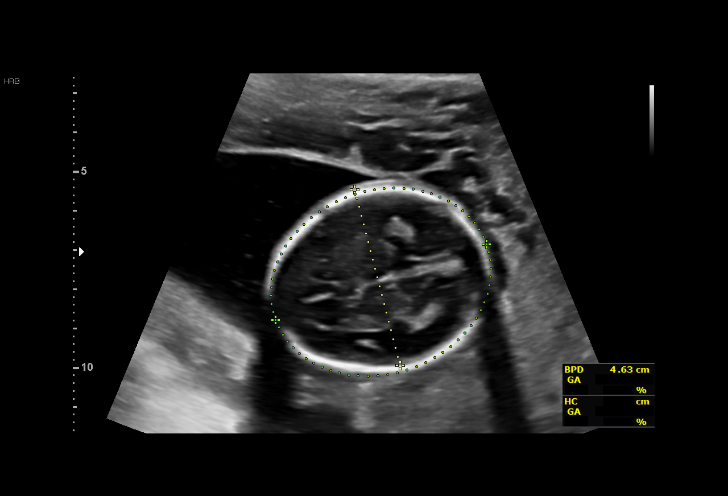
[im 14/75]
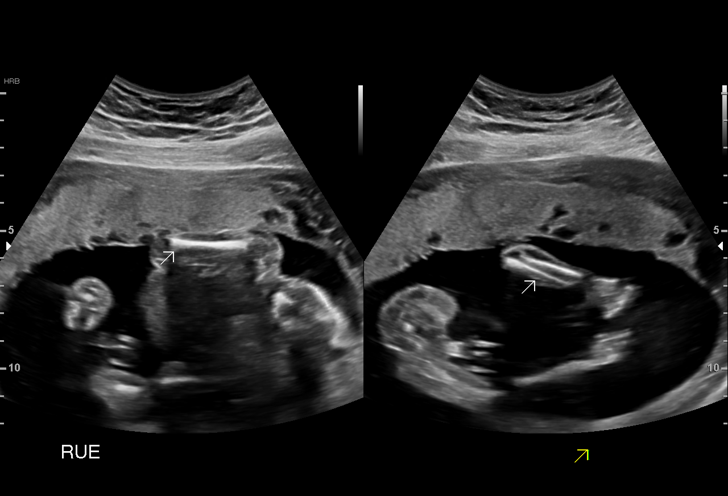
[im 20/75]
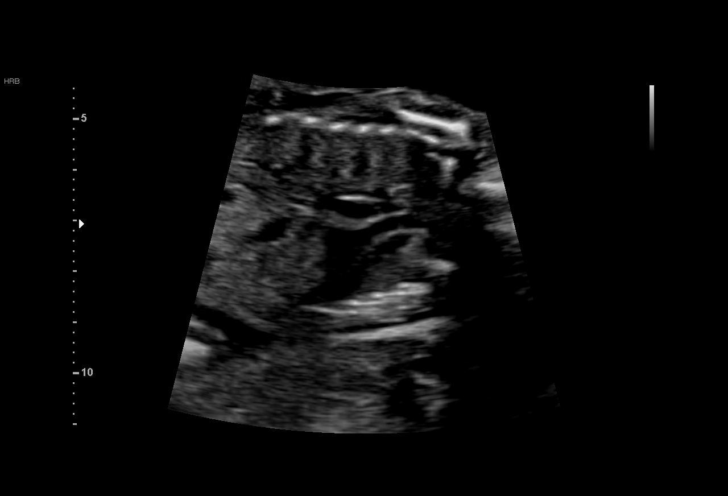
[im 25/75]
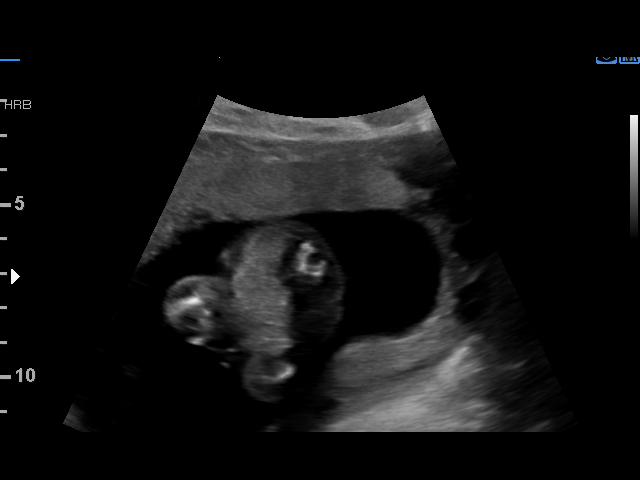
[im 31/75]
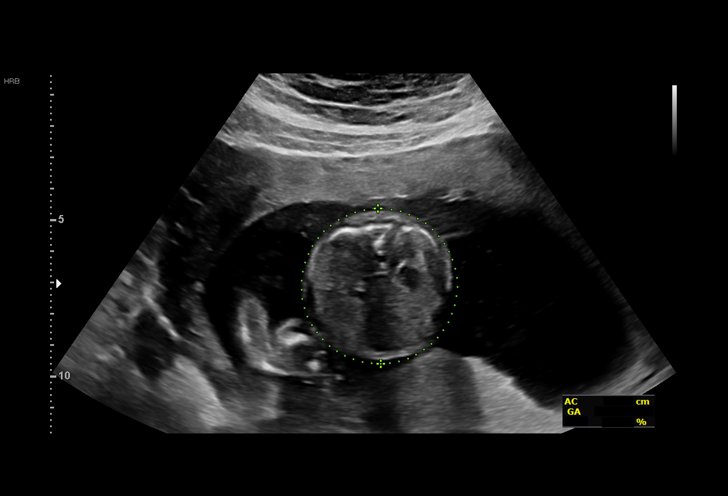
[im 36/75]
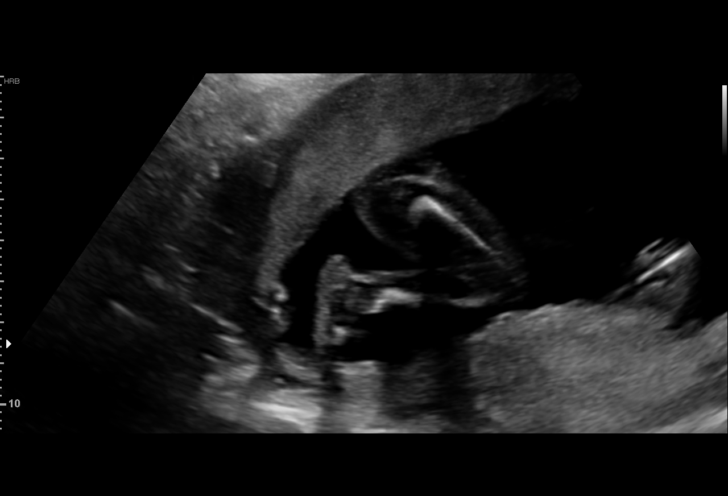
[im 42/75]
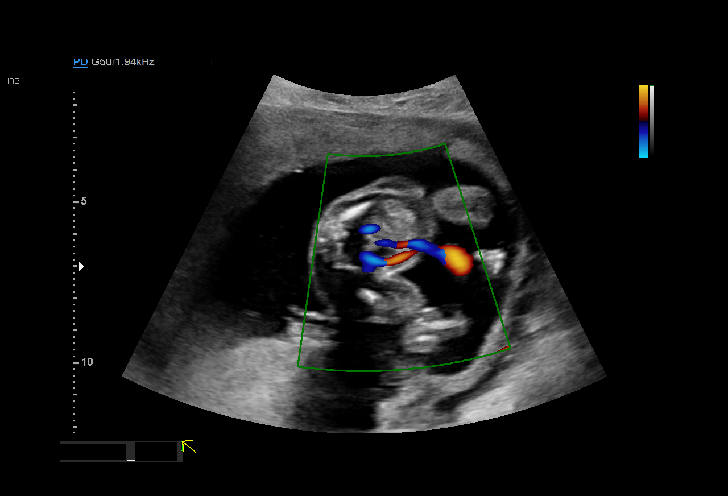
[im 47/75]
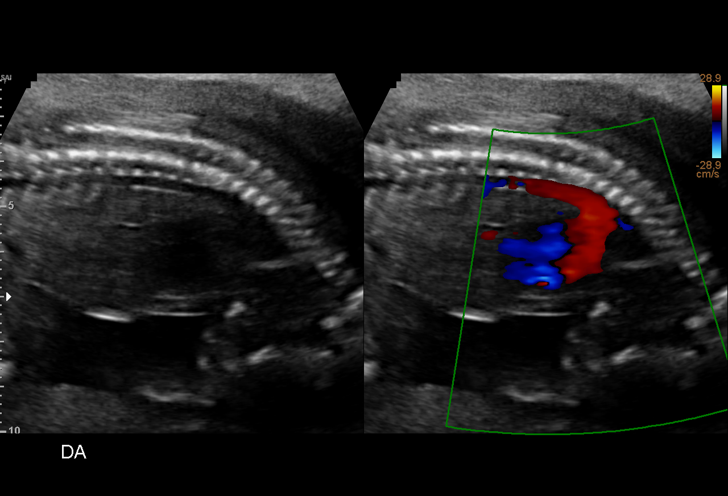
[im 53/75]
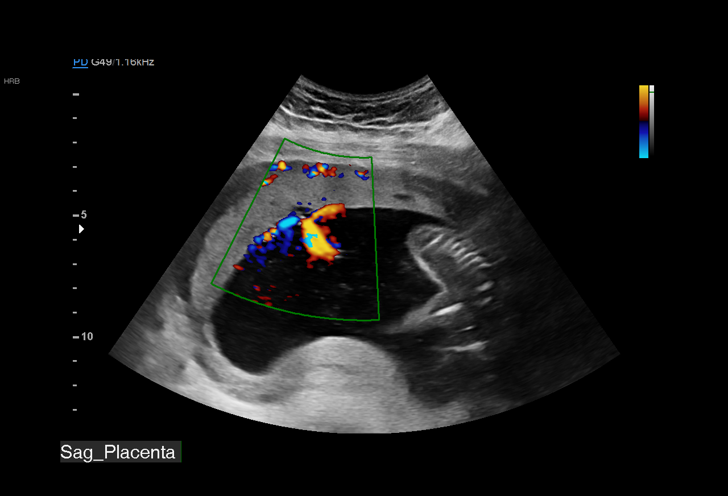
[im 58/75]
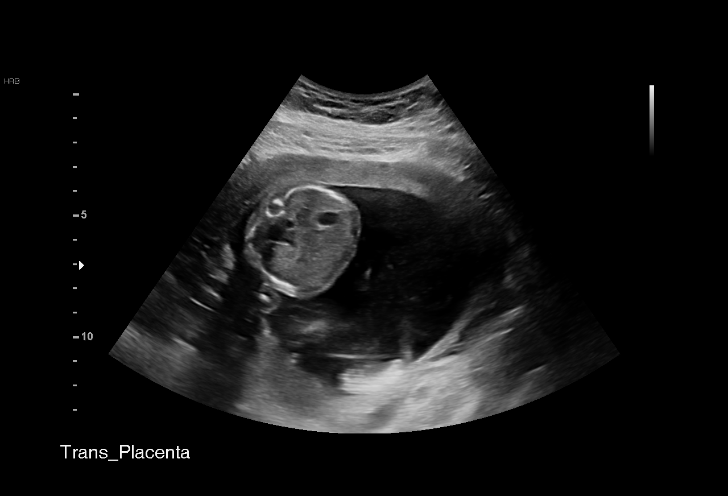
[im 64/75]
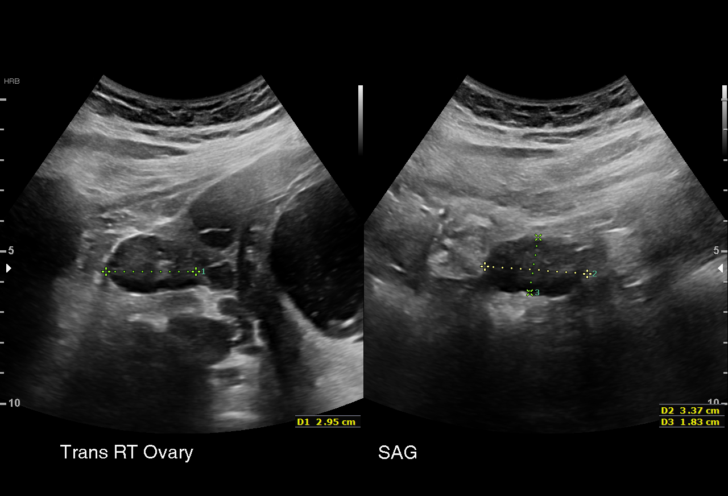
[im 69/75]
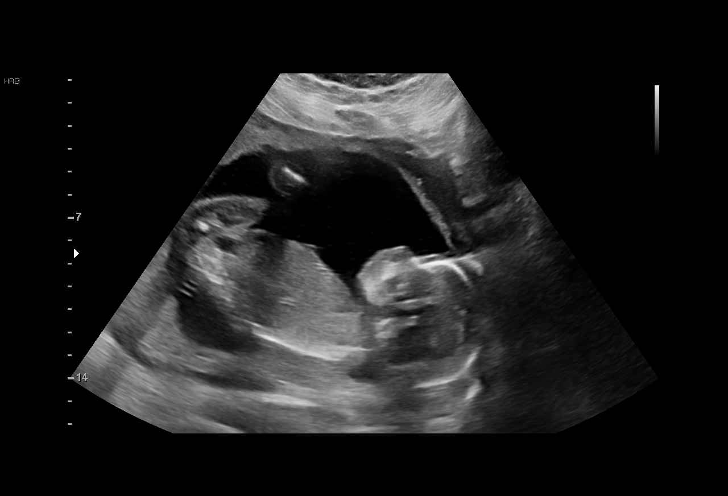
[im 75/75]
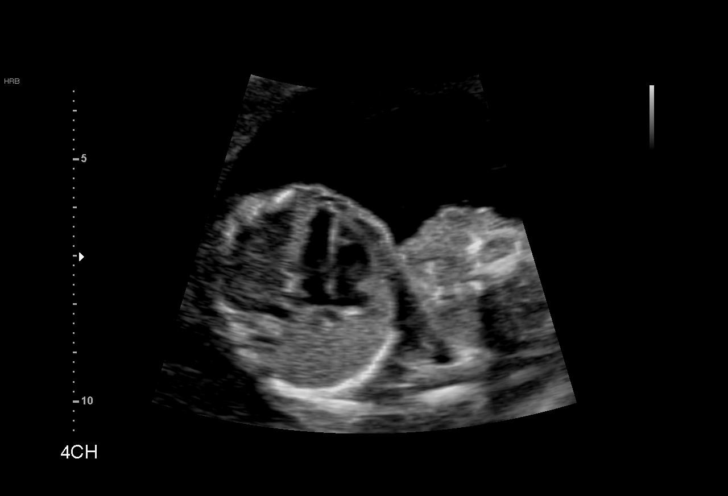

[14 of 28 positions shown; findings below may reference images not displayed]

Road [HOSPITAL]

Indications

19 weeks gestation of pregnancy
Encounter for antenatal screening for
malformations
Obesity complicating pregnancy, second
trimester
History of cesarean delivery, currently
pregnant

OB History

Blood Type:            Height:  5'6"   Weight (lb):  207       BMI:
Gravidity:    2         Term:   1
Living:       1
Fetal Evaluation

Num Of Fetuses:     1
Fetal Heart         155
Rate(bpm):
Cardiac Activity:   Observed
Presentation:       Cephalic
Placenta:           Anterior, above cervical os
P. Cord Insertion:  Visualized, central

Amniotic Fluid
AFI FV:      Subjectively within normal limits

Largest Pocket(cm)
5.42
Biometry

BPD:      46.5  mm     G. Age:  20w 0d         59  %    CI:         81.3   %    70 - 86
FL/HC:      18.9   %    16.8 -
HC:      162.8  mm     G. Age:  19w 1d         12  %    HC/AC:      1.05        1.09 -
AC:      155.5  mm     G. Age:  20w 5d         73  %    FL/BPD:     66.0   %
FL:       30.7  mm     G. Age:  19w 4d         30  %    FL/AC:      19.7   %    20 - 24
HUM:      28.7  mm     G. Age:  19w 2d         39  %
CER:      19.8  mm     G. Age:  18w 6d         28  %
NFT:       3.8  mm
CM:        5.9  mm

Est. FW:     329  gm    0 lb 12 oz      52  %
Gestational Age

LMP:           18w 3d        Date:  04/19/17                 EDD:   01/24/18
U/S Today:     19w 6d                                        EDD:   01/14/18
Best:          19w 6d     Det. By:  U/S (08/26/17)           EDD:   01/14/18
Anatomy

Cranium:               Appears normal         Aortic Arch:            Appears normal
Cavum:                 Appears normal         Ductal Arch:            Appears normal w/
color doppler
Ventricles:            Appears normal         Diaphragm:              Appears normal
Choroid Plexus:        Appears normal         Stomach:                Appears normal, left
sided
Cerebellum:            Appears normal         Abdomen:                Appears normal
Posterior Fossa:       Appears normal         Abdominal Wall:         Appears nml (cord
insert, abd wall)
Nuchal Fold:           Appears normal         Cord Vessels:           Appears normal (3
vessel cord)
Face:                  Orbits nl; profile not Kidneys:                Appear normal
well visualized
Lips:                  Not well visualized    Bladder:                Appears normal
Thoracic:              Appears normal         Spine:                  Appears normal
Heart:                 Not well visualized    Upper Extremities:      Appears normal
RVOT:                  Appears normal         Lower Extremities:      Appears normal
LVOT:                  Appears normal

Other:  Male gender. Heels visualized. Technically difficult due to maternal
habitus and fetal position.
Cervix Uterus Adnexa

Cervix
Length:           3.67  cm.
Normal appearance by transabdominal scan.

Uterus
No abnormality visualized.

Left Ovary
Not visualized.

Right Ovary
Within normal limits.

Cul De Sac:   No free fluid seen.
Adnexa:       No abnormality visualized.
Impression

Single living intrauterine pregnancy at 19w 6d.
Placenta Anterior, above cervical os.
Appropriate fetal growth.
Normal amniotic fluid volume.
The fetal anatomic survey is not complete.
No gross fetal anomalies identified.
The cervix measures 3.67cm transabdominally without
funneling.
The adnexa appear normal bilaterally without masses.
Recommendations

Recommend follow-up ultrasound examination in 6 weeks to
complete survey.

## 2019-08-14 ENCOUNTER — Other Ambulatory Visit: Payer: Self-pay

## 2019-08-14 DIAGNOSIS — F909 Attention-deficit hyperactivity disorder, unspecified type: Secondary | ICD-10-CM

## 2019-08-16 MED ORDER — AMPHETAMINE-DEXTROAMPHET ER 20 MG PO CP24: 20.0000 mg | ORAL_CAPSULE | Freq: Every day | ORAL | 0 refills | Status: DC

## 2019-08-31 ENCOUNTER — Encounter: Payer: Self-pay | Admitting: Family Medicine

## 2019-08-31 ENCOUNTER — Other Ambulatory Visit: Payer: Self-pay

## 2019-08-31 ENCOUNTER — Ambulatory Visit (INDEPENDENT_AMBULATORY_CARE_PROVIDER_SITE_OTHER): Payer: Medicaid Other | Admitting: Family Medicine

## 2019-08-31 VITALS — BP 120/74 | HR 86 | Ht 66.0 in | Wt 181.0 lb

## 2019-08-31 DIAGNOSIS — R109 Unspecified abdominal pain: Secondary | ICD-10-CM

## 2019-08-31 DIAGNOSIS — F419 Anxiety disorder, unspecified: Secondary | ICD-10-CM | POA: Diagnosis not present

## 2019-08-31 DIAGNOSIS — F329 Major depressive disorder, single episode, unspecified: Secondary | ICD-10-CM

## 2019-08-31 MED ORDER — IBUPROFEN 400 MG PO TABS: 600.0000 mg | ORAL_TABLET | Freq: Four times a day (QID) | ORAL | 3 refills | Status: DC | PRN

## 2019-08-31 MED ORDER — ACETAMINOPHEN 500 MG PO TABS: 500.0000 mg | ORAL_TABLET | Freq: Four times a day (QID) | ORAL | 3 refills | Status: DC | PRN

## 2019-08-31 NOTE — Patient Instructions (Addendum)
Thank you for coming in to see [place] today! Please see below to review our plan for today's visit:  1. Try a heating pad when you are having your lower abdominal pains. Also about 2-3 days before your period starts take Ibuprofen 200mg  with Tylenol 500mg  with lots of water every 6 hours scheduled! 2. Use a heating pad for your lower abdominal pain! 15 minutes 3 times daily 3. Get scheduled for your IUD REINSERTION!!!!!  Please call the clinic at [phone] if your symptoms worsen or you have any concerns. It was our pleasure to serve you!  Dr. [facility]

## 2019-09-11 DIAGNOSIS — R109 Unspecified abdominal pain: Secondary | ICD-10-CM | POA: Insufficient documentation

## 2019-09-11 NOTE — Progress Notes (Signed)
    SUBJECTIVE:   CHIEF COMPLAINT / HPI:   Abdominal Pain: Patient presents with 2 weeks of low abdominal pain.  She is concerned that it might be a cyst acting up.  Patient has a history of a left hemorrhagic ovarian cyst.  She reports having some nausea, but denies any vomiting and diarrhea.  She is not currently on any birth control but wants to go back onto an IUD.  Patient reports that she had 2 periods in the month of March, and feels she might be starting her next menstrual cycle very soon.  She does follow-up with OB/GYN for concerns with her cysts Cynthia Kent).  She will occasionally take ibuprofen for her pain.  History of bipolar disorder, MDD, anxiety: Today patient scored a 14 on her PHQ-9.  She answered "0" on question 9.  She reports that she is a single mom and that is oftentimes stressful.  She does have a therapist, peer support, and psychiatrist, reports she sees her therapist at least once monthly.  Assures that she is safe to herself and others.   PERTINENT  PMH / PSH:  ADHD Bipolar disorder PTSD Hemorrhagic cyst of left ovary  OBJECTIVE:   BP 120/74   Pulse 86   Ht 5\' 6"  (1.676 m)   Wt 181 lb (82.1 kg)   LMP 08/29/2019 (Exact Date)   SpO2 98%   Breastfeeding No   BMI 29.21 kg/m    Physical exam General: Well-appearing, nontoxic-appearing Respiratory: CTA bilaterally, comfortable work of breathing Cardiac: RRR, S1-S2 present, no murmurs appreciated Abdomen: Nontender to palpation in right and left upper quadrants, minimal tenderness to palpation directly over uterus/bladder, and left lower quadrants.  No masses appreciated.  ASSESSMENT/PLAN:   Abdominal pain Lower abdominal pain with associated nausea.  Seems to be in pattern with patient's menstrual cycle starting.  Patient has irregular menses since she is no longer on birth control.  Denies any fevers, vomiting, diarrhea, pelvic pain.  Also denies dysuria, urinary frequency, and urinary urgency.  Denies any  unusual vaginal discharge.  Patient given the following instructions: 1. Try a heating pad when having lower abdominal pains. About 2-3 days before  period starts take Ibuprofen 200mg  with Tylenol 500mg  with lots of water every 6 hours scheduled 2. Use a heating pad for lower abdominal pain, 15 minutes 3 times daily 3. Get scheduled for your IUD reinsertion  Anxiety and depression Patient with PHQ-9 score today of 14.  Has close follow-up with psychiatrist, peers support, and a therapist.  Patient is already being treated by behavioral health specialists. -Continue medications prescribed by behavioral health -Highly encouraged patient to follow-up with her therapist sooner than later, and to possibly increase the frequency of visits     10/29/2019, DO [facility]

## 2019-09-11 NOTE — Assessment & Plan Note (Signed)
Patient with PHQ-9 score today of 14.  Has close follow-up with psychiatrist, peers support, and a therapist.  Patient is already being treated by behavioral health specialists. -Continue medications prescribed by behavioral health -Highly encouraged patient to follow-up with her therapist sooner than later, and to possibly increase the frequency of visits

## 2019-09-11 NOTE — Assessment & Plan Note (Signed)
Lower abdominal pain with associated nausea.  Seems to be in pattern with patient's menstrual cycle starting.  Patient has irregular menses since she is no longer on birth control.  Denies any fevers, vomiting, diarrhea, pelvic pain.  Also denies dysuria, urinary frequency, and urinary urgency.  Denies any unusual vaginal discharge.  Patient given the following instructions: 1. Try a heating pad when having lower abdominal pains. About 2-3 days before  period starts take Ibuprofen 200mg  with Tylenol 500mg  with lots of water every 6 hours scheduled 2. Use a heating pad for lower abdominal pain, 15 minutes 3 times daily 3. Get scheduled for your IUD reinsertion

## 2019-09-21 ENCOUNTER — Ambulatory Visit: Payer: Medicaid Other | Admitting: Family Medicine

## 2019-10-01 ENCOUNTER — Ambulatory Visit: Payer: Medicaid Other | Admitting: Family Medicine

## 2019-10-02 ENCOUNTER — Ambulatory Visit: Payer: Medicaid Other | Admitting: Family Medicine

## 2019-10-22 ENCOUNTER — Ambulatory Visit: Payer: Medicaid Other | Admitting: Family Medicine

## 2019-12-12 ENCOUNTER — Other Ambulatory Visit: Payer: Self-pay

## 2019-12-12 DIAGNOSIS — F909 Attention-deficit hyperactivity disorder, unspecified type: Secondary | ICD-10-CM

## 2019-12-13 MED ORDER — AMPHETAMINE-DEXTROAMPHET ER 20 MG PO CP24: 20.0000 mg | ORAL_CAPSULE | Freq: Every day | ORAL | 0 refills | Status: DC

## 2020-01-02 NOTE — Progress Notes (Signed)
   Patient no showed her appointment this morning 01/03/2020.  Was called at [phone] (number listed in chart), no answer, unable to leave voicemail.  This is patient's fourth no-show since June 2021.  Will send letter to patient's house informing her of No-Show policy and that one more no-show or late cancellation will lead to dismissal from the clinic.   Health maintenance: patient due for COVID vaccine, flu vaccine and Hep C Ab screen.   Dollene Cleveland, DO [place] [facility]

## 2020-01-03 ENCOUNTER — Ambulatory Visit (INDEPENDENT_AMBULATORY_CARE_PROVIDER_SITE_OTHER): Payer: Medicaid Other | Admitting: Family Medicine

## 2020-01-03 DIAGNOSIS — Z91199 Patient's noncompliance with other medical treatment and regimen due to unspecified reason: Secondary | ICD-10-CM | POA: Insufficient documentation

## 2020-01-03 DIAGNOSIS — Z5329 Procedure and treatment not carried out because of patient's decision for other reasons: Secondary | ICD-10-CM

## 2020-03-05 ENCOUNTER — Encounter: Payer: Self-pay | Admitting: Family Medicine

## 2020-03-05 ENCOUNTER — Ambulatory Visit (INDEPENDENT_AMBULATORY_CARE_PROVIDER_SITE_OTHER): Payer: Medicaid Other | Admitting: Family Medicine

## 2020-03-05 ENCOUNTER — Other Ambulatory Visit: Payer: Self-pay

## 2020-03-05 VITALS — BP 110/62 | HR 100 | Wt 174.0 lb

## 2020-03-05 DIAGNOSIS — R0781 Pleurodynia: Secondary | ICD-10-CM | POA: Diagnosis not present
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
## 2020-05-02 DIAGNOSIS — T85848A Pain due to other internal prosthetic devices, implants and grafts, initial encounter: Secondary | ICD-10-CM | POA: Insufficient documentation

## 2020-09-10 ENCOUNTER — Other Ambulatory Visit: Payer: Self-pay | Admitting: *Deleted

## 2020-09-10 DIAGNOSIS — F909 Attention-deficit hyperactivity disorder, unspecified type: Secondary | ICD-10-CM

## 2020-09-12 NOTE — Telephone Encounter (Signed)
Patient calls nurse line checking the status of medication refill.  

## 2020-09-13 MED ORDER — AMPHETAMINE-DEXTROAMPHET ER 20 MG PO CP24: 20.0000 mg | ORAL_CAPSULE | Freq: Every day | ORAL | 0 refills | Status: DC

## 2021-01-11 ENCOUNTER — Encounter (HOSPITAL_BASED_OUTPATIENT_CLINIC_OR_DEPARTMENT_OTHER): Payer: Self-pay | Admitting: *Deleted

## 2021-01-11 ENCOUNTER — Emergency Department (HOSPITAL_BASED_OUTPATIENT_CLINIC_OR_DEPARTMENT_OTHER)
Admission: EM | Admit: 2021-01-11 | Discharge: 2021-01-11 | Disposition: A | Discharge status: Home / Self Care | Payer: Medicaid Other | Attending: Emergency Medicine | Admitting: Emergency Medicine

## 2021-01-11 ENCOUNTER — Other Ambulatory Visit: Payer: Self-pay

## 2021-01-11 DIAGNOSIS — J029 Acute pharyngitis, unspecified: Secondary | ICD-10-CM | POA: Diagnosis present

## 2021-01-11 DIAGNOSIS — Z79899 Other long term (current) drug therapy: Secondary | ICD-10-CM | POA: Insufficient documentation

## 2021-01-11 DIAGNOSIS — Z20822 Contact with and (suspected) exposure to covid-19: Secondary | ICD-10-CM | POA: Diagnosis not present

## 2021-01-11 DIAGNOSIS — Z87891 Personal history of nicotine dependence: Secondary | ICD-10-CM | POA: Diagnosis not present

## 2021-01-11 DIAGNOSIS — I10 Essential (primary) hypertension: Secondary | ICD-10-CM | POA: Insufficient documentation

## 2021-01-11 DIAGNOSIS — Z91040 Latex allergy status: Secondary | ICD-10-CM | POA: Insufficient documentation

## 2021-01-11 LAB — GROUP A STREP BY PCR: Group A Strep by PCR: NOT DETECTED

## 2021-01-11 LAB — RESP PANEL BY RT-PCR (FLU A&B, COVID) ARPGX2
Influenza A by PCR: NEGATIVE
Influenza B by PCR: NEGATIVE
SARS Coronavirus 2 by RT PCR: NEGATIVE

## 2021-01-11 MED ORDER — ACETAMINOPHEN 325 MG PO TABS
650.0000 mg | ORAL_TABLET | Freq: Once | ORAL | Status: AC
  Administered 2021-01-11: 650 mg via ORAL
  Filled 2021-01-11: qty 2

## 2021-01-11 MED ORDER — IBUPROFEN 800 MG PO TABS
800.0000 mg | ORAL_TABLET | Freq: Once | ORAL | Status: AC
  Administered 2021-01-11: 800 mg via ORAL
  Filled 2021-01-11: qty 1

## 2021-01-11 NOTE — Discharge Instructions (Signed)
Please use Tylenol or ibuprofen for pain.  You may use 600 mg ibuprofen every 6 hours or 1000 mg of Tylenol every 6 hours.  You may choose to alternate between the 2.  This would be most effective.  Not to exceed 4 g of Tylenol within 24 hours.  Not to exceed 3200 mg ibuprofen 24 hours.  Read the instructions below on reasons to return to the emergency department and to learn more about your diagnosis.  Use over the counter medications for symptomatic relief as we discussed (musinex as a decongestant, Tylenol for fever/pain, Motrin/Ibuprofen for muscle aches). If prescribed a cough suppressant during your visit, do not operate heavy machinery with in 5 hours of taking this medication. Followup with your primary care doctor in 4 days if your symptoms persist.  Your more than welcome to return to the emergency department if symptoms worsen or become concerning.  Upper Respiratory Infection, Adult  An upper respiratory infection (URI) is also sometimes known as the common cold. Most people improve within 1 week, but symptoms can last up to 2 weeks. A residual cough may last even longer.   URI is most commonly caused by a virus. Viruses are NOT treated with antibiotics. You can easily spread the virus to others by oral contact. This includes kissing, sharing a glass, coughing, or sneezing. Touching your mouth or nose and then touching a surface, which is then touched by another person, can also spread the virus.   TREATMENT  Treatment is directed at relieving symptoms. There is no cure. Antibiotics are not effective, because the infection is caused by a virus, not by bacteria. Treatment may include:  Increased fluid intake. Sports drinks offer valuable electrolytes, sugars, and fluids.  Breathing heated mist or steam (vaporizer or shower).  Eating chicken soup or other clear broths, and maintaining good nutrition.  Getting plenty of rest.  Using gargles or lozenges for comfort.  Controlling fevers with  ibuprofen or acetaminophen as directed by your caregiver.  Increasing usage of your inhaler if you have asthma.  Return to work when your temperature has returned to normal.   SEEK MEDICAL CARE IF:  After the first few days, you feel you are getting worse rather than better.  You develop worsening shortness of breath, or brown or red sputum. These may be signs of pneumonia.  You develop yellow or brown nasal discharge or pain in the face, especially when you bend forward. These may be signs of sinusitis.  You develop a fever, swollen neck glands, pain with swallowing, or white areas in the back of your throat. These may be signs of strep throat.
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
## 2021-01-11 NOTE — ED Notes (Signed)
Pt. Incontinent of urine; had a change of clothes in her car.

## 2021-01-11 NOTE — ED Triage Notes (Signed)
Sore throat for 2 days

## 2021-02-04 ENCOUNTER — Encounter (HOSPITAL_BASED_OUTPATIENT_CLINIC_OR_DEPARTMENT_OTHER): Payer: Self-pay

## 2021-02-04 ENCOUNTER — Emergency Department (HOSPITAL_BASED_OUTPATIENT_CLINIC_OR_DEPARTMENT_OTHER)
Admission: EM | Admit: 2021-02-04 | Discharge: 2021-02-04 | Disposition: A | Discharge status: Home / Self Care | Payer: Medicaid Other | Attending: Emergency Medicine | Admitting: Emergency Medicine

## 2021-02-04 ENCOUNTER — Emergency Department (HOSPITAL_BASED_OUTPATIENT_CLINIC_OR_DEPARTMENT_OTHER): Payer: Medicaid Other

## 2021-02-04 ENCOUNTER — Other Ambulatory Visit: Payer: Self-pay

## 2021-02-04 DIAGNOSIS — R0981 Nasal congestion: Secondary | ICD-10-CM | POA: Diagnosis not present

## 2021-02-04 DIAGNOSIS — K219 Gastro-esophageal reflux disease without esophagitis: Secondary | ICD-10-CM | POA: Diagnosis not present

## 2021-02-04 DIAGNOSIS — R059 Cough, unspecified: Secondary | ICD-10-CM | POA: Insufficient documentation

## 2021-02-04 DIAGNOSIS — R079 Chest pain, unspecified: Secondary | ICD-10-CM | POA: Diagnosis not present

## 2021-02-04 DIAGNOSIS — O219 Vomiting of pregnancy, unspecified: Secondary | ICD-10-CM | POA: Insufficient documentation

## 2021-02-04 DIAGNOSIS — Z87891 Personal history of nicotine dependence: Secondary | ICD-10-CM | POA: Diagnosis not present

## 2021-02-04 DIAGNOSIS — I10 Essential (primary) hypertension: Secondary | ICD-10-CM | POA: Diagnosis not present

## 2021-02-04 DIAGNOSIS — Z20822 Contact with and (suspected) exposure to covid-19: Secondary | ICD-10-CM | POA: Insufficient documentation

## 2021-02-04 DIAGNOSIS — R1084 Generalized abdominal pain: Secondary | ICD-10-CM

## 2021-02-04 DIAGNOSIS — R109 Unspecified abdominal pain: Secondary | ICD-10-CM

## 2021-02-04 DIAGNOSIS — O0289 Other abnormal products of conception: Secondary | ICD-10-CM | POA: Insufficient documentation

## 2021-02-04 DIAGNOSIS — Z91040 Latex allergy status: Secondary | ICD-10-CM | POA: Insufficient documentation

## 2021-02-04 DIAGNOSIS — Z3201 Encounter for pregnancy test, result positive: Secondary | ICD-10-CM

## 2021-02-04 LAB — COMPREHENSIVE METABOLIC PANEL
ALT: 21 U/L (ref 0–44)
AST: 18 U/L (ref 15–41)
Albumin: 3.8 g/dL (ref 3.5–5.0)
Alkaline Phosphatase: 45 U/L (ref 38–126)
Anion gap: 6 (ref 5–15)
BUN: 11 mg/dL (ref 6–20)
CO2: 25 mmol/L (ref 22–32)
Calcium: 8.7 mg/dL — ABNORMAL LOW (ref 8.9–10.3)
Chloride: 104 mmol/L (ref 98–111)
Creatinine, Ser: 0.79 mg/dL (ref 0.44–1.00)
GFR, Estimated: >60 mL/min (ref >60)
Glucose, Bld: 92 mg/dL (ref 70–99)
Potassium: 3.5 mmol/L (ref 3.5–5.1)
Sodium: 135 mmol/L (ref 135–145)
Total Bilirubin: 0.3 mg/dL (ref 0.3–1.2)
Total Protein: 6.8 g/dL (ref 6.5–8.1)

## 2021-02-04 LAB — RESP PANEL BY RT-PCR (FLU A&B, COVID) ARPGX2
Influenza A by PCR: NEGATIVE
Influenza B by PCR: NEGATIVE
SARS Coronavirus 2 by RT PCR: NEGATIVE

## 2021-02-04 LAB — URINALYSIS, ROUTINE W REFLEX MICROSCOPIC
Bilirubin Urine: NEGATIVE
Glucose, UA: NEGATIVE mg/dL
Hgb urine dipstick: NEGATIVE
Ketones, ur: NEGATIVE mg/dL
Leukocytes,Ua: NEGATIVE
Nitrite: NEGATIVE
Protein, ur: NEGATIVE mg/dL
Specific Gravity, Urine: 1.02 (ref 1.005–1.030)
pH: 6.5 (ref 5.0–8.0)

## 2021-02-04 LAB — PREGNANCY, URINE: Preg Test, Ur: POSITIVE — AB

## 2021-02-04 LAB — CBC
HCT: 38.1 % (ref 36.0–46.0)
Hemoglobin: 12.9 g/dL (ref 12.0–15.0)
MCH: 30.9 pg (ref 26.0–34.0)
MCHC: 33.9 g/dL (ref 30.0–36.0)
MCV: 91.4 fL (ref 80.0–100.0)
Platelets: 221 10*3/uL (ref 150–400)
RBC: 4.17 MIL/uL (ref 3.87–5.11)
RDW: 13.7 % (ref 11.5–15.5)
WBC: 9.6 10*3/uL (ref 4.0–10.5)
nRBC: 0 % (ref 0.0–0.2)

## 2021-02-04 LAB — HCG, QUANTITATIVE, PREGNANCY: hCG, Beta Chain, Quant, S: 4227 m[IU]/mL — ABNORMAL HIGH (ref <5)

## 2021-02-04 LAB — TROPONIN I (HIGH SENSITIVITY): Troponin I (High Sensitivity): <2 ng/L (ref <18)

## 2021-02-04 LAB — LIPASE, BLOOD: Lipase: 33 U/L (ref 11–51)

## 2021-02-04 MED ORDER — LACTATED RINGERS IV BOLUS
1000.0000 mL | Freq: Once | INTRAVENOUS | Status: AC
  Administered 2021-02-04: 1000 mL via INTRAVENOUS

## 2021-02-04 MED ORDER — ACETAMINOPHEN 325 MG PO TABS
650.0000 mg | ORAL_TABLET | Freq: Once | ORAL | Status: AC
  Administered 2021-02-04: 650 mg via ORAL
  Filled 2021-02-04: qty 2

## 2021-02-04 MED ORDER — DIPHENHYDRAMINE HCL 25 MG PO CAPS
25.0000 mg | ORAL_CAPSULE | Freq: Once | ORAL | Status: AC
  Administered 2021-02-04: 25 mg via ORAL
  Filled 2021-02-04: qty 1

## 2021-02-04 MED ORDER — DICYCLOMINE HCL 10 MG PO CAPS
20.0000 mg | ORAL_CAPSULE | Freq: Once | ORAL | Status: AC
  Administered 2021-02-04: 20 mg via ORAL
  Filled 2021-02-04: qty 2

## 2021-02-04 MED ORDER — DROPERIDOL 2.5 MG/ML IJ SOLN
1.2500 mg | Freq: Once | INTRAMUSCULAR | Status: AC
  Administered 2021-02-04: 1.25 mg via INTRAVENOUS
  Filled 2021-02-04: qty 2

## 2021-02-04 NOTE — ED Notes (Signed)
Ultrasound in progress  

## 2021-02-04 NOTE — ED Provider Notes (Signed)
Patient received in sign out from [address] [place], [place]. Patient being evaluated for abdominal pain. Completion of diagnostic testing pending.  Results for orders placed or performed during the hospital encounter of 02/04/21  Resp Panel by RT-PCR (Flu A&B, Covid) Nasopharyngeal Swab   Specimen: Nasopharyngeal Swab; Nasopharyngeal(NP) swabs in vial transport medium  Result Value Ref Range   SARS Coronavirus 2 by RT PCR NEGATIVE NEGATIVE   Influenza A by PCR NEGATIVE NEGATIVE   Influenza B by PCR NEGATIVE NEGATIVE  Lipase, blood  Result Value Ref Range   Lipase 33 11 - 51 U/L  Comprehensive metabolic panel  Result Value Ref Range   Sodium 135 135 - 145 mmol/L   Potassium 3.5 3.5 - 5.1 mmol/L   Chloride 104 98 - 111 mmol/L   CO2 25 22 - 32 mmol/L   Glucose, Bld 92 70 - 99 mg/dL   BUN 11 6 - 20 mg/dL   Creatinine, Ser 4.09 0.44 - 1.00 mg/dL   Calcium 8.7 (L) 8.9 - 10.3 mg/dL   Total Protein 6.8 6.5 - 8.1 g/dL   Albumin 3.8 3.5 - 5.0 g/dL   AST 18 15 - 41 U/L   ALT 21 0 - 44 U/L   Alkaline Phosphatase 45 38 - 126 U/L   Total Bilirubin 0.3 0.3 - 1.2 mg/dL   GFR, Estimated >81 >19 mL/min   Anion gap 6 5 - 15  CBC  Result Value Ref Range   WBC 9.6 4.0 - 10.5 K/uL   RBC 4.17 3.87 - 5.11 MIL/uL   Hemoglobin 12.9 12.0 - 15.0 g/dL   HCT 14.7 82.9 - 56.2 %   MCV 91.4 80.0 - 100.0 fL   MCH 30.9 26.0 - 34.0 pg   MCHC 33.9 30.0 - 36.0 g/dL   RDW 13.0 86.5 - 78.4 %   Platelets 221 150 - 400 K/uL   nRBC 0.0 0.0 - 0.2 %  Urinalysis, Routine w reflex microscopic Urine, Clean Catch  Result Value Ref Range   Color, Urine YELLOW YELLOW   APPearance CLOUDY (A) CLEAR   Specific Gravity, Urine 1.020 1.005 - 1.030   pH 6.5 5.0 - 8.0   Glucose, UA NEGATIVE NEGATIVE mg/dL   Hgb urine dipstick NEGATIVE NEGATIVE   Bilirubin Urine NEGATIVE NEGATIVE   Ketones, ur NEGATIVE NEGATIVE mg/dL   Protein, ur NEGATIVE NEGATIVE mg/dL   Nitrite NEGATIVE NEGATIVE   Leukocytes,Ua NEGATIVE NEGATIVE  Pregnancy,  urine  Result Value Ref Range   Preg Test, Ur POSITIVE (A) NEGATIVE  hCG, quantitative, pregnancy  Result Value Ref Range   hCG, Beta Chain, Quant, S 4,227 (H) <5 mIU/mL  Troponin I (High Sensitivity)  Result Value Ref Range   Troponin I (High Sensitivity) <2 <18 ng/L   DG Chest 2 View  Result Date: 02/04/2021 CLINICAL DATA:  Right-sided chest pain EXAM: CHEST - 2 VIEW COMPARISON:  None. FINDINGS: The heart size and mediastinal contours are within normal limits. Both lungs are clear. The visualized skeletal structures are unremarkable. IMPRESSION: No active cardiopulmonary disease. Electronically Signed   By: Marlan Palau M.D.   On: 02/04/2021 17:54   US OB LESS THAN 14 WEEKS WITH OB TRANSVAGINAL  Result Date: 02/04/2021 CLINICAL DATA:  Abdominal pain. Unknown LMP. Quantitative beta HCG is 41,227 EXAM: OBSTETRIC <14 WK [place] AND TRANSVAGINAL OB US TECHNIQUE: Both transabdominal and transvaginal ultrasound examinations were performed for complete evaluation of the gestation as well as the maternal uterus, adnexal regions, and pelvic cul-de-sac. Transvaginal technique  was performed to assess early pregnancy. COMPARISON:  None. FINDINGS: Intrauterine gestational sac: A single intrauterine gestational sac is identified. Yolk sac:  Not Visualized. Embryo:  Not Visualized. Cardiac Activity: Not Visualized. MSD: 6.7 mm   5 w   3 d Subchorionic hemorrhage:  None visualized. Maternal uterus/adnexae: Uterus is anteverted. No myometrial mass lesions are identified. Small nabothian cysts in the cervix. Both ovaries are visualized and appear normal. Corpus luteal cyst demonstrated on the right ovary. No abnormal adnexal masses. Minimal free fluid in the pelvis. IMPRESSION: Probable early intrauterine gestational sac, but no yolk sac, fetal pole, or cardiac activity yet visualized. Recommend follow-up quantitative B-HCG levels and follow-up US in 14 days to assess viability. This recommendation follows SRU  consensus guidelines: Diagnostic Criteria for Nonviable Pregnancy Early in the First Trimester. Malva Limes Med 2013; 321:2248-25. Electronically Signed   By: Burman Nieves M.D.   On: 02/04/2021 21:43     Patient with beta quant level of 4227, consistent with pregnancy of 5 weeks. US reveals gestational sac, but no current yolk sac, fetal pole, or cardiac activity.  Patient reassessed, feels better after treatment. Patient will need to have repeat beta quant testing in 48-72 hours, and repeat ultrasound in 14 days. Patient is planning follow-up with [facility].   Felicie Morn, NP 02/04/21 2214    Benjiman Core, MD 02/04/21 [phone]

## 2021-02-04 NOTE — Discharge Instructions (Addendum)
Your pregnancy test was positive today.  Plan please follow-up with an OB/GYN I given you the information for an OB/GYN in [place].  You may follow-up with any OB/GYN that you were already established with.  You will need to have a repeat hCG level checked in 2 days.  Please have this done with your OB/GYN.  You may also go to the [facility] I have given you the information for the hospital.
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
## 2021-02-04 NOTE — ED Triage Notes (Signed)
Pt c/o lower abd pain and right side CP, n/v started last night

## 2021-02-05 ENCOUNTER — Other Ambulatory Visit: Payer: Self-pay | Admitting: *Deleted

## 2021-02-05 DIAGNOSIS — O26899 Other specified pregnancy related conditions, unspecified trimester: Secondary | ICD-10-CM

## 2021-02-05 DIAGNOSIS — O3680X0 Pregnancy with inconclusive fetal viability, not applicable or unspecified: Secondary | ICD-10-CM

## 2021-02-05 NOTE — Progress Notes (Signed)
Order for u/s requested after pt was seen at ED and noted to need repeat u/s in 14 days.  U/s ordered today.

## 2021-02-06 ENCOUNTER — Other Ambulatory Visit: Payer: Self-pay | Admitting: *Deleted

## 2021-02-06 ENCOUNTER — Ambulatory Visit (INDEPENDENT_AMBULATORY_CARE_PROVIDER_SITE_OTHER): Payer: Medicaid Other | Admitting: *Deleted

## 2021-02-06 ENCOUNTER — Other Ambulatory Visit: Payer: Self-pay

## 2021-02-06 VITALS — BP 126/77 | HR 66 | Wt 159.8 lb

## 2021-02-06 DIAGNOSIS — O3680X1 Pregnancy with inconclusive fetal viability, fetus 1: Secondary | ICD-10-CM

## 2021-02-06 DIAGNOSIS — O3680X0 Pregnancy with inconclusive fetal viability, not applicable or unspecified: Secondary | ICD-10-CM

## 2021-02-06 LAB — BETA HCG QUANT (REF LAB): hCG Quant: 5267 m[IU]/mL

## 2021-02-06 NOTE — Progress Notes (Signed)
Stat results received at 12:25pm.  Called report to 2nd attending (Dr. Jolayne Panther).  Plan is for repeat stat BETA on Sunday 02/08/21 at [facility].    Telephone call to patient, no answer, no voicemail.  Will continue to try back and will send message via MyChart.

## 2021-02-06 NOTE — Progress Notes (Signed)
Reports lower abd pain today. Pain has not increased. No vaginal bleeding. No acute distress noted. Patient notified that we will wait for results and Dr. Macon Large will determine the POC.

## 2021-02-06 NOTE — Progress Notes (Deleted)
.  stat

## 2021-02-06 NOTE — Progress Notes (Signed)
Telephone call to patient, able to connect.  Gave patient lab results and reviewed plan.    Instructed patient to go to [facility]U Sunday for repeat Stat BHCG.   Instructed patient to go directly to [facility]U if she develops increased pain and/or bleeding.    Patient understood and had no further questions.   She is not able to access her MyChart and did not have time today on the phone to walk thru resetting her password.  She will call on Monday to reset.   May have [facility] calls blocked, only able to reach her when using personal phone.

## 2021-02-06 NOTE — Progress Notes (Signed)
State beta HCG results no available. Loyce Dys, office manager will follow up for results and contact on call provider as needed. Dr. Macon Large aware.

## 2021-02-08 ENCOUNTER — Other Ambulatory Visit: Payer: Self-pay

## 2021-02-08 ENCOUNTER — Inpatient Hospital Stay (HOSPITAL_COMMUNITY)
Admission: AD | Admit: 2021-02-08 | Discharge: 2021-02-08 | Disposition: A | Discharge status: Home / Self Care | Payer: Medicaid Other | Attending: Family Medicine | Admitting: Family Medicine

## 2021-02-08 ENCOUNTER — Encounter (HOSPITAL_COMMUNITY): Payer: Self-pay | Admitting: Obstetrics and Gynecology

## 2021-02-08 DIAGNOSIS — O26899 Other specified pregnancy related conditions, unspecified trimester: Secondary | ICD-10-CM

## 2021-02-08 DIAGNOSIS — O26891 Other specified pregnancy related conditions, first trimester: Secondary | ICD-10-CM | POA: Insufficient documentation

## 2021-02-08 DIAGNOSIS — Z3A00 Weeks of gestation of pregnancy not specified: Secondary | ICD-10-CM | POA: Diagnosis not present

## 2021-02-08 DIAGNOSIS — R109 Unspecified abdominal pain: Secondary | ICD-10-CM

## 2021-02-08 LAB — HCG, QUANTITATIVE, PREGNANCY: hCG, Beta Chain, Quant, S: 12346 m[IU]/mL — ABNORMAL HIGH (ref <5)

## 2021-02-08 NOTE — MAU Note (Signed)
Pt reports to mau for follow up lab work.  Pt denies bleeding.  Reports no change in pain.  Reports she is still having the same upper abd pain she had when she was seen before.

## 2021-02-08 NOTE — Discharge Instructions (Signed)
Return to care  If you have heavier bleeding that soaks through more than 2 pads per hour for an hour or more If you bleed so much that you feel like you might pass out or you do pass out If you have significant abdominal pain that is not improved with Tylenol   

## 2021-02-10 NOTE — MAU Provider Note (Signed)
History   Chief Complaint:  Follow-up   Cynthia Kent is  30 y.o. G3P1001 No LMP recorded (lmp unknown). Patient is pregnant.. Patient is here for follow up of quantitative HCG and ongoing surveillance of pregnancy status. She is Unknown weeks gestation  by LMP.    Since her last visit, the patient is without new complaint. The patient reports bleeding as  none now.  She denies any pain.  General ROS:   pos for abdominal cramping that is unchanged from previous visit  Her previous Quantitative HCG values are: Component     Latest Ref Rng & Units 02/04/2021  HCG, Beta Chain, Quant, S     <5 mIU/mL 4,227 (H)      Physical Exam   Blood pressure 136/71, pulse 83, temperature 98.2 F (36.8 C), temperature source Oral, resp. rate 17, SpO2 100 %.  Physical Examination: General appearance - alert, well appearing, and in no distress Mental status - normal mood, behavior, speech, dress, motor activity, and thought processes Chest - normal respiratory effort  Labs: Results for orders placed or performed during the hospital encounter of 02/08/21 (from the past 48 hour(s))  hCG, quantitative, pregnancy     Status: Abnormal   Collection Time: 02/08/21  1:38 PM  Result Value Ref Range   hCG, Beta Chain, Quant, S 12,346 (H) <5 mIU/mL    Comment:          GEST. AGE      CONC.  (mIU/mL)   <=1 WEEK        5 - 50     2 WEEKS       50 - 500     3 WEEKS       100 - 10,000     4 WEEKS     1,000 - 30,000     5 WEEKS     3,500 - 115,000   6-8 WEEKS     12,000 - 270,000    12 WEEKS     15,000 - 220,000        FEMALE AND NON-PREGNANT FEMALE:     LESS THAN 5 mIU/mL Performed at [facility], [address], [place], [place] [address]      Ultrasound Studies:   No results found.  Assessment:   1. Abdominal pain during pregnancy in first trimester    -reviewed with Dr. Shawnie Pons - patient to keep viability ultrasound on 10/24   Plan: -Discharge home in stable  condition -Reviewed reasons to return to [facility] -Patient advised to follow-up with ultrasound as scheduled -Patient may return to [facility]U as needed or if her condition were to change or worsen  Judeth Horn, NP 02/10/2021, 8:18 AM

## 2021-02-16 ENCOUNTER — Telehealth: Payer: Self-pay

## 2021-02-16 ENCOUNTER — Ambulatory Visit
Admission: RE | Admit: 2021-02-16 | Discharge: 2021-02-16 | Disposition: A | Discharge status: Home / Self Care | Payer: Medicaid Other | Source: Ambulatory Visit | Attending: Obstetrics and Gynecology | Admitting: Obstetrics and Gynecology

## 2021-02-16 ENCOUNTER — Other Ambulatory Visit: Payer: Self-pay

## 2021-02-16 ENCOUNTER — Telehealth: Payer: Self-pay | Admitting: Medical

## 2021-02-16 DIAGNOSIS — O3680X0 Pregnancy with inconclusive fetal viability, not applicable or unspecified: Secondary | ICD-10-CM | POA: Insufficient documentation

## 2021-02-16 DIAGNOSIS — R109 Unspecified abdominal pain: Secondary | ICD-10-CM | POA: Insufficient documentation

## 2021-02-16 DIAGNOSIS — O26899 Other specified pregnancy related conditions, unspecified trimester: Secondary | ICD-10-CM | POA: Diagnosis not present

## 2021-02-16 MED ORDER — PRENATAL GUMMIES 0.18-25 MG PO CHEW: 2.0000 | CHEWABLE_TABLET | Freq: Every day | ORAL | 11 refills | Status: DC

## 2021-02-16 NOTE — Telephone Encounter (Signed)
I called Cynthia Kent today at 11:25 AM and confirmed patient's identity using two patient identifiers. [place] results from earlier today were reviewed. Patient is not yet scheduled for new OB visit. She plans to return to [facility]. In-basket message sent to [employer] pool to schedule. Patient requests PNV gummies sent to pharmacy. Rx sent to pharmacy of choice. First trimester warning signs reviewed. Patient voiced understanding and had no further questions.   US OB Transvaginal  Result Date: 02/16/2021 CLINICAL DATA:  Abdominal pain affecting first trimester of pregnancy, pregnancy of uncertain fetal viability, dating and viability assessment, follow-up. O26.899, R10.9, O36.80X0 EXAM: TRANSVAGINAL OB ULTRASOUND TECHNIQUE: Transvaginal ultrasound was performed for complete evaluation of the gestation as well as the maternal uterus, adnexal regions, and pelvic cul-de-sac. COMPARISON:  02/04/2021 FINDINGS: Intrauterine gestational sac: Present, single Yolk sac:  Present Embryo:  Present Cardiac Activity: Present Heart Rate: 133 bpm CRL:   9.1 mm   6 w 6 d                  [place] EDC: 10/06/2021 Subchorionic hemorrhage:  None visualized. Maternal uterus/adnexae: Uterus anteverted, otherwise unremarkable. LEFT ovary normal size and morphology, 3.6 x 1.9 x 2.5 cm. RIGHT ovary normal size and morphology 3.8 x 2.0 x 2.4 cm, with incidental corpus luteum. No free pelvic fluid or adnexal masses. IMPRESSION: Single live intrauterine gestation at 6 weeks 6 days EGA. No acute abnormalities. Electronically Signed   By: Ulyses Southward M.D.   On: 02/16/2021 11:12    Cynthia Lowenstein, PA-C 02/16/2021 11:25 AM

## 2021-03-04 ENCOUNTER — Other Ambulatory Visit: Payer: Self-pay

## 2021-03-04 ENCOUNTER — Ambulatory Visit (INDEPENDENT_AMBULATORY_CARE_PROVIDER_SITE_OTHER): Payer: Medicaid Other | Admitting: Obstetrics & Gynecology

## 2021-03-04 ENCOUNTER — Encounter: Payer: Self-pay | Admitting: Obstetrics & Gynecology

## 2021-03-04 VITALS — BP 123/76 | HR 72 | Wt 172.2 lb

## 2021-03-04 DIAGNOSIS — O26891 Other specified pregnancy related conditions, first trimester: Secondary | ICD-10-CM

## 2021-03-04 DIAGNOSIS — Z98891 History of uterine scar from previous surgery: Secondary | ICD-10-CM | POA: Diagnosis not present

## 2021-03-04 DIAGNOSIS — R0781 Pleurodynia: Secondary | ICD-10-CM

## 2021-03-04 DIAGNOSIS — R109 Unspecified abdominal pain: Secondary | ICD-10-CM

## 2021-03-04 DIAGNOSIS — Z331 Pregnant state, incidental: Secondary | ICD-10-CM | POA: Diagnosis not present
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
## 2021-03-18 ENCOUNTER — Other Ambulatory Visit: Payer: Self-pay | Admitting: *Deleted

## 2021-03-18 ENCOUNTER — Telehealth: Payer: Self-pay | Admitting: *Deleted

## 2021-03-18 NOTE — Telephone Encounter (Signed)
TC to complete New OB Intake. No answer. No VM available.

## 2021-03-25 ENCOUNTER — Other Ambulatory Visit: Payer: Self-pay

## 2021-03-25 ENCOUNTER — Ambulatory Visit (INDEPENDENT_AMBULATORY_CARE_PROVIDER_SITE_OTHER): Payer: Medicaid Other

## 2021-03-25 ENCOUNTER — Encounter: Payer: Medicaid Other | Admitting: Obstetrics and Gynecology

## 2021-03-25 VITALS — BP 121/68 | HR 66 | Wt 177.1 lb

## 2021-03-25 DIAGNOSIS — Z3A12 12 weeks gestation of pregnancy: Secondary | ICD-10-CM

## 2021-03-25 DIAGNOSIS — Z3481 Encounter for supervision of other normal pregnancy, first trimester: Secondary | ICD-10-CM

## 2021-03-25 DIAGNOSIS — Z3480 Encounter for supervision of other normal pregnancy, unspecified trimester: Secondary | ICD-10-CM

## 2021-03-25 NOTE — Progress Notes (Signed)
Pt presents for nurse visit. This is not a planned pregnancy and FOB is not involved.  Pt declined flu vaccine.

## 2021-03-26 LAB — CBC/D/PLT+RPR+RH+ABO+RUBIGG...
Antibody Screen: NEGATIVE
Basophils Absolute: 0 10*3/uL (ref 0.0–0.2)
Basos: 0 %
EOS (ABSOLUTE): 0.2 10*3/uL (ref 0.0–0.4)
Eos: 3 %
HCV Ab: <0.1 s/co ratio (ref 0.0–0.9)
HIV Screen 4th Generation wRfx: NONREACTIVE
Hematocrit: 36 % (ref 34.0–46.6)
Hemoglobin: 12.8 g/dL (ref 11.1–15.9)
Hepatitis B Surface Ag: NEGATIVE
Immature Grans (Abs): 0.1 10*3/uL (ref 0.0–0.1)
Immature Granulocytes: 1 %
Lymphocytes Absolute: 2.2 10*3/uL (ref 0.7–3.1)
Lymphs: 24 %
MCH: 32.9 pg (ref 26.6–33.0)
MCHC: 35.6 g/dL (ref 31.5–35.7)
MCV: 93 fL (ref 79–97)
Monocytes Absolute: 0.5 10*3/uL (ref 0.1–0.9)
Monocytes: 5 %
Neutrophils Absolute: 5.9 10*3/uL (ref 1.4–7.0)
Neutrophils: 67 %
Platelets: 209 10*3/uL (ref 150–450)
RBC: 3.89 x10E6/uL (ref 3.77–5.28)
RDW: 13.2 % (ref 11.7–15.4)
RPR Ser Ql: NONREACTIVE
Rh Factor: POSITIVE
Rubella Antibodies, IGG: <0.9 index — ABNORMAL LOW (ref >0.99)
WBC: 8.8 10*3/uL (ref 3.4–10.8)

## 2021-03-26 LAB — HCV INTERPRETATION

## 2021-03-30 LAB — CULTURE, OB URINE

## 2021-03-30 LAB — URINE CULTURE, OB REFLEX

## 2021-04-01 ENCOUNTER — Other Ambulatory Visit: Payer: Self-pay | Admitting: Obstetrics

## 2021-04-01 DIAGNOSIS — B951 Streptococcus, group B, as the cause of diseases classified elsewhere: Secondary | ICD-10-CM

## 2021-04-01 MED ORDER — CEFUROXIME AXETIL 500 MG PO TABS: 500.0000 mg | ORAL_TABLET | Freq: Two times a day (BID) | ORAL | 0 refills | Status: DC

## 2021-04-03 ENCOUNTER — Encounter: Payer: Self-pay | Admitting: Obstetrics

## 2021-04-07 ENCOUNTER — Encounter: Payer: Self-pay | Admitting: Obstetrics

## 2021-04-13 ENCOUNTER — Encounter: Payer: Medicaid Other | Admitting: Obstetrics and Gynecology

## 2021-05-06 ENCOUNTER — Encounter: Payer: Self-pay | Admitting: Nurse Practitioner

## 2021-05-06 ENCOUNTER — Other Ambulatory Visit: Payer: Self-pay

## 2021-05-06 ENCOUNTER — Ambulatory Visit (INDEPENDENT_AMBULATORY_CARE_PROVIDER_SITE_OTHER): Payer: Medicaid Other | Admitting: Nurse Practitioner

## 2021-05-06 VITALS — BP 109/61 | HR 63 | Wt 190.0 lb

## 2021-05-06 DIAGNOSIS — Z98891 History of uterine scar from previous surgery: Secondary | ICD-10-CM

## 2021-05-06 DIAGNOSIS — Z3482 Encounter for supervision of other normal pregnancy, second trimester: Secondary | ICD-10-CM

## 2021-05-06 DIAGNOSIS — Z3A18 18 weeks gestation of pregnancy: Secondary | ICD-10-CM

## 2021-05-06 DIAGNOSIS — Z3480 Encounter for supervision of other normal pregnancy, unspecified trimester: Secondary | ICD-10-CM

## 2021-05-06 DIAGNOSIS — R55 Syncope and collapse: Secondary | ICD-10-CM

## 2021-05-06 DIAGNOSIS — R109 Unspecified abdominal pain: Secondary | ICD-10-CM

## 2021-05-06 MED ORDER — ASPIRIN 81 MG PO TBEC: 81.0000 mg | DELAYED_RELEASE_TABLET | Freq: Every day | ORAL | 12 refills | Status: DC
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
## 2021-05-06 NOTE — Progress Notes (Signed)
Decline AFP

## 2021-05-07 ENCOUNTER — Telehealth: Payer: Self-pay

## 2021-05-07 NOTE — Telephone Encounter (Signed)
-----   Message from Virginia Rochester, NP sent at 05/06/2021  6:05 PM EST ----- Regarding: Needs to start low dose aspirin Patient was at the end of the day and I did not see her full history until after she left.  She needs to start low dose aspirin and we did not discuss it.  She told me she does not have MyChart on her phone.  So please call her to review the need for low dose aspirin as she developed high blood pressure in her last pregnancy. Thank you so much. Terri

## 2021-05-07 NOTE — Telephone Encounter (Signed)
Attempted to call patient to inform her of providers recommendation. No answer. Unable to leave vm.

## 2021-05-21 ENCOUNTER — Ambulatory Visit: Payer: Medicaid Other | Admitting: *Deleted

## 2021-05-21 ENCOUNTER — Other Ambulatory Visit: Payer: Self-pay

## 2021-05-21 ENCOUNTER — Ambulatory Visit: Payer: Medicaid Other | Attending: Nurse Practitioner

## 2021-05-21 VITALS — BP 109/62 | HR 70

## 2021-05-21 DIAGNOSIS — O358XX0 Maternal care for other (suspected) fetal abnormality and damage, not applicable or unspecified: Secondary | ICD-10-CM | POA: Diagnosis not present

## 2021-05-21 DIAGNOSIS — Z3480 Encounter for supervision of other normal pregnancy, unspecified trimester: Secondary | ICD-10-CM | POA: Diagnosis not present

## 2021-05-21 DIAGNOSIS — O34219 Maternal care for unspecified type scar from previous cesarean delivery: Secondary | ICD-10-CM | POA: Insufficient documentation

## 2021-05-21 DIAGNOSIS — O09292 Supervision of pregnancy with other poor reproductive or obstetric history, second trimester: Secondary | ICD-10-CM

## 2021-05-21 DIAGNOSIS — Z3A20 20 weeks gestation of pregnancy: Secondary | ICD-10-CM | POA: Insufficient documentation

## 2021-05-21 DIAGNOSIS — Z363 Encounter for antenatal screening for malformations: Secondary | ICD-10-CM | POA: Diagnosis present

## 2021-05-21 NOTE — Progress Notes (Signed)
145lb ?

## 2021-06-03 ENCOUNTER — Encounter: Payer: Medicaid Other | Admitting: Obstetrics and Gynecology

## 2021-06-09 ENCOUNTER — Other Ambulatory Visit: Payer: Self-pay

## 2021-06-09 ENCOUNTER — Telehealth (INDEPENDENT_AMBULATORY_CARE_PROVIDER_SITE_OTHER): Payer: Medicaid Other | Admitting: Obstetrics

## 2021-06-09 ENCOUNTER — Encounter: Payer: Self-pay | Admitting: Obstetrics

## 2021-06-09 VITALS — Wt 190.0 lb

## 2021-06-09 DIAGNOSIS — Z3A23 23 weeks gestation of pregnancy: Secondary | ICD-10-CM

## 2021-06-09 DIAGNOSIS — Z98891 History of uterine scar from previous surgery: Secondary | ICD-10-CM

## 2021-06-09 DIAGNOSIS — O2342 Unspecified infection of urinary tract in pregnancy, second trimester: Secondary | ICD-10-CM

## 2021-06-09 DIAGNOSIS — O99212 Obesity complicating pregnancy, second trimester: Secondary | ICD-10-CM

## 2021-06-09 DIAGNOSIS — Z3480 Encounter for supervision of other normal pregnancy, unspecified trimester: Secondary | ICD-10-CM

## 2021-06-09 DIAGNOSIS — O99210 Obesity complicating pregnancy, unspecified trimester: Secondary | ICD-10-CM

## 2021-06-09 DIAGNOSIS — O34219 Maternal care for unspecified type scar from previous cesarean delivery: Secondary | ICD-10-CM

## 2021-06-09 DIAGNOSIS — B951 Streptococcus, group B, as the cause of diseases classified elsewhere: Secondary | ICD-10-CM

## 2021-06-09 MED ORDER — PRENATE MINI 18-0.6-0.4-350 MG PO CAPS: 1.0000 | ORAL_CAPSULE | Freq: Every day | ORAL | 4 refills | Status: AC
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
## 2021-06-09 NOTE — Progress Notes (Signed)
ROB MyChart visit, transportation issues No unusual complaints. Needs early A1C or GTT/ stressed importance of in person visit. Needs to switch to [facility] office where she is living now

## 2021-06-16 ENCOUNTER — Other Ambulatory Visit: Payer: Medicaid Other

## 2021-07-09 ENCOUNTER — Encounter: Payer: Medicaid Other | Admitting: Family Medicine

## 2021-07-23 ENCOUNTER — Ambulatory Visit (INDEPENDENT_AMBULATORY_CARE_PROVIDER_SITE_OTHER): Payer: Medicaid Other | Admitting: Family Medicine

## 2021-07-23 VITALS — BP 98/71 | HR 86 | Wt 194.0 lb

## 2021-07-23 DIAGNOSIS — O09899 Supervision of other high risk pregnancies, unspecified trimester: Secondary | ICD-10-CM

## 2021-07-23 DIAGNOSIS — Z3480 Encounter for supervision of other normal pregnancy, unspecified trimester: Secondary | ICD-10-CM

## 2021-07-23 DIAGNOSIS — F319 Bipolar disorder, unspecified: Secondary | ICD-10-CM

## 2021-07-23 DIAGNOSIS — Z2839 Other underimmunization status: Secondary | ICD-10-CM

## 2021-07-23 DIAGNOSIS — O2342 Unspecified infection of urinary tract in pregnancy, second trimester: Secondary | ICD-10-CM | POA: Insufficient documentation

## 2021-07-23 DIAGNOSIS — B951 Streptococcus, group B, as the cause of diseases classified elsewhere: Secondary | ICD-10-CM | POA: Insufficient documentation

## 2021-07-23 DIAGNOSIS — Z98891 History of uterine scar from previous surgery: Secondary | ICD-10-CM
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
## 2021-07-24 ENCOUNTER — Other Ambulatory Visit: Payer: Medicaid Other

## 2021-07-24 DIAGNOSIS — Z3480 Encounter for supervision of other normal pregnancy, unspecified trimester: Secondary | ICD-10-CM

## 2021-07-24 DIAGNOSIS — Z3A29 29 weeks gestation of pregnancy: Secondary | ICD-10-CM

## 2021-07-24 DIAGNOSIS — O24419 Gestational diabetes mellitus in pregnancy, unspecified control: Secondary | ICD-10-CM

## 2021-07-24 NOTE — Progress Notes (Cosign Needed)
Patient complaining of not feeling the baby move yet this morning. Patient presents for her glucose test. ?Obtained fetal heart rate 130 bpm. Armandina Stammer RN  ?

## 2021-07-25 LAB — CBC
Hematocrit: 35.3 % (ref 34.0–46.6)
Hemoglobin: 12.4 g/dL (ref 11.1–15.9)
MCH: 32.3 pg (ref 26.6–33.0)
MCHC: 35.1 g/dL (ref 31.5–35.7)
MCV: 92 fL (ref 79–97)
Platelets: 222 10*3/uL (ref 150–450)
RBC: 3.84 x10E6/uL (ref 3.77–5.28)
RDW: 12.4 % (ref 11.7–15.4)
WBC: 9.5 10*3/uL (ref 3.4–10.8)

## 2021-07-25 LAB — RPR: RPR Ser Ql: NONREACTIVE

## 2021-07-25 LAB — GLUCOSE TOLERANCE, 2 HOURS W/ 1HR
Glucose, 1 hour: 199 mg/dL — ABNORMAL HIGH (ref 70–179)
Glucose, 2 hour: 157 mg/dL — ABNORMAL HIGH (ref 70–152)
Glucose, Fasting: 102 mg/dL — ABNORMAL HIGH (ref 70–91)

## 2021-07-25 LAB — HIV ANTIBODY (ROUTINE TESTING W REFLEX): HIV Screen 4th Generation wRfx: NONREACTIVE

## 2021-07-29 MED ORDER — ACCU-CHEK SOFTCLIX LANCETS MISC: 1.0000 | Freq: Four times a day (QID) | 12 refills | Status: DC

## 2021-07-29 MED ORDER — ACCU-CHEK GUIDE VI STRP: ORAL_STRIP | 12 refills | Status: DC

## 2021-07-29 MED ORDER — ACCU-CHEK GUIDE W/DEVICE KIT: 1.0000 [IU] | PACK | 0 refills | Status: DC

## 2021-07-30 ENCOUNTER — Telehealth: Payer: Self-pay

## 2021-07-30 NOTE — Telephone Encounter (Signed)
-----   Message from Truett Mainland, DO sent at 07/29/2021  4:34 PM EDT ----- ?Has GDM. I've sent testing supplies to pharmacy and put in referral for DM education. Please let pt know. ?

## 2021-07-30 NOTE — Telephone Encounter (Signed)
Called pt to inform her of Glucose test results. Left message for pt to call the office back. ?Lona Six l Aryiah Monterosso, CMA  ?

## 2021-08-06 ENCOUNTER — Encounter: Payer: Medicaid Other | Admitting: Obstetrics & Gynecology

## 2021-08-20 ENCOUNTER — Ambulatory Visit (INDEPENDENT_AMBULATORY_CARE_PROVIDER_SITE_OTHER): Payer: Medicaid Other | Admitting: Family Medicine

## 2021-08-20 VITALS — BP 118/80 | HR 98 | Wt 204.0 lb

## 2021-08-20 DIAGNOSIS — Z3480 Encounter for supervision of other normal pregnancy, unspecified trimester: Secondary | ICD-10-CM

## 2021-08-20 DIAGNOSIS — Z98891 History of uterine scar from previous surgery: Secondary | ICD-10-CM

## 2021-08-20 DIAGNOSIS — F319 Bipolar disorder, unspecified: Secondary | ICD-10-CM

## 2021-08-20 DIAGNOSIS — B951 Streptococcus, group B, as the cause of diseases classified elsewhere: Secondary | ICD-10-CM

## 2021-08-20 DIAGNOSIS — Z2839 Other underimmunization status: Secondary | ICD-10-CM

## 2021-08-20 DIAGNOSIS — O2342 Unspecified infection of urinary tract in pregnancy, second trimester: Secondary | ICD-10-CM

## 2021-08-20 DIAGNOSIS — Z3A33 33 weeks gestation of pregnancy: Secondary | ICD-10-CM

## 2021-08-20 DIAGNOSIS — O24419 Gestational diabetes mellitus in pregnancy, unspecified control: Secondary | ICD-10-CM

## 2021-08-20 DIAGNOSIS — O09899 Supervision of other high risk pregnancies, unspecified trimester: Secondary | ICD-10-CM
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
## 2021-08-21 ENCOUNTER — Encounter: Payer: Self-pay | Admitting: General Practice

## 2021-08-27 ENCOUNTER — Ambulatory Visit: Payer: Medicaid Other | Attending: Family Medicine

## 2021-08-27 ENCOUNTER — Ambulatory Visit: Payer: Medicaid Other | Admitting: *Deleted

## 2021-08-27 ENCOUNTER — Other Ambulatory Visit: Payer: Self-pay

## 2021-08-27 VITALS — BP 98/83 | HR 75

## 2021-08-27 DIAGNOSIS — Z98891 History of uterine scar from previous surgery: Secondary | ICD-10-CM | POA: Insufficient documentation

## 2021-08-27 DIAGNOSIS — O09293 Supervision of pregnancy with other poor reproductive or obstetric history, third trimester: Secondary | ICD-10-CM | POA: Insufficient documentation

## 2021-08-27 DIAGNOSIS — F319 Bipolar disorder, unspecified: Secondary | ICD-10-CM

## 2021-08-27 DIAGNOSIS — Z3A34 34 weeks gestation of pregnancy: Secondary | ICD-10-CM | POA: Diagnosis not present

## 2021-08-27 DIAGNOSIS — Z362 Encounter for other antenatal screening follow-up: Secondary | ICD-10-CM | POA: Diagnosis present

## 2021-08-27 DIAGNOSIS — O24410 Gestational diabetes mellitus in pregnancy, diet controlled: Secondary | ICD-10-CM | POA: Insufficient documentation

## 2021-08-27 DIAGNOSIS — O24419 Gestational diabetes mellitus in pregnancy, unspecified control: Secondary | ICD-10-CM | POA: Insufficient documentation

## 2021-08-27 DIAGNOSIS — Z3480 Encounter for supervision of other normal pregnancy, unspecified trimester: Secondary | ICD-10-CM | POA: Diagnosis not present

## 2021-08-27 DIAGNOSIS — O34219 Maternal care for unspecified type scar from previous cesarean delivery: Secondary | ICD-10-CM | POA: Insufficient documentation

## 2021-08-28 ENCOUNTER — Other Ambulatory Visit: Payer: Self-pay | Admitting: *Deleted

## 2021-08-28 DIAGNOSIS — O24419 Gestational diabetes mellitus in pregnancy, unspecified control: Secondary | ICD-10-CM

## 2021-09-01 ENCOUNTER — Telehealth: Payer: Self-pay

## 2021-09-02 ENCOUNTER — Other Ambulatory Visit: Payer: Self-pay

## 2021-09-02 ENCOUNTER — Inpatient Hospital Stay (HOSPITAL_COMMUNITY)
Admission: AD | Admit: 2021-09-02 | Discharge: 2021-09-02 | Disposition: A | Discharge status: Home / Self Care | Payer: Medicaid Other | Attending: Obstetrics and Gynecology | Admitting: Obstetrics and Gynecology

## 2021-09-02 ENCOUNTER — Encounter (HOSPITAL_COMMUNITY): Payer: Self-pay | Admitting: Obstetrics and Gynecology

## 2021-09-02 DIAGNOSIS — O4700 False labor before 37 completed weeks of gestation, unspecified trimester: Secondary | ICD-10-CM

## 2021-09-02 DIAGNOSIS — O4703 False labor before 37 completed weeks of gestation, third trimester: Secondary | ICD-10-CM | POA: Diagnosis not present

## 2021-09-02 DIAGNOSIS — Z3A35 35 weeks gestation of pregnancy: Secondary | ICD-10-CM | POA: Insufficient documentation

## 2021-09-02 DIAGNOSIS — O26893 Other specified pregnancy related conditions, third trimester: Secondary | ICD-10-CM | POA: Diagnosis present

## 2021-09-02 DIAGNOSIS — O26899 Other specified pregnancy related conditions, unspecified trimester: Secondary | ICD-10-CM

## 2021-09-02 HISTORY — DX: Gestational (pregnancy-induced) hypertension without significant proteinuria, unspecified trimester: O13.9

## 2021-09-02 LAB — URINALYSIS, ROUTINE W REFLEX MICROSCOPIC
Bilirubin Urine: NEGATIVE
Glucose, UA: NEGATIVE mg/dL
Hgb urine dipstick: NEGATIVE
Ketones, ur: NEGATIVE mg/dL
Leukocytes,Ua: NEGATIVE
Nitrite: NEGATIVE
Protein, ur: NEGATIVE mg/dL
Specific Gravity, Urine: 1.016 (ref 1.005–1.030)
pH: 6 (ref 5.0–8.0)

## 2021-09-02 MED ORDER — NIFEDIPINE 10 MG PO CAPS
10.0000 mg | ORAL_CAPSULE | ORAL | Status: DC | PRN
  Administered 2021-09-02: 10 mg via ORAL
  Filled 2021-09-02: qty 1

## 2021-09-02 NOTE — MAU Note (Signed)
Cynthia Kent is a 31 y.o. at [redacted]w[redacted]d here in MAU reporting: lower abdominal pain that began a few hours ago.  Reports pain is sharp & constant.  Denies VB, reports unsure if LOF.  States underwear stay moist.  Endorses +FM, but less than usual. ?Onset of complaint: today ?Pain score: 5/10 ?Vitals:  ? 09/02/21 1836  ?BP: 116/68  ?Pulse: 78  ?Resp: 20  ?Temp: 98.3 ?F (36.8 ?C)  ?SpO2: 99%  ?   ?FHT: 130bpm ?Lab orders placed from triage:   UA ?
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
## 2021-09-03 ENCOUNTER — Ambulatory Visit (INDEPENDENT_AMBULATORY_CARE_PROVIDER_SITE_OTHER): Payer: Medicaid Other | Admitting: Family Medicine

## 2021-09-03 ENCOUNTER — Ambulatory Visit: Payer: Medicaid Other | Attending: Maternal & Fetal Medicine

## 2021-09-03 ENCOUNTER — Ambulatory Visit: Payer: Medicaid Other

## 2021-09-03 VITALS — BP 110/56 | HR 87 | Wt 206.0 lb

## 2021-09-03 DIAGNOSIS — F319 Bipolar disorder, unspecified: Secondary | ICD-10-CM

## 2021-09-03 DIAGNOSIS — O2342 Unspecified infection of urinary tract in pregnancy, second trimester: Secondary | ICD-10-CM

## 2021-09-03 DIAGNOSIS — Z98891 History of uterine scar from previous surgery: Secondary | ICD-10-CM

## 2021-09-03 DIAGNOSIS — B951 Streptococcus, group B, as the cause of diseases classified elsewhere: Secondary | ICD-10-CM

## 2021-09-03 DIAGNOSIS — Z3A35 35 weeks gestation of pregnancy: Secondary | ICD-10-CM

## 2021-09-03 DIAGNOSIS — Z3480 Encounter for supervision of other normal pregnancy, unspecified trimester: Secondary | ICD-10-CM

## 2021-09-03 DIAGNOSIS — O24419 Gestational diabetes mellitus in pregnancy, unspecified control: Secondary | ICD-10-CM

## 2021-09-03 MED ORDER — METFORMIN HCL 500 MG PO TABS: 500.0000 mg | ORAL_TABLET | Freq: Every day | ORAL | 3 refills | Status: DC
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
## 2021-09-08 ENCOUNTER — Ambulatory Visit: Payer: Medicaid Other | Admitting: *Deleted

## 2021-09-08 ENCOUNTER — Ambulatory Visit: Payer: Medicaid Other | Attending: Maternal & Fetal Medicine

## 2021-09-08 ENCOUNTER — Other Ambulatory Visit: Payer: Self-pay | Admitting: *Deleted

## 2021-09-08 VITALS — BP 112/59 | HR 88

## 2021-09-08 DIAGNOSIS — O24410 Gestational diabetes mellitus in pregnancy, diet controlled: Secondary | ICD-10-CM

## 2021-09-08 DIAGNOSIS — Z3A36 36 weeks gestation of pregnancy: Secondary | ICD-10-CM

## 2021-09-08 DIAGNOSIS — Z8759 Personal history of other complications of pregnancy, childbirth and the puerperium: Secondary | ICD-10-CM

## 2021-09-08 DIAGNOSIS — O34219 Maternal care for unspecified type scar from previous cesarean delivery: Secondary | ICD-10-CM

## 2021-09-08 DIAGNOSIS — Z8632 Personal history of gestational diabetes: Secondary | ICD-10-CM | POA: Diagnosis present

## 2021-09-08 DIAGNOSIS — O24419 Gestational diabetes mellitus in pregnancy, unspecified control: Secondary | ICD-10-CM | POA: Insufficient documentation

## 2021-09-08 DIAGNOSIS — O09293 Supervision of pregnancy with other poor reproductive or obstetric history, third trimester: Secondary | ICD-10-CM | POA: Diagnosis not present

## 2021-09-10 ENCOUNTER — Other Ambulatory Visit (HOSPITAL_COMMUNITY)
Admission: RE | Admit: 2021-09-10 | Discharge: 2021-09-10 | Disposition: A | Discharge status: Home / Self Care | Payer: Medicaid Other | Source: Ambulatory Visit | Attending: Family Medicine | Admitting: Family Medicine

## 2021-09-10 ENCOUNTER — Ambulatory Visit (INDEPENDENT_AMBULATORY_CARE_PROVIDER_SITE_OTHER): Payer: Medicaid Other | Admitting: Family Medicine

## 2021-09-10 VITALS — BP 101/60 | HR 106 | Wt 211.0 lb

## 2021-09-10 DIAGNOSIS — Z3480 Encounter for supervision of other normal pregnancy, unspecified trimester: Secondary | ICD-10-CM

## 2021-09-10 DIAGNOSIS — O09899 Supervision of other high risk pregnancies, unspecified trimester: Secondary | ICD-10-CM

## 2021-09-10 DIAGNOSIS — F319 Bipolar disorder, unspecified: Secondary | ICD-10-CM

## 2021-09-10 DIAGNOSIS — B951 Streptococcus, group B, as the cause of diseases classified elsewhere: Secondary | ICD-10-CM

## 2021-09-10 DIAGNOSIS — Z2839 Other underimmunization status: Secondary | ICD-10-CM

## 2021-09-10 DIAGNOSIS — O2342 Unspecified infection of urinary tract in pregnancy, second trimester: Secondary | ICD-10-CM

## 2021-09-10 DIAGNOSIS — Z3A36 36 weeks gestation of pregnancy: Secondary | ICD-10-CM

## 2021-09-10 DIAGNOSIS — O24419 Gestational diabetes mellitus in pregnancy, unspecified control: Secondary | ICD-10-CM

## 2021-09-10 DIAGNOSIS — Z98891 History of uterine scar from previous surgery: Secondary | ICD-10-CM

## 2021-09-10 MED ORDER — GLYBURIDE 2.5 MG PO TABS: 2.5000 mg | ORAL_TABLET | Freq: Every day | ORAL | 0 refills | Status: DC
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
## 2021-09-14 ENCOUNTER — Ambulatory Visit: Payer: Medicaid Other | Attending: Maternal & Fetal Medicine

## 2021-09-14 ENCOUNTER — Ambulatory Visit: Payer: Medicaid Other | Admitting: *Deleted

## 2021-09-14 DIAGNOSIS — O24419 Gestational diabetes mellitus in pregnancy, unspecified control: Secondary | ICD-10-CM | POA: Insufficient documentation

## 2021-09-14 DIAGNOSIS — O24415 Gestational diabetes mellitus in pregnancy, controlled by oral hypoglycemic drugs: Secondary | ICD-10-CM | POA: Diagnosis not present

## 2021-09-14 DIAGNOSIS — O09293 Supervision of pregnancy with other poor reproductive or obstetric history, third trimester: Secondary | ICD-10-CM | POA: Diagnosis not present

## 2021-09-14 DIAGNOSIS — O34219 Maternal care for unspecified type scar from previous cesarean delivery: Secondary | ICD-10-CM

## 2021-09-14 DIAGNOSIS — Z3A36 36 weeks gestation of pregnancy: Secondary | ICD-10-CM

## 2021-09-15 ENCOUNTER — Telehealth (HOSPITAL_COMMUNITY): Payer: Self-pay | Admitting: *Deleted

## 2021-09-15 ENCOUNTER — Encounter (HOSPITAL_COMMUNITY): Payer: Self-pay

## 2021-09-15 LAB — GC/CHLAMYDIA PROBE AMP (~~LOC~~) NOT AT ARMC
Chlamydia: NEGATIVE
Comment: NEGATIVE
Comment: NORMAL
Neisseria Gonorrhea: NEGATIVE

## 2021-09-15 NOTE — Telephone Encounter (Signed)
Preadmission screen  

## 2021-09-15 NOTE — Patient Instructions (Signed)
Jailen Little-Faison  09/15/2021   Your procedure is scheduled on:  09/29/2021  Arrive at [place] at [employer] [address] on [address] at [facility]  and [address]. You are invited to use the FREE valet parking or use the Visitor's parking deck.  Pick up the phone at the desk and dial [phone].  Call this number if you have problems the morning of surgery: [phone]  Remember:   Do not eat food:(After Midnight) Desps de medianoche.  Do not drink clear liquids: (After Midnight) Desps de medianoche.  Take these medicines the morning of surgery with A SIP OF WATER:  none   Do not wear jewelry, make-up or nail polish.  Do not wear lotions, powders, or perfumes. Do not wear deodorant.  Do not shave 48 hours prior to surgery.  Do not bring valuables to the hospital.  [facility] is not   responsible for any belongings or valuables brought to the hospital.  Contacts, dentures or bridgework may not be worn into surgery.  Leave suitcase in the car. After surgery it may be brought to your room.  For patients admitted to the hospital, checkout time is 11:00 AM the day of              discharge.      Please read over the following fact sheets that you were given:     Preparing for Surgery

## 2021-09-16 ENCOUNTER — Encounter (HOSPITAL_COMMUNITY): Payer: Self-pay | Admitting: Obstetrics and Gynecology

## 2021-09-16 ENCOUNTER — Inpatient Hospital Stay (HOSPITAL_COMMUNITY)
Admission: AD | Admit: 2021-09-16 | Discharge: 2021-09-16 | Disposition: A | Discharge status: Home / Self Care | Payer: Medicaid Other | Attending: Obstetrics and Gynecology | Admitting: Obstetrics and Gynecology

## 2021-09-16 DIAGNOSIS — Z880 Allergy status to penicillin: Secondary | ICD-10-CM | POA: Diagnosis not present

## 2021-09-16 DIAGNOSIS — Z3A37 37 weeks gestation of pregnancy: Secondary | ICD-10-CM | POA: Insufficient documentation

## 2021-09-16 DIAGNOSIS — Z885 Allergy status to narcotic agent status: Secondary | ICD-10-CM | POA: Insufficient documentation

## 2021-09-16 DIAGNOSIS — O24415 Gestational diabetes mellitus in pregnancy, controlled by oral hypoglycemic drugs: Secondary | ICD-10-CM | POA: Insufficient documentation

## 2021-09-16 DIAGNOSIS — N898 Other specified noninflammatory disorders of vagina: Secondary | ICD-10-CM | POA: Diagnosis present

## 2021-09-16 DIAGNOSIS — Z0371 Encounter for suspected problem with amniotic cavity and membrane ruled out: Secondary | ICD-10-CM

## 2021-09-16 LAB — GLUCOSE, CAPILLARY: Glucose-Capillary: 84 mg/dL (ref 70–99)

## 2021-09-16 LAB — POCT FERN TEST: POCT Fern Test: NEGATIVE
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
## 2021-09-16 NOTE — MAU Note (Addendum)
Patient came into MAU at [redacted]w[redacted]d with c/o leaking fluid since Sunday 5/21, no big gushes of fluid, she states it is clear fluid. She is having occasional contractions, unsure of how often. Patient denies having vaginal bleeding, patient does feel fetal movement.

## 2021-09-17 ENCOUNTER — Ambulatory Visit (INDEPENDENT_AMBULATORY_CARE_PROVIDER_SITE_OTHER): Payer: Medicaid Other | Admitting: Family Medicine

## 2021-09-17 ENCOUNTER — Encounter (HOSPITAL_COMMUNITY): Payer: Self-pay

## 2021-09-17 VITALS — BP 94/66 | HR 100 | Wt 212.0 lb

## 2021-09-17 DIAGNOSIS — Z98891 History of uterine scar from previous surgery: Secondary | ICD-10-CM

## 2021-09-17 DIAGNOSIS — B951 Streptococcus, group B, as the cause of diseases classified elsewhere: Secondary | ICD-10-CM

## 2021-09-17 DIAGNOSIS — Z2839 Other underimmunization status: Secondary | ICD-10-CM

## 2021-09-17 DIAGNOSIS — O24415 Gestational diabetes mellitus in pregnancy, controlled by oral hypoglycemic drugs: Secondary | ICD-10-CM

## 2021-09-17 DIAGNOSIS — O2342 Unspecified infection of urinary tract in pregnancy, second trimester: Secondary | ICD-10-CM

## 2021-09-17 DIAGNOSIS — O09899 Supervision of other high risk pregnancies, unspecified trimester: Secondary | ICD-10-CM

## 2021-09-17 DIAGNOSIS — Z3480 Encounter for supervision of other normal pregnancy, unspecified trimester: Secondary | ICD-10-CM

## 2021-09-17 DIAGNOSIS — F319 Bipolar disorder, unspecified: Secondary | ICD-10-CM

## 2021-09-17 DIAGNOSIS — Z3A37 37 weeks gestation of pregnancy: Secondary | ICD-10-CM
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
## 2021-09-22 ENCOUNTER — Ambulatory Visit: Payer: Medicaid Other | Admitting: *Deleted

## 2021-09-22 ENCOUNTER — Ambulatory Visit: Payer: Medicaid Other | Attending: Obstetrics

## 2021-09-22 VITALS — BP 114/73 | HR 110

## 2021-09-22 DIAGNOSIS — O34219 Maternal care for unspecified type scar from previous cesarean delivery: Secondary | ICD-10-CM | POA: Diagnosis not present

## 2021-09-22 DIAGNOSIS — O09293 Supervision of pregnancy with other poor reproductive or obstetric history, third trimester: Secondary | ICD-10-CM

## 2021-09-22 DIAGNOSIS — Z8759 Personal history of other complications of pregnancy, childbirth and the puerperium: Secondary | ICD-10-CM | POA: Insufficient documentation

## 2021-09-22 DIAGNOSIS — O24415 Gestational diabetes mellitus in pregnancy, controlled by oral hypoglycemic drugs: Secondary | ICD-10-CM | POA: Diagnosis not present

## 2021-09-22 DIAGNOSIS — O24419 Gestational diabetes mellitus in pregnancy, unspecified control: Secondary | ICD-10-CM | POA: Diagnosis present

## 2021-09-22 DIAGNOSIS — Z3A38 38 weeks gestation of pregnancy: Secondary | ICD-10-CM | POA: Diagnosis not present

## 2021-09-23 ENCOUNTER — Encounter: Payer: Self-pay | Admitting: Obstetrics & Gynecology

## 2021-09-23 ENCOUNTER — Ambulatory Visit (INDEPENDENT_AMBULATORY_CARE_PROVIDER_SITE_OTHER): Payer: Medicaid Other | Admitting: Obstetrics & Gynecology

## 2021-09-23 VITALS — BP 115/72 | HR 84 | Wt 208.0 lb

## 2021-09-23 DIAGNOSIS — O24415 Gestational diabetes mellitus in pregnancy, controlled by oral hypoglycemic drugs: Secondary | ICD-10-CM

## 2021-09-23 DIAGNOSIS — O09893 Supervision of other high risk pregnancies, third trimester: Secondary | ICD-10-CM

## 2021-09-23 DIAGNOSIS — Z2839 Other underimmunization status: Secondary | ICD-10-CM

## 2021-09-23 DIAGNOSIS — O09899 Supervision of other high risk pregnancies, unspecified trimester: Secondary | ICD-10-CM

## 2021-09-23 DIAGNOSIS — F319 Bipolar disorder, unspecified: Secondary | ICD-10-CM

## 2021-09-23 DIAGNOSIS — Z98891 History of uterine scar from previous surgery: Secondary | ICD-10-CM

## 2021-09-23 DIAGNOSIS — Z3A38 38 weeks gestation of pregnancy: Secondary | ICD-10-CM

## 2021-09-23 NOTE — Progress Notes (Signed)
Patient states that MFM told her to bring up that she has a irritating bump on her c-section scar and would it affect her planned c-section. Armandina Stammer RN
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
## 2021-09-25 ENCOUNTER — Encounter: Payer: Medicaid Other | Admitting: Obstetrics and Gynecology

## 2021-09-25 NOTE — Patient Instructions (Signed)
Katerria Little-Faison  09/25/2021   Your procedure is scheduled on:  09/29/2021  Arrive at [facility] at [employer] [address] on [address] at [facility]  and [address]. You are invited to use the FREE valet parking or use the Visitor's parking deck.  Pick up the phone at the desk and dial [phone].  Call this number if you have problems the morning of surgery: [phone]  Remember:   Do not eat food:(After Midnight) Desps de medianoche.  Do not drink clear liquids: (After Midnight) Desps de medianoche.  Take these medicines the morning of surgery with A SIP OF WATER:  none   Do not wear jewelry, make-up or nail polish.  Do not wear lotions, powders, or perfumes. Do not wear deodorant.  Do not shave 48 hours prior to surgery.  Do not bring valuables to the hospital.  [facility] is not   responsible for any belongings or valuables brought to the hospital.  Contacts, dentures or bridgework may not be worn into surgery.  Leave suitcase in the car. After surgery it may be brought to your room.  For patients admitted to the hospital, checkout time is 11:00 AM the day of              discharge.      Please read over the following fact sheets that you were given:     Preparing for Surgery

## 2021-09-28 ENCOUNTER — Inpatient Hospital Stay (HOSPITAL_COMMUNITY)
Admission: RE | Admit: 2021-09-28 | Discharge: 2021-09-28 | Disposition: A | Discharge status: Home / Self Care | Payer: Medicaid Other | Source: Ambulatory Visit

## 2021-09-28 ENCOUNTER — Inpatient Hospital Stay (EMERGENCY_DEPARTMENT_HOSPITAL)
Admission: AD | Admit: 2021-09-28 | Discharge: 2021-09-28 | Disposition: A | Discharge status: Home / Self Care | Payer: Medicaid Other | Source: Home / Self Care | Attending: Family Medicine | Admitting: Family Medicine

## 2021-09-28 ENCOUNTER — Other Ambulatory Visit (HOSPITAL_COMMUNITY)
Admission: RE | Admit: 2021-09-28 | Discharge: 2021-09-28 | Disposition: A | Discharge status: Home / Self Care | Payer: Medicaid Other | Source: Ambulatory Visit | Attending: Obstetrics and Gynecology | Admitting: Obstetrics and Gynecology

## 2021-09-28 ENCOUNTER — Encounter (HOSPITAL_COMMUNITY): Payer: Self-pay | Admitting: Family Medicine

## 2021-09-28 DIAGNOSIS — Z3A38 38 weeks gestation of pregnancy: Secondary | ICD-10-CM

## 2021-09-28 DIAGNOSIS — O471 False labor at or after 37 completed weeks of gestation: Secondary | ICD-10-CM | POA: Insufficient documentation

## 2021-09-28 DIAGNOSIS — O479 False labor, unspecified: Secondary | ICD-10-CM | POA: Diagnosis not present

## 2021-09-28 DIAGNOSIS — Z98891 History of uterine scar from previous surgery: Secondary | ICD-10-CM

## 2021-09-28 LAB — CBC
HCT: 37.9 % (ref 36.0–46.0)
Hemoglobin: 12.2 g/dL (ref 12.0–15.0)
MCH: 29.6 pg (ref 26.0–34.0)
MCHC: 32.2 g/dL (ref 30.0–36.0)
MCV: 92 fL (ref 80.0–100.0)
Platelets: 219 10*3/uL (ref 150–400)
RBC: 4.12 MIL/uL (ref 3.87–5.11)
RDW: 13.5 % (ref 11.5–15.5)
WBC: 9.6 10*3/uL (ref 4.0–10.5)
nRBC: 0 % (ref 0.0–0.2)

## 2021-09-28 LAB — TYPE AND SCREEN
ABO/RH(D): O POS
Antibody Screen: NEGATIVE
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
## 2021-09-28 NOTE — MAU Note (Signed)
.  Cynthia Kent is a 31 y.o. at [redacted]w[redacted]d here in MAU reporting: contractions that started yesterday and are getting stronger. She came by EMS from her court date. Denies LOF, denies vaginal bleeding, and patient is feeling fetal movement.  Onset of complaint: 09/27/21 Pain score: 8 Vitals:   09/28/21 1150 09/28/21 1153  BP: 131/82   Pulse: 75   Resp: 20   Temp: 98.7 F (37.1 C)   SpO2: 100% 100%     FHT:140

## 2021-09-29 ENCOUNTER — Encounter (HOSPITAL_COMMUNITY): Payer: Self-pay | Admitting: Family Medicine

## 2021-09-29 ENCOUNTER — Inpatient Hospital Stay (HOSPITAL_COMMUNITY): Payer: Medicaid Other | Admitting: Anesthesiology

## 2021-09-29 ENCOUNTER — Other Ambulatory Visit: Payer: Self-pay

## 2021-09-29 ENCOUNTER — Inpatient Hospital Stay (HOSPITAL_COMMUNITY)
Admission: RE | Admit: 2021-09-29 | Discharge: 2021-10-02 | DRG: 787 | Disposition: A | Discharge status: Home / Self Care | Payer: Medicaid Other | Source: Ambulatory Visit | Attending: Family Medicine | Admitting: Family Medicine

## 2021-09-29 ENCOUNTER — Encounter (HOSPITAL_COMMUNITY)
Admission: RE | Disposition: A | Discharge status: Home / Self Care | Payer: Self-pay | Source: Ambulatory Visit | Attending: Family Medicine

## 2021-09-29 DIAGNOSIS — Z30430 Encounter for insertion of intrauterine contraceptive device: Secondary | ICD-10-CM | POA: Diagnosis not present

## 2021-09-29 DIAGNOSIS — O99824 Streptococcus B carrier state complicating childbirth: Secondary | ICD-10-CM | POA: Diagnosis present

## 2021-09-29 DIAGNOSIS — Z3A39 39 weeks gestation of pregnancy: Secondary | ICD-10-CM

## 2021-09-29 DIAGNOSIS — D62 Acute posthemorrhagic anemia: Secondary | ICD-10-CM | POA: Diagnosis not present

## 2021-09-29 DIAGNOSIS — O24425 Gestational diabetes mellitus in childbirth, controlled by oral hypoglycemic drugs: Secondary | ICD-10-CM | POA: Diagnosis present

## 2021-09-29 DIAGNOSIS — O99820 Streptococcus B carrier state complicating pregnancy: Secondary | ICD-10-CM | POA: Diagnosis not present

## 2021-09-29 DIAGNOSIS — O34211 Maternal care for low transverse scar from previous cesarean delivery: Secondary | ICD-10-CM

## 2021-09-29 DIAGNOSIS — Z880 Allergy status to penicillin: Secondary | ICD-10-CM | POA: Diagnosis not present

## 2021-09-29 DIAGNOSIS — B951 Streptococcus, group B, as the cause of diseases classified elsewhere: Secondary | ICD-10-CM | POA: Diagnosis present

## 2021-09-29 DIAGNOSIS — O164 Unspecified maternal hypertension, complicating childbirth: Secondary | ICD-10-CM

## 2021-09-29 DIAGNOSIS — Z87891 Personal history of nicotine dependence: Secondary | ICD-10-CM

## 2021-09-29 DIAGNOSIS — O9081 Anemia of the puerperium: Secondary | ICD-10-CM | POA: Diagnosis not present

## 2021-09-29 DIAGNOSIS — Z3480 Encounter for supervision of other normal pregnancy, unspecified trimester: Secondary | ICD-10-CM

## 2021-09-29 DIAGNOSIS — O24429 Gestational diabetes mellitus in childbirth, unspecified control: Secondary | ICD-10-CM

## 2021-09-29 DIAGNOSIS — M79604 Pain in right leg: Secondary | ICD-10-CM | POA: Diagnosis not present

## 2021-09-29 DIAGNOSIS — M79605 Pain in left leg: Secondary | ICD-10-CM | POA: Diagnosis not present

## 2021-09-29 DIAGNOSIS — Z8659 Personal history of other mental and behavioral disorders: Secondary | ICD-10-CM

## 2021-09-29 DIAGNOSIS — Z98891 History of uterine scar from previous surgery: Secondary | ICD-10-CM

## 2021-09-29 DIAGNOSIS — O24424 Gestational diabetes mellitus in childbirth, insulin controlled: Secondary | ICD-10-CM | POA: Diagnosis not present

## 2021-09-29 DIAGNOSIS — O24419 Gestational diabetes mellitus in pregnancy, unspecified control: Secondary | ICD-10-CM | POA: Diagnosis present

## 2021-09-29 DIAGNOSIS — O24415 Gestational diabetes mellitus in pregnancy, controlled by oral hypoglycemic drugs: Secondary | ICD-10-CM

## 2021-09-29 DIAGNOSIS — Z2839 Other underimmunization status: Secondary | ICD-10-CM

## 2021-09-29 HISTORY — PX: INTRAUTERINE DEVICE (IUD) INSERTION: SHX5877

## 2021-09-29 LAB — GLUCOSE, CAPILLARY: Glucose-Capillary: 95 mg/dL (ref 70–99)

## 2021-09-29 LAB — RPR: RPR Ser Ql: NONREACTIVE

## 2021-09-29 SURGERY — Surgical Case
Anesthesia: Spinal

## 2021-09-29 MED ORDER — IBUPROFEN 600 MG PO TABS
600.0000 mg | ORAL_TABLET | Freq: Four times a day (QID) | ORAL | Status: DC
  Administered 2021-09-30 – 2021-10-02 (×8): 600 mg via ORAL
  Filled 2021-09-29 (×9): qty 1

## 2021-09-29 MED ORDER — TETANUS-DIPHTH-ACELL PERTUSSIS 5-2.5-18.5 LF-MCG/0.5 IM SUSY: 0.5000 mL | PREFILLED_SYRINGE | Freq: Once | INTRAMUSCULAR | Status: DC

## 2021-09-29 MED ORDER — COCONUT OIL OIL: 1.0000 "application " | TOPICAL_OIL | Status: DC | PRN

## 2021-09-29 MED ORDER — ACETAMINOPHEN 10 MG/ML IV SOLN
INTRAVENOUS | Status: AC
  Filled 2021-09-29: qty 100

## 2021-09-29 MED ORDER — SENNOSIDES-DOCUSATE SODIUM 8.6-50 MG PO TABS
2.0000 | ORAL_TABLET | Freq: Every day | ORAL | Status: DC
  Administered 2021-09-30 – 2021-10-02 (×3): 2 via ORAL
  Filled 2021-09-29 (×3): qty 2

## 2021-09-29 MED ORDER — PHENYLEPHRINE HCL (PRESSORS) 10 MG/ML IV SOLN
INTRAVENOUS | Status: DC | PRN
  Administered 2021-09-29: 160 ug via INTRAVENOUS

## 2021-09-29 MED ORDER — SODIUM CHLORIDE 0.9% FLUSH: 3.0000 mL | INTRAVENOUS | Status: DC | PRN

## 2021-09-29 MED ORDER — SCOPOLAMINE 1 MG/3DAYS TD PT72
MEDICATED_PATCH | TRANSDERMAL | Status: AC
  Filled 2021-09-29: qty 1

## 2021-09-29 MED ORDER — MEASLES, MUMPS & RUBELLA VAC IJ SOLR: 0.5000 mL | Freq: Once | INTRAMUSCULAR | Status: DC

## 2021-09-29 MED ORDER — POVIDONE-IODINE 10 % EX SWAB
2.0000 | Freq: Once | CUTANEOUS | Status: AC
Start: 2021-09-29 — End: 2021-09-29
  Administered 2021-09-29: 2 via TOPICAL

## 2021-09-29 MED ORDER — SIMETHICONE 80 MG PO CHEW
80.0000 mg | CHEWABLE_TABLET | ORAL | Status: DC | PRN
  Administered 2021-10-01 (×2): 80 mg via ORAL

## 2021-09-29 MED ORDER — CEFAZOLIN SODIUM-DEXTROSE 2-4 GM/100ML-% IV SOLN
INTRAVENOUS | Status: AC
  Filled 2021-09-29: qty 100

## 2021-09-29 MED ORDER — NALOXONE HCL 0.4 MG/ML IJ SOLN: 0.4000 mg | INTRAMUSCULAR | Status: DC | PRN

## 2021-09-29 MED ORDER — DIPHENHYDRAMINE HCL 50 MG/ML IJ SOLN: 12.5000 mg | INTRAMUSCULAR | Status: DC | PRN

## 2021-09-29 MED ORDER — NALOXONE HCL 4 MG/10ML IJ SOLN: 1.0000 ug/kg/h | INTRAVENOUS | Status: DC | PRN

## 2021-09-29 MED ORDER — DIPHENHYDRAMINE HCL 25 MG PO CAPS
25.0000 mg | ORAL_CAPSULE | Freq: Four times a day (QID) | ORAL | Status: DC | PRN
  Administered 2021-09-30 – 2021-10-02 (×4): 25 mg via ORAL
  Filled 2021-09-29 (×2): qty 1

## 2021-09-29 MED ORDER — LEVONORGESTREL 20 MCG/DAY IU IUD
INTRAUTERINE_SYSTEM | INTRAUTERINE | Status: AC
  Filled 2021-09-29: qty 1

## 2021-09-29 MED ORDER — MENTHOL 3 MG MT LOZG
1.0000 | LOZENGE | OROMUCOSAL | Status: DC | PRN
Start: 2021-09-29 — End: 2021-10-02

## 2021-09-29 MED ORDER — DEXAMETHASONE SODIUM PHOSPHATE 4 MG/ML IJ SOLN
INTRAMUSCULAR | Status: DC | PRN
  Administered 2021-09-29: 8 mg via INTRAVENOUS

## 2021-09-29 MED ORDER — ACETAMINOPHEN 10 MG/ML IV SOLN
INTRAVENOUS | Status: DC | PRN
  Administered 2021-09-29: 1000 mg via INTRAVENOUS

## 2021-09-29 MED ORDER — MORPHINE SULFATE (PF) 0.5 MG/ML IJ SOLN
INTRAMUSCULAR | Status: DC | PRN
  Administered 2021-09-29: 150 ug via INTRATHECAL

## 2021-09-29 MED ORDER — SIMETHICONE 80 MG PO CHEW
80.0000 mg | CHEWABLE_TABLET | Freq: Three times a day (TID) | ORAL | Status: DC
  Administered 2021-09-29 – 2021-10-02 (×5): 80 mg via ORAL
  Filled 2021-09-29 (×6): qty 1

## 2021-09-29 MED ORDER — PRENATAL MULTIVITAMIN CH
1.0000 | ORAL_TABLET | Freq: Every day | ORAL | Status: DC
  Administered 2021-09-29 – 2021-10-02 (×4): 1 via ORAL
  Filled 2021-09-29 (×4): qty 1

## 2021-09-29 MED ORDER — DEXAMETHASONE SODIUM PHOSPHATE 4 MG/ML IJ SOLN
INTRAMUSCULAR | Status: AC
  Filled 2021-09-29: qty 2

## 2021-09-29 MED ORDER — OXYTOCIN-SODIUM CHLORIDE 30-0.9 UT/500ML-% IV SOLN
INTRAVENOUS | Status: DC | PRN
  Administered 2021-09-29: 400 mL via INTRAVENOUS

## 2021-09-29 MED ORDER — METOCLOPRAMIDE HCL 5 MG/ML IJ SOLN
INTRAMUSCULAR | Status: AC
  Filled 2021-09-29: qty 2

## 2021-09-29 MED ORDER — OXYTOCIN-SODIUM CHLORIDE 30-0.9 UT/500ML-% IV SOLN
INTRAVENOUS | Status: AC
  Filled 2021-09-29: qty 500

## 2021-09-29 MED ORDER — ONDANSETRON HCL 4 MG/2ML IJ SOLN
INTRAMUSCULAR | Status: DC | PRN
  Administered 2021-09-29: 4 mg via INTRAVENOUS

## 2021-09-29 MED ORDER — ENOXAPARIN SODIUM 60 MG/0.6ML IJ SOSY
50.0000 mg | PREFILLED_SYRINGE | INTRAMUSCULAR | Status: DC
  Administered 2021-09-30 – 2021-10-01 (×2): 50 mg via SUBCUTANEOUS
  Filled 2021-09-29 (×2): qty 0.6

## 2021-09-29 MED ORDER — FENTANYL CITRATE (PF) 100 MCG/2ML IJ SOLN
INTRAMUSCULAR | Status: DC | PRN
Start: 2021-09-29 — End: 2021-09-29
  Administered 2021-09-29: 15 ug via INTRATHECAL

## 2021-09-29 MED ORDER — OXYCODONE HCL 5 MG/5ML PO SOLN: 5.0000 mg | Freq: Once | ORAL | Status: DC | PRN

## 2021-09-29 MED ORDER — KETOROLAC TROMETHAMINE 30 MG/ML IJ SOLN
30.0000 mg | Freq: Four times a day (QID) | INTRAMUSCULAR | Status: AC
  Administered 2021-09-29 – 2021-09-30 (×3): 30 mg via INTRAVENOUS
  Filled 2021-09-29 (×3): qty 1

## 2021-09-29 MED ORDER — PROMETHAZINE HCL 25 MG/ML IJ SOLN: 6.2500 mg | INTRAMUSCULAR | Status: DC | PRN

## 2021-09-29 MED ORDER — HYDROMORPHONE HCL 1 MG/ML IJ SOLN: 0.2500 mg | INTRAMUSCULAR | Status: DC | PRN

## 2021-09-29 MED ORDER — OXYCODONE HCL 5 MG PO TABS: 5.0000 mg | ORAL_TABLET | Freq: Once | ORAL | Status: DC | PRN

## 2021-09-29 MED ORDER — KETOROLAC TROMETHAMINE 30 MG/ML IJ SOLN: 30.0000 mg | Freq: Once | INTRAMUSCULAR | Status: DC | PRN

## 2021-09-29 MED ORDER — LACTATED RINGERS IV SOLN
125.0000 mL/h | INTRAVENOUS | Status: DC
  Administered 2021-09-29: 125 mL/h via INTRAVENOUS

## 2021-09-29 MED ORDER — DIPHENHYDRAMINE HCL 25 MG PO CAPS
25.0000 mg | ORAL_CAPSULE | ORAL | Status: DC | PRN
  Administered 2021-09-29 (×2): 25 mg via ORAL
  Filled 2021-09-29 (×4): qty 1

## 2021-09-29 MED ORDER — PHENYLEPHRINE HCL-NACL 20-0.9 MG/250ML-% IV SOLN
INTRAVENOUS | Status: DC | PRN
  Administered 2021-09-29: 60 ug/min via INTRAVENOUS

## 2021-09-29 MED ORDER — PHENYLEPHRINE 80 MCG/ML (10ML) SYRINGE FOR IV PUSH (FOR BLOOD PRESSURE SUPPORT)
PREFILLED_SYRINGE | INTRAVENOUS | Status: AC
  Filled 2021-09-29: qty 10

## 2021-09-29 MED ORDER — ONDANSETRON HCL 4 MG/2ML IJ SOLN
INTRAMUSCULAR | Status: AC
  Filled 2021-09-29: qty 2

## 2021-09-29 MED ORDER — CEFAZOLIN SODIUM-DEXTROSE 2-4 GM/100ML-% IV SOLN
2.0000 g | INTRAVENOUS | Status: AC
Start: 2021-09-29 — End: 2021-09-29
  Administered 2021-09-29: 2 g via INTRAVENOUS

## 2021-09-29 MED ORDER — SCOPOLAMINE 1 MG/3DAYS TD PT72
MEDICATED_PATCH | TRANSDERMAL | Status: DC | PRN
  Administered 2021-09-29: 1 via TRANSDERMAL

## 2021-09-29 MED ORDER — WITCH HAZEL-GLYCERIN EX PADS: 1.0000 "application " | MEDICATED_PAD | CUTANEOUS | Status: DC | PRN

## 2021-09-29 MED ORDER — OXYCODONE HCL 5 MG PO TABS
5.0000 mg | ORAL_TABLET | ORAL | Status: DC | PRN
  Administered 2021-09-30 (×2): 5 mg via ORAL
  Administered 2021-09-30 – 2021-10-02 (×7): 10 mg via ORAL
  Filled 2021-09-29: qty 2
  Filled 2021-09-29 (×2): qty 1
  Filled 2021-09-29 (×3): qty 2
  Filled 2021-09-29 (×2): qty 1
  Filled 2021-09-29 (×2): qty 2

## 2021-09-29 MED ORDER — LEVONORGESTREL 20 MCG/DAY IU IUD
1.0000 | INTRAUTERINE_SYSTEM | Freq: Once | INTRAUTERINE | Status: AC
  Administered 2021-09-29: 1 via INTRAUTERINE

## 2021-09-29 MED ORDER — DIBUCAINE (PERIANAL) 1 % EX OINT: 1.0000 "application " | TOPICAL_OINTMENT | CUTANEOUS | Status: DC | PRN

## 2021-09-29 MED ORDER — LEVONORGESTREL 20.1 MCG/DAY IU IUD
1.0000 | INTRAUTERINE_SYSTEM | INTRAUTERINE | Status: DC
  Filled 2021-09-29: qty 1

## 2021-09-29 MED ORDER — BUPIVACAINE IN DEXTROSE 0.75-8.25 % IT SOLN
INTRATHECAL | Status: DC | PRN
  Administered 2021-09-29: 1.6 mL via INTRATHECAL

## 2021-09-29 MED ORDER — LACTATED RINGERS IV SOLN: INTRAVENOUS | Status: DC

## 2021-09-29 MED ORDER — MEPERIDINE HCL 25 MG/ML IJ SOLN: 6.2500 mg | INTRAMUSCULAR | Status: DC | PRN

## 2021-09-29 MED ORDER — MORPHINE SULFATE (PF) 0.5 MG/ML IJ SOLN
INTRAMUSCULAR | Status: AC
  Filled 2021-09-29: qty 10

## 2021-09-29 MED ORDER — PHENYLEPHRINE HCL-NACL 20-0.9 MG/250ML-% IV SOLN
INTRAVENOUS | Status: AC
  Filled 2021-09-29: qty 250

## 2021-09-29 MED ORDER — OXYTOCIN-SODIUM CHLORIDE 30-0.9 UT/500ML-% IV SOLN
2.5000 [IU]/h | INTRAVENOUS | Status: AC
  Administered 2021-09-29 (×2): 2.5 [IU]/h via INTRAVENOUS

## 2021-09-29 MED ORDER — ACETAMINOPHEN 500 MG PO TABS
1000.0000 mg | ORAL_TABLET | Freq: Four times a day (QID) | ORAL | Status: DC
  Administered 2021-10-01 – 2021-10-02 (×4): 1000 mg via ORAL
  Filled 2021-09-29 (×7): qty 2

## 2021-09-29 MED ORDER — FENTANYL CITRATE (PF) 100 MCG/2ML IJ SOLN
INTRAMUSCULAR | Status: AC
  Filled 2021-09-29: qty 2

## 2021-09-29 SURGICAL SUPPLY — 36 items
BENZOIN TINCTURE PRP APPL 2/3 (GAUZE/BANDAGES/DRESSINGS) ×2 IMPLANT
CHLORAPREP W/TINT 26ML (MISCELLANEOUS) ×4 IMPLANT
CLAMP CORD UMBIL (MISCELLANEOUS) ×2 IMPLANT
CLOTH BEACON ORANGE TIMEOUT ST (SAFETY) ×2 IMPLANT
DERMABOND ADVANCED (GAUZE/BANDAGES/DRESSINGS) ×2
DERMABOND ADVANCED .7 DNX12 (GAUZE/BANDAGES/DRESSINGS) IMPLANT
DRSG OPSITE POSTOP 4X10 (GAUZE/BANDAGES/DRESSINGS) ×2 IMPLANT
ELECT REM PT RETURN 9FT ADLT (ELECTROSURGICAL) ×2
ELECTRODE REM PT RTRN 9FT ADLT (ELECTROSURGICAL) ×1 IMPLANT
EXTRACTOR VACUUM M CUP 4 TUBE (SUCTIONS) IMPLANT
GLOVE BIOGEL PI IND STRL 7.0 (GLOVE) ×2 IMPLANT
GLOVE BIOGEL PI IND STRL 7.5 (GLOVE) ×2 IMPLANT
GLOVE BIOGEL PI INDICATOR 7.0 (GLOVE) ×2
GLOVE BIOGEL PI INDICATOR 7.5 (GLOVE) ×2
GLOVE ECLIPSE 7.5 STRL STRAW (GLOVE) ×2 IMPLANT
GOWN STRL REUS W/TWL LRG LVL3 (GOWN DISPOSABLE) ×6 IMPLANT
KIT ABG SYR 3ML LUER SLIP (SYRINGE) IMPLANT
NDL HYPO 25X5/8 SAFETYGLIDE (NEEDLE) IMPLANT
NEEDLE HYPO 25X5/8 SAFETYGLIDE (NEEDLE) IMPLANT
NS IRRIG 1000ML POUR BTL (IV SOLUTION) ×2 IMPLANT
PACK C SECTION WH (CUSTOM PROCEDURE TRAY) ×2 IMPLANT
PAD OB MATERNITY 4.3X12.25 (PERSONAL CARE ITEMS) ×2 IMPLANT
RTRCTR C-SECT PINK 25CM LRG (MISCELLANEOUS) ×2 IMPLANT
STRIP CLOSURE SKIN 1/2X4 (GAUZE/BANDAGES/DRESSINGS) ×2 IMPLANT
SUT MNCRL 0 VIOLET CTX 36 (SUTURE) ×2 IMPLANT
SUT MONOCRYL 0 CTX 36 (SUTURE) ×4
SUT PLAIN 2 0 (SUTURE) ×2
SUT PLAIN ABS 2-0 CT1 27XMFL (SUTURE) IMPLANT
SUT VIC AB 0 CTX 36 (SUTURE) ×6
SUT VIC AB 0 CTX36XBRD ANBCTRL (SUTURE) ×3 IMPLANT
SUT VIC AB 2-0 CT1 27 (SUTURE) ×2
SUT VIC AB 2-0 CT1 TAPERPNT 27 (SUTURE) ×1 IMPLANT
SUT VIC AB 4-0 KS 27 (SUTURE) ×2 IMPLANT
TOWEL OR 17X24 6PK STRL BLUE (TOWEL DISPOSABLE) ×2 IMPLANT
TRAY FOLEY W/BAG SLVR 14FR LF (SET/KITS/TRAYS/PACK) ×2 IMPLANT
WATER STERILE IRR 1000ML POUR (IV SOLUTION) ×2 IMPLANT

## 2021-09-29 NOTE — Anesthesia Preprocedure Evaluation (Signed)
Anesthesia Evaluation  Patient identified by MRN, date of birth, ID band Patient awake    Reviewed: Allergy & Precautions, H&P , NPO status , Patient's Chart, lab work & pertinent test results  Airway Mallampati: II  TM Distance: >3 FB Neck ROM: full    Dental no notable dental hx.    Pulmonary sleep apnea , former smoker,    Pulmonary exam normal breath sounds clear to auscultation       Cardiovascular hypertension, Pt. on medications negative cardio ROS Normal cardiovascular exam Rhythm:Regular Rate:Normal     Neuro/Psych  Headaches, Anxiety Depression Bipolar Disorder    GI/Hepatic Neg liver ROS, GERD  ,  Endo/Other  negative endocrine ROSdiabetes, Gestational  Renal/GU negative Renal ROS     Musculoskeletal   Abdominal   Peds  Hematology negative hematology ROS (+)   Anesthesia Other Findings   Reproductive/Obstetrics (+) Pregnancy                             Anesthesia Physical  Anesthesia Plan  ASA: III  Anesthesia Plan: Spinal   Post-op Pain Management:    Induction:   PONV Risk Score and Plan: 2 and Treatment may vary due to age or medical condition  Airway Management Planned: Natural Airway  Additional Equipment:   Intra-op Plan:   Post-operative Plan:   Informed Consent: I have reviewed the patients History and Physical, chart, labs and discussed the procedure including the risks, benefits and alternatives for the proposed anesthesia with the patient or authorized representative who has indicated his/her understanding and acceptance.       Plan Discussed with:   Anesthesia Plan Comments:         Anesthesia Quick Evaluation

## 2021-09-29 NOTE — Transfer of Care (Signed)
Immediate Anesthesia Transfer of Care Note  Patient: Cynthia Kent  Procedure(s) Performed: CESAREAN SECTION INTRAUTERINE DEVICE (IUD) INSERTION  Patient Location: PACU  Anesthesia Type:Spinal  Level of Consciousness: awake, alert  and oriented  Airway & Oxygen Therapy: Patient Spontanous Breathing  Post-op Assessment: Report given to RN and Post -op Vital signs reviewed and stable  Post vital signs: Reviewed and stable  Last Vitals:  Vitals Value Taken Time  BP 109/61 09/29/21 1037  Temp    Pulse 69 09/29/21 1039  Resp 12 09/29/21 1039  SpO2 93 % 09/29/21 1039  Vitals shown include unvalidated device data.  Last Pain: There were no vitals filed for this visit.       Complications: No notable events documented.

## 2021-09-29 NOTE — Op Note (Signed)
Cynthia Kent  PROCEDURE DATE: 09/29/2021  PREOPERATIVE DIAGNOSES: Intrauterine pregnancy at [redacted]w[redacted]d weeks gestation; history of cesarean section x 2; gestational diabetes; desire for contraception   POSTOPERATIVE DIAGNOSES: The same  PROCEDURE: Repeat Low Transverse Cesarean Section and Post-Placental Mirena IUD placement   SURGEON:  Dr. Candelaria Celeste  ASSISTANT:  Dr. Evalina Field   An experienced assistant was required given the standard of surgical care given the complexity of the case.  This assistant was needed for exposure, dissection, suctioning, retraction, instrument exchange, assisting with delivery with administration of fundal pressure, and for overall help during the procedure.  ANESTHESIOLOGY TEAM: Anesthesiologist: Lowella Curb, MD CRNA: Graciela Husbands, CRNA  INDICATIONS: Cynthia Kent is a 31 y.o. [phone] at [redacted]w[redacted]d here for cesarean section secondary to the indications listed under preoperative diagnoses; please see preoperative note for further details.  The risks of cesarean section were discussed with the patient including but were not limited to: bleeding which may require transfusion or reoperation; infection which may require antibiotics; injury to bowel, bladder, ureters or other surrounding organs; injury to the fetus; need for additional procedures including hysterectomy in the event of a life-threatening hemorrhage; placental abnormalities wth subsequent pregnancies, incisional problems, thromboembolic phenomenon and other postoperative/anesthesia complications.   The patient concurred with the proposed plan, giving informed written consent for the procedure.    FINDINGS:  Viable female infant in cephalic presentation, direct OP position.  Apgars 9 and 9.  Clear amniotic fluid.  Intact placenta, three vessel cord.  Normal uterus.  Moderate adhesive disease between fascia and underlying rectus muscles.  Minimal adhesive disease over anterior aspect of  the uterus.   ANESTHESIA: Spinal  INTRAVENOUS FLUIDS: 2000 ml   ESTIMATED BLOOD LOSS: 502 ml URINE OUTPUT:  250 ml SPECIMENS: Placenta sent to L&D  COMPLICATIONS: None immediate  PROCEDURE IN DETAIL:   The patient preoperatively received intravenous antibiotics and had sequential compression devices applied to her lower extremities.  She was then taken to the operating room where spinal anesthesia was administered and was found to be adequate. She was then placed in a dorsal supine position with a leftward tilt, and prepped and draped in a sterile manner.  A foley catheter was placed into her bladder and attached to constant gravity.    After an adequate timeout was performed, a Pfannenstiel skin incision was made with scalpel on her preexisting scar and carried through to the underlying layer of fascia. The fascia was incised in the midline, and this incision was extended bilaterally bluntly. Kocher clamps were applied to the superior aspect of the fascial incision and the underlying rectus muscles were dissected off bluntly and sharply. The rectus muscles were separated in the midline and the peritoneum was entered bluntly. The Alexis self-retaining retractor was introduced into the abdominal cavity.    Attention was turned to the lower uterine segment where a low transverse hysterotomy was made with a scalpel and extended bilaterally bluntly.  Difficulty was encounter delivering fetal head through hysterotomy.  A Kiwi vacuum was placed to assist with delivery with 2 pop offs total.  The infant was successfully delivered, the cord was clamped and cut after one minute, and the infant was handed over to the awaiting neonatology team. Uterine massage was then administered, and the placenta delivered intact with a three-vessel cord. The uterus was then cleared of clots and debris.    The Mirena IUD was then placed in the fundal region, and the strings were pushed through the lower  uterine segment into  the cervix and upper vagina.  The hysterotomy was then closed with 0 Monocryl in a running locked fashion.  The pelvis was cleared of all clot and debris. Hemostasis was confirmed on all surfaces.  The retractor was removed.    The peritoneum was closed with a 2-0 Vicryl running stitch.  The fascia was then closed using 0 Vicryl in a running fashion.  The subcutaneous layer was irrigated, re-approximated with a 2-0 plain gut running stitch, and the skin was closed with a 4-0 Vicryl subcuticular stitch. The patient tolerated the procedure well. Sponge, instrument and needle counts were correct x 3.  She was taken to the recovery room in stable condition.   Evalina Field, MD  OB Fellow  Faculty [facility]

## 2021-09-29 NOTE — Discharge Summary (Signed)
Postpartum Discharge Summary      Patient Name: Cynthia Kent DOB: [birth year] MRN: [id number]  Date of admission: 09/29/2021 Delivery date:09/29/2021  Delivering provider: Truett Mainland  Date of discharge: 10/02/2021  Admitting diagnosis: Status post repeat low transverse cesarean section [Z98.891] Intrauterine pregnancy: [redacted]w[redacted]d     Secondary diagnosis:  Principal Problem:   Status post repeat low transverse cesarean section Active Problems:   History of C-section   Rubella non-immune status, antepartum   Gestational diabetes mellitus (GDM) in third trimester   Supervision of other normal pregnancy, antepartum   GBS (group b Streptococcus) UTI complicating pregnancy, second trimester   History of mood disorder   Encounter for IUD insertion  Additional problems: Acute blood loss anemia     Discharge diagnosis: Term Pregnancy Delivered                                              Post partum procedures: Post-placental Mirena IUD placement  Augmentation: N/A Complications: None  Hospital course: Sceduled C/S   31 y.o. yo G3P3003 at [redacted]w[redacted]d was admitted to the hospital 09/29/2021 for scheduled cesarean section with the following indication: History of CS x 2 and GDM.  Delivery details are as follows:  Membrane Rupture Time/Date: 9:37 AM ,09/29/2021   Delivery Method:C-Section, Vacuum Assisted  Details of operation can be found in separate operative note.  Patient had an uncomplicated postpartum course.  Her Hgb on POD# 1 was 10.3 Her fasting CBG was 94.  She is ambulating, tolerating a regular diet, passing flatus, and urinating well. Her pain and bleeding are controlled.  She is feeding well.  Patient is discharged home in stable condition on  10/02/21        Newborn Data: Birth date:09/29/2021  Birth time:9:41 AM  Gender:Female  Living status:Living  Apgars:9 ,9  Weight:3950 g     Magnesium Sulfate received: No BMZ received: No Rhophylac: N/A MMR: Offered postpartum   T-DaP: Offered postpartum  Flu: No Transfusion: No  Physical exam  Vitals:   10/01/21 0531 10/01/21 1324 10/01/21 1941 10/02/21 0540  BP: 109/75 125/68 121/71 101/71  Pulse: 68 75 63 66  Resp: $Remo'17  18 19  'IofPP$ Temp: 97.8 F (36.6 C)  98.5 F (36.9 C) 98.2 F (36.8 C)  TempSrc: Oral  Oral Oral  SpO2:      Weight:      Height:       General: alert, cooperative, and no distress Lochia: appropriate Uterine Fundus: firm Incision: N/A DVT Evaluation: No evidence of DVT seen on physical exam.  Labs: Lab Results  Component Value Date   WBC 14.2 (H) 09/30/2021   HGB 10.3 (L) 09/30/2021   HCT 30.8 (L) 09/30/2021   MCV 88.3 09/30/2021   PLT 213 09/30/2021      Latest Ref Rng & Units 09/30/2021    5:35 AM  CMP  Creatinine 0.44 - 1.00 mg/dL 0.80    Edinburgh Score:    09/29/2021    4:30 PM  Edinburgh Postnatal Depression Scale Screening Tool  I have been able to laugh and see the funny side of things. 0  I have looked forward with enjoyment to things. 2  I have blamed myself unnecessarily when things went wrong. 2  I have been anxious or worried for no good reason. 2  I have felt scared or panicky  for no good reason. 1  Things have been getting on top of me. 1  I have been so unhappy that I have had difficulty sleeping. 2  I have felt sad or miserable. 1  I have been so unhappy that I have been crying. 1  The thought of harming myself has occurred to me. 0  Edinburgh Postnatal Depression Scale Total 12     After visit meds:  Allergies as of 10/02/2021       Reactions   Amoxicillin Hives, Other (See Comments)   Has patient had a PCN reaction causing immediate rash, facial/tongue/throat swelling, SOB or lightheadedness with hypotension: No Has patient had a PCN reaction causing severe rash involving mucus membranes or skin necrosis: No Has patient had a PCN reaction that required hospitalization No Has patient had a PCN reaction occurring within the last 10 years: No If all  of the above answers are "NO", then may proceed with Cephalosporin use.   Hydrocodone Itching   Latex Swelling, Rash        Medication List     STOP taking these medications    Accu-Chek Guide test strip Generic drug: glucose blood   Accu-Chek Guide w/Device Kit   Accu-Chek Softclix Lancets lancets   glyBURIDE 2.5 MG tablet Commonly known as: DIABETA       TAKE these medications    ibuprofen 600 MG tablet Commonly known as: ADVIL Take 1 tablet (600 mg total) by mouth every 6 (six) hours.   oxyCODONE 5 MG immediate release tablet Commonly known as: Oxy IR/ROXICODONE Take 1 tablet (5 mg total) by mouth every 4 (four) hours as needed for severe pain.   Prenate Mini 18-0.6-0.4-350 MG Caps Take 1 tablet by mouth daily before breakfast.   ZOLOFT PO Take by mouth.         Discharge home in stable condition Infant Feeding: Breast Infant Disposition: home with mother Discharge instruction: per After Visit Summary and Postpartum booklet. Activity: Advance as tolerated. Pelvic rest for 6 weeks.  Diet: routine diet Future Appointments: Future Appointments  Date Time Provider [place]  10/14/2021  2:15 PM [place] [facility]  10/15/2021  3:10 PM Truett Mainland, DO [facility] None  11/26/2021  8:15 AM Nehemiah Settle Tanna Savoy, DO [facility] None   Follow up Visit: Message sent to [facility] by Dr. Gwenlyn Perking on 09/29/21.   Please schedule this patient for a In person postpartum visit in 6 weeks with the following provider: Nehemiah Settle, MD. Additional Postpartum F/U: Postpartum Depression checkup, 2 hour GTT, and Incision check 1 week  High risk pregnancy complicated by: GDM and history of CS x 2 Delivery mode:  C-Section, Vacuum Assisted  Anticipated Birth Control:  PP IUD placed  10/02/2021 Hansel Feinstein, CNM

## 2021-09-29 NOTE — Anesthesia Procedure Notes (Signed)
Spinal  Patient location during procedure: OB Start time: 09/29/2021 9:04 AM End time: 09/29/2021 9:09 AM Reason for block: surgical anesthesia Staffing Performed: anesthesiologist  Anesthesiologist: Lowella Curb, MD Preanesthetic Checklist Completed: patient identified, IV checked, risks and benefits discussed, surgical consent, monitors and equipment checked, pre-op evaluation and timeout performed Spinal Block Patient position: sitting Prep: DuraPrep and site prepped and draped Patient monitoring: heart rate, cardiac monitor, continuous pulse ox and blood pressure Approach: midline Location: L3-4 Injection technique: single-shot Needle Needle type: Pencan  Needle gauge: 24 G Needle length: 10 cm Assessment Sensory level: T4 Events: CSF return

## 2021-09-29 NOTE — Lactation Note (Signed)
This note was copied from a baby's chart. Lactation Consultation Note Mom had baby on the breast laying in her lap on a pillow BF when LC entered rm. Mom stated baby is wanting to BF a lot. LC repositioned baby's body alignment more towards mom and placed baby closer to mom. Discussed positioning, support, props, newborn feeding habits, STS, I&O, supply and demand. Answered the few questions mom had. Praised mom for great BF. Encouraged to call for assistance.  Patient Name: Cynthia Kent [id number] Date: 09/29/2021 Reason for consult: Initial assessment;Maternal endocrine disorder;Term Age:102 hours  Maternal Data Does the patient have breastfeeding experience prior to this delivery?: Yes How long did the patient breastfeed?: 2 months to her 31 yr old and 4 months to her 31 yr old.  Feeding    LATCH Score Latch: Grasps breast easily, tongue down, lips flanged, rhythmical sucking.  Audible Swallowing: A few with stimulation  Type of Nipple: Everted at rest and after stimulation  Comfort (Breast/Nipple): Soft / non-tender  Hold (Positioning): Assistance needed to correctly position infant at breast and maintain latch.  LATCH Score: 8   Lactation Tools Discussed/Used    Interventions Interventions: Breast feeding basics reviewed;Skin to skin;Breast massage;Breast compression;Adjust position;Support pillows;Position options;LC Services brochure  Discharge    Consult Status Consult Status: Follow-up Date: 09/30/21 Follow-up type: In-patient    Charyl Dancer 09/29/2021, 8:53 PM

## 2021-09-29 NOTE — Anesthesia Postprocedure Evaluation (Signed)
Anesthesia Post Note  Patient: Cynthia Kent  Procedure(s) Performed: CESAREAN SECTION INTRAUTERINE DEVICE (IUD) INSERTION     Patient location during evaluation: PACU Anesthesia Type: Spinal Level of consciousness: awake and alert Pain management: pain level controlled Vital Signs Assessment: post-procedure vital signs reviewed and stable Respiratory status: spontaneous breathing, nonlabored ventilation and respiratory function stable Cardiovascular status: blood pressure returned to baseline and stable Postop Assessment: no apparent nausea or vomiting Anesthetic complications: no   No notable events documented.  Last Vitals:  Vitals:   09/29/21 1100 09/29/21 1115  BP: 95/69 101/66  Pulse: 73 63  Resp: 19 14  Temp:    SpO2: 91% 96%    Last Pain:  Vitals:   09/29/21 1045  TempSrc: Oral  PainSc:    Pain Goal:      LLE Sensation: Tingling (09/29/21 1038)   RLE Sensation: Tingling (09/29/21 1038) L Sensory Level: L1-Inguinal (groin) region (09/29/21 1038) R Sensory Level: L1-Inguinal (groin) region (09/29/21 1038) Epidural/Spinal Function Cutaneous sensation: Tingles (09/29/21 1038), Patient able to flex knees: No (09/29/21 1038), Patient able to lift hips off bed: No (09/29/21 1038), Back pain beyond tenderness at insertion site: No (09/29/21 1038), Progressively worsening motor and/or sensory loss: No (09/29/21 1038), Bowel and/or bladder incontinence post epidural: No (09/29/21 1038)  Lowella Curb

## 2021-09-30 LAB — GLUCOSE, CAPILLARY: Glucose-Capillary: 94 mg/dL (ref 70–99)

## 2021-09-30 LAB — BIRTH TISSUE RECOVERY COLLECTION (PLACENTA DONATION)

## 2021-09-30 LAB — CBC
HCT: 30.8 % — ABNORMAL LOW (ref 36.0–46.0)
Hemoglobin: 10.3 g/dL — ABNORMAL LOW (ref 12.0–15.0)
MCH: 29.5 pg (ref 26.0–34.0)
MCHC: 33.4 g/dL (ref 30.0–36.0)
MCV: 88.3 fL (ref 80.0–100.0)
Platelets: 213 10*3/uL (ref 150–400)
RBC: 3.49 MIL/uL — ABNORMAL LOW (ref 3.87–5.11)
RDW: 13.4 % (ref 11.5–15.5)
WBC: 14.2 10*3/uL — ABNORMAL HIGH (ref 4.0–10.5)
nRBC: 0 % (ref 0.0–0.2)

## 2021-09-30 LAB — CREATININE, SERUM
Creatinine, Ser: 0.8 mg/dL (ref 0.44–1.00)
GFR, Estimated: >60 mL/min (ref >60)

## 2021-09-30 NOTE — Progress Notes (Signed)
POSTPARTUM PROGRESS NOTE  Post Operative Day 1  Subjective:  Cynthia Kent is a 31 y.o. G3P3003 s/p rLTCS at [redacted]w[redacted]d.  No acute events overnight.  Patient denies problems with ambulating, voiding or PO intake.  She denies nausea or vomiting.  Pain is poorly controlled per patient. Pain is 9/10 and is from her pelvis and incision. She has had flatus. She has not had bowel movement. Bleeding is similar to a period. She also reports whole body itching not resolved with Benadryl since after delivery. She endorses a 7/10 headache since midnight 6/6 when she began fasting for her surgery. She declines MMR and accepts Tdap.  Objective: Blood pressure 95/64, pulse 71, temperature 97.9 F (36.6 C), temperature source Oral, resp. rate 16, height $RemoveBe'5\' 5"'hRqlPptxO$  (1.651 m), weight 94.3 kg, SpO2 99 %, unknown if currently breastfeeding.  Physical Exam:  General: alert, cooperative and no distress Resp: normal work of breathing on room air Heart: normal rate, warm and well perfused Abdomen: soft, nontender Uterine Fundus: firm and below umbilicus Extremities: No LE edema. Bilateral calf tenderness to palpation present Skin: warm, dry; incision is clean, dry, and intact  Recent Labs    09/28/21 1429 09/30/21 0535  HGB 12.2 10.3*  HCT 37.9 30.8*    Assessment/Plan: Cynthia Kent is a 31 y.o. G3P3003 s/p rLTCS at [redacted]w[redacted]d   POD#1: VSS. Ask for oxycodone PRN if pain is not well controlled. Hgb 10.3 from 12.2 today.  Rubella non-immune: Declines MMR  A2GDM: CBG 94. Plan for GTT at [facility] visit  Itching: Benadryl q6hrs PRN   Contraception: IUD placed PP  Circumcision: yes, consented  Feeding: breast  Dispo: Plan for discharge on POD2   LOS: 1 day   Leonette Nutting, Medical Student 09/30/2021, 7:39 AM

## 2021-09-30 NOTE — BH Specialist Note (Signed)
Integrated Behavioral Health via Telemedicine Visit  10/14/2021 Cynthia Kent [id number]  Number of [place] Clinician visits: 1- Initial Visit  Session Start time: [id number]   Session End time: [id number]  Total time in minutes: 30   Referring Provider: Lorn Junes Patient/Family location: [facility] Provider location: [facility] [place] [facility]  All persons participating in visit: Patient Cynthia Kent and [place]   Types of Service: Individual psychotherapy and Video visit  I connected with Cynthia Kent and/or Cynthia Kent's  n/a  via  Telephone or Geologist, engineering  (Video is Caregility application) and verified that I am speaking with the correct person using two identifiers. Discussed confidentiality: Yes   I discussed the limitations of telemedicine and the availability of in person appointments.  Discussed there is a possibility of technology failure and discussed alternative modes of communication if that failure occurs.  I discussed that engaging in this telemedicine visit, they consent to the provision of behavioral healthcare and the services will be billed under their insurance.  Patient and/or legal guardian expressed understanding and consented to Telemedicine visit: Yes   Presenting Concerns: Patient and/or family reports the following symptoms/concerns: Adjusting to new motherhood with 3 children as single mother; pt's mother is her greatest support, along with peer support; not taking BH medication for ADHD or bipolar disorder. Duration of problem: Ongoing; Severity of problem:  moderately severe  Patient and/or Family's Strengths/Protective Factors: Social connections, Concrete supports in place (healthy food, safe environments, etc.), and Sense of purpose  Goals Addressed: Patient will:  Reduce symptoms of: anxiety, depression, and stress    Increase knowledge and/or ability of: healthy habits   Demonstrate ability to: Increase healthy adjustment to current life circumstances and Increase adequate support systems for patient/family  Progress towards Goals: Ongoing  Interventions: Interventions utilized:  Functional Assessment of ADLs, Psychoeducation and/or Health Education, and Supportive Reflection Standardized Assessments completed: GAD-7 and PHQ 9  Patient and/or Family Response: Patient agrees with treatment plan.   Assessment: Patient currently experiencing Bipolar affective disorder, PTSD, ADHD (all previously diagnosed).   Patient may benefit from psychoeducation and brief therapeutic interventions regarding coping with symptoms of anxiety and depression .  Plan: Follow up with behavioral health clinician on : Two week call; Call Jeni Duling at [phone], as needed. Behavioral recommendations:  -Continue taking prenatal vitamin daily as prescribed -Continue plan to use walk-in at [facility] to re-establish with psychiatry  -Continue using peer support and [employer] supports; consider new mom support group as needed (on After Visit Summary) -Continue prioritizing healthy self-care daily until postpartum medical visit  Referral(s): [place] (In Clinic), [place] (LME/Outside Clinic), and Community Resources:  new mom support and childcare  I discussed the assessment and treatment plan with the patient and/or parent/guardian. They were provided an opportunity to ask questions and all were answered. They agreed with the plan and demonstrated an understanding of the instructions.   They were advised to call back or seek an in-person evaluation if the symptoms worsen or if the condition fails to improve as anticipated.  [place], LCSW     10/14/2021    2:27 PM 03/05/2020    3:44 PM 08/31/2019    3:15 PM 03/27/2019    2:57 PM 11/21/2017    8:30 AM  Depression screen PHQ  2/9  Decreased Interest 3 2 2 2  0  Down, Depressed, Hopeless 2 2 2 2  0  PHQ - 2 Score 5 4 4 4  0  Altered sleeping 3 3 3     Tired, decreased energy 3 2 2     Change in appetite 0 2 3    Feeling bad or failure about yourself  2 2 0    Trouble concentrating 1 2 0    Moving slowly or fidgety/restless 3 2 2     Suicidal thoughts 0 0 0    PHQ-9 Score 17 17 14     Difficult doing work/chores  Not difficult at all Somewhat difficult        10/14/2021    2:32 PM  GAD 7 : Generalized Anxiety Score  Nervous, Anxious, on Edge 3  Control/stop worrying 3  Worry too much - different things 0  Trouble relaxing 1  Restless 3  Easily annoyed or irritable 3  Afraid - awful might happen 3  Total GAD 7 Score 16
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
## 2021-09-30 NOTE — Lactation Note (Addendum)
This note was copied from a baby's chart. Lactation Consultation Note  Patient Name: Cynthia Kent [id number] Date: 09/30/2021 Reason for consult: Follow-up assessment Age:31 hours, P3, C/S delivery, GDM- see mom's MR.  Infant had 6 voids and 2 stools since birth. Mom feels breastfeeding is going well, most feedings today has been 10, 20 to 30 minutes per feedings. Infant is currently cluster feeding. Per mom, she had finished breast feeding  infant for 30 minutes  prior to [facility] entering the room. Infant was cuing  again, mom hand expressed 3 mls of colostrum to spoon feed to infant so she could eat her dinner. Mom plans to latch infant again when she finish eating. Mom will continue to breastfeed infant on demand, by cues, 8 to 12+ times within 24 hours, skin to skin. Doesn't have any questions or concerns for LC at this time.  Maternal Data    Feeding Mother's Current Feeding Choice: Breast Milk  LATCH Score                    Lactation Tools Discussed/Used    Interventions Interventions: Skin to skin;Hand express;Education  Discharge    Consult Status Consult Status: Follow-up Date: 10/01/21 Follow-up type: In-patient    Vicente Serene 09/30/2021, 9:58 PM

## 2021-10-01 ENCOUNTER — Encounter: Payer: Medicaid Other | Admitting: Family Medicine

## 2021-10-01 ENCOUNTER — Inpatient Hospital Stay (HOSPITAL_COMMUNITY): Payer: Medicaid Other

## 2021-10-01 DIAGNOSIS — M79604 Pain in right leg: Secondary | ICD-10-CM

## 2021-10-01 DIAGNOSIS — M79605 Pain in left leg: Secondary | ICD-10-CM

## 2021-10-01 NOTE — Progress Notes (Signed)
VASCULAR LAB    Bilateral lower extremity venous duplex has been performed.  See CV proc for preliminary results.   Marabelle Cushman, RVT 10/01/2021, 4:06 PM

## 2021-10-01 NOTE — Lactation Note (Addendum)
This note was copied from a baby's chart. Lactation Consultation Note  Patient Name: Cynthia Kent [id number] Date: 10/01/2021 Reason for consult: Follow-up assessment;Term (-8% weight loss) Age:31 hours P3, C/S delivery. Per mom, infant BF for 30 minutes at 2100 pm. Mom feels breastfeeding is going well, per mom, her breast are starting to feel full. Mom open to using the DEBP to supplement infant with her EBM and help stabilize infant's  weight loss. Mom will continue to breastfeed infant according to hunger cues, on demand, 8 to 12+ times within 24 hours, skin to skin. Mom set up with DEBP and pumped 75 mls. Mom plans to continue to latch infant at breast and supplement infant with her EBM. Day 3 mom is supplementing infant with 30 mls per feeding after latching infant at the breast. Mom will pump 6 x or more within 24 hours.  Maternal Data    Feeding Mother's Current Feeding Choice: Breast Milk  LATCH Score                    Lactation Tools Discussed/Used Tools: Pump;Flanges Flange Size: 27 Breast pump type: Double-Electric Breast Pump Pump Education: Setup, frequency, and cleaning;Milk Storage Reason for Pumping: Infant with -8% weight loss, help stablize weight. Pumping frequency: Mom will pump 6 times within 24 hours. Pumped volume: 75 mL  Interventions    Discharge    Consult Status Consult Status: Follow-up Date: 10/02/21 Follow-up type: In-patient    Danelle Earthly 10/01/2021, 10:33 PM

## 2021-10-01 NOTE — Progress Notes (Addendum)
POSTPARTUM PROGRESS NOTE  Post Operative Day 2  Subjective:  Cynthia Kent is a 31 y.o. G3P3003 s/p rLTCS at [redacted]w[redacted]d.  No acute events overnight.  Patient denies problems with ambulating, voiding or PO intake.  She denies nausea or vomiting.  She has incisional and uterine cramping pain. It is moderately controlled. She has had flatus. She has not had bowel movement. Bleeding is similar to a period but improving. She has mild whole-body itching that is relieved temporarily by Benadryl.  Objective: Blood pressure 109/75, pulse 68, temperature 97.8 F (36.6 C), temperature source Oral, resp. rate 17, height $RemoveBe'5\' 5"'bxANJYzHC$  (1.651 m), weight 94.3 kg, SpO2 100 %, unknown if currently breastfeeding.  Physical Exam:  General: alert, cooperative and no distress Resp: normal work of breathing on room air Heart: normal rate, warm and well perfused Abdomen: soft, nontender Uterine Fundus: firm and below umbilicus Extremities: No LE edema. Bilateral calf tenderness to palpation, no unilateral swelling. Skin: warm, dry; incision clean/dry/intact  Recent Labs    09/28/21 1429 09/30/21 0535  HGB 12.2 10.3*  HCT 37.9 30.8*    Assessment/Plan: Cynthia Kent is a 31 y.o. G3P3003 s/p rLTCS at [redacted]w[redacted]d   POD#2:: Doing well. Meeting all milestones. VSS. Continue routine PP care  Rubella non-immune: Declined MMR  Contraception: ppIUD placed  Circumcision: completed  Feeding: breast  Dispo: Plan for discharge on POD#3.   LOS: 2 days   Leonette Nutting, Medical Student 10/01/2021, 7:56 AM   GME ATTESTATION:  I saw and evaluated the patient. I agree with the findings and the plan of care as documented in the student's note. Doing well overall. Does have some bilateral calf pain and TTP but no swelling. Given post op and post pregnancy, has increased DVT risk and therefore will get bilateral lower extremity US. Discussed MMR with patient om detail and patient at this time declines. We discussed  risks of Measles with patient for baby and patient. And patient expressed understanding and declines MMR at this time.  Renard Matter, MD, MPH OB Fellow, [place] [facility]

## 2021-10-02 MED ORDER — IBUPROFEN 600 MG PO TABS: 600.0000 mg | ORAL_TABLET | Freq: Four times a day (QID) | ORAL | 0 refills | Status: DC

## 2021-10-02 MED ORDER — OXYCODONE HCL 5 MG PO TABS: 5.0000 mg | ORAL_TABLET | ORAL | 0 refills | Status: DC | PRN

## 2021-10-02 NOTE — Lactation Note (Signed)
This note was copied from a baby's chart. Lactation Consultation Note  Patient Name: Cynthia Kent KPVVZ'[id number] Date: 10/02/2021 Reason for consult: Follow-up assessment Age:31 hours  P3, Baby cueing after feeding on one breast. Suggest offering second breast.  Baby latched with ease. Mother's nipples are tender.  Provided comfort gels. Reviewed engorgement care and monitoring voids/stools. Feed on demand with cues.  Goal 8-12+ times per day after first 24 hrs.  Place baby STS if not cueing.  Pacifier use not recommended at this time.    Feeding Mother's Current Feeding Choice: Breast Milk  LATCH Score Latch: Grasps breast easily, tongue down, lips flanged, rhythmical sucking.  Audible Swallowing: Spontaneous and intermittent  Type of Nipple: Everted at rest and after stimulation  Comfort (Breast/Nipple): Filling, red/small blisters or bruises, mild/mod discomfort  Hold (Positioning): Assistance needed to correctly position infant at breast and maintain latch.  LATCH Score: 8  Interventions Interventions: Breast feeding basics reviewed;Comfort gels;Education  Discharge Discharge Education: Engorgement and breast care;Warning signs for feeding baby  Consult Status Consult Status: Complete Date: 10/02/21    Dahlia Byes [facility] 10/02/2021, 12:19 PM

## 2021-10-05 ENCOUNTER — Telehealth (HOSPITAL_COMMUNITY): Payer: Self-pay | Admitting: *Deleted

## 2021-10-05 DIAGNOSIS — Z1331 Encounter for screening for depression: Secondary | ICD-10-CM

## 2021-10-05 NOTE — Telephone Encounter (Signed)
Inpatient EPDS=12. SW consult completed at that time. Amb IBH referral made today and Dr. Crissie Reese notified via chart.  Duffy Rhody, RN 10-05-2021 at 2:39pm

## 2021-10-14 ENCOUNTER — Ambulatory Visit (INDEPENDENT_AMBULATORY_CARE_PROVIDER_SITE_OTHER): Payer: Medicaid Other | Admitting: Clinical

## 2021-10-14 DIAGNOSIS — F909 Attention-deficit hyperactivity disorder, unspecified type: Secondary | ICD-10-CM

## 2021-10-14 DIAGNOSIS — F319 Bipolar disorder, unspecified: Secondary | ICD-10-CM | POA: Diagnosis not present

## 2021-10-14 DIAGNOSIS — F4310 Post-traumatic stress disorder, unspecified: Secondary | ICD-10-CM

## 2021-10-14 NOTE — Patient Instructions (Addendum)
Center for [facility] at [facility] [address] [place], [place] [address] [phone] (main office) [phone] [facility] office)  New Parent Support Groups [email or web address].net [email or web address]  [place] [facility]  (Childcare options, Early childcare development, etc.) [email or web address]

## 2021-10-15 ENCOUNTER — Encounter: Payer: Self-pay | Admitting: Family Medicine

## 2021-10-15 ENCOUNTER — Ambulatory Visit (INDEPENDENT_AMBULATORY_CARE_PROVIDER_SITE_OTHER): Payer: Medicaid Other | Admitting: Family Medicine

## 2021-10-15 VITALS — BP 120/68 | HR 64 | Wt 202.0 lb

## 2021-10-15 DIAGNOSIS — Z4889 Encounter for other specified surgical aftercare: Secondary | ICD-10-CM

## 2021-10-15 MED ORDER — IBUPROFEN 800 MG PO TABS: 800.0000 mg | ORAL_TABLET | Freq: Three times a day (TID) | ORAL | 3 refills | Status: AC | PRN

## 2021-10-15 MED ORDER — OXYCODONE HCL 5 MG PO TABS: 5.0000 mg | ORAL_TABLET | Freq: Four times a day (QID) | ORAL | 0 refills | Status: AC | PRN

## 2021-10-15 NOTE — Patient Instructions (Signed)
For long car rides: Needs to stop every 3 hours and walk around for 15 minutes in order to help prevent blood clots. Due to recent delivery and recent surgery you are at risk of developing blood clots.

## 2021-10-15 NOTE — Progress Notes (Signed)
   Subjective:    Patient ID: Cynthia Kent, female    DOB: 07-31-1990, 31 y.o.   MRN: [id number]  HPI Patient seen for incision check.  Overall, she is doing very well and having no problems.  She does still have quite a bit of pain occasionally with her incision.  She is using ibuprofen 600 mg every 6 hours and 3-4 Percocet per day in between the ibuprofen.  She reports no drainage from the incision.   Review of Systems     Objective:   Physical Exam Vitals reviewed.  Constitutional:      Appearance: Normal appearance.  Abdominal:     Comments: Incision clean, dry, intact.  There is no erythema or drainage from the area.  It is very sensitive to palpation.  Neurological:     Mental Status: She is alert.       Assessment & Plan:  1. Encounter for post surgical wound check We will give limited supply of Percocet and refill prescription strength ibuprofen.  Incision looks good.  Patient is planning on traveling to [place] this weekend.  I discussed with her that she needs to get out of halfway (3 hours) and walk around as being postpartum and post cesarean section she is at risk of developing DVT.

## 2021-10-21 ENCOUNTER — Telehealth: Payer: Self-pay

## 2021-10-21 NOTE — Telephone Encounter (Signed)
Patient called the after hours nurse line stating she is feeling overwhelmed. States she is having headaches and vision changes. Pt states BP was 110/70. She also states she is having some constipation. Advised pt to go to [facility] if she continues to have headaches and vision changes. Also advised pt to try colace, miralax, and to drink more water to help with the constipation. Understanding was voiced. Artesia Berkey l Keylon Labelle, CMA

## 2021-11-19 ENCOUNTER — Telehealth: Payer: Self-pay | Admitting: Clinical

## 2021-11-19 NOTE — Telephone Encounter (Signed)
Attempt call for brief pp follow-up; pt's mother answered and says she will give pt number to call back [place]. No other information given.

## 2021-11-19 NOTE — Telephone Encounter (Signed)
Call regarding pt plan to re-establish with psychiatry at [facility] in [facility]; pt states she missed appointment scheduled yesterday; plans to call [facility] back to reschedule; no immediate concern at this time.   Pt is aware she has appointment with her OB/GYN on 11/26/21; plans to attend that scheduled visit.   Pt is encouraged to call back Asher Muir at [phone] if she has any other questions or concerns.

## 2021-11-26 ENCOUNTER — Encounter: Payer: Self-pay | Admitting: Family Medicine

## 2021-11-26 ENCOUNTER — Ambulatory Visit (INDEPENDENT_AMBULATORY_CARE_PROVIDER_SITE_OTHER): Payer: Medicaid Other | Admitting: Family Medicine

## 2021-11-26 ENCOUNTER — Ambulatory Visit: Payer: Medicaid Other | Admitting: Family Medicine

## 2021-11-26 DIAGNOSIS — Z8632 Personal history of gestational diabetes: Secondary | ICD-10-CM

## 2021-11-26 DIAGNOSIS — F4310 Post-traumatic stress disorder, unspecified: Secondary | ICD-10-CM

## 2021-11-26 DIAGNOSIS — F32A Depression, unspecified: Secondary | ICD-10-CM

## 2021-11-26 DIAGNOSIS — F419 Anxiety disorder, unspecified: Secondary | ICD-10-CM | POA: Diagnosis not present

## 2021-11-26 NOTE — Progress Notes (Signed)
Post Partum Visit Note  Cynthia Kent is a 31 y.o. G59P3003 female who presents for a postpartum visit. She is 8 weeks postpartum following a repeat cesarean section.  I have fully reviewed the prenatal and intrapartum course. The delivery was at 39 gestational weeks.  Anesthesia: spinal. Postpartum course has been good. Baby is doing well. Baby is feeding by breast. Bleeding no bleeding. Bowel function is  occasional constipation . Bladder function is normal. Patient  not  sexually active. Contraception method is IUD. Postpartum depression screening: positive.   The pregnancy intention screening data noted above was reviewed. Potential methods of contraception were discussed. The patient elected to proceed with No data recorded.   Edinburgh Postnatal Depression Scale - 11/26/21 0851       Edinburgh Postnatal Depression Scale:  In the Past 7 Days   I have been able to laugh and see the funny side of things. 1    I have looked forward with enjoyment to things. 1    I have blamed myself unnecessarily when things went wrong. 2    I have been anxious or worried for no good reason. 2    I have felt scared or panicky for no good reason. 2    Things have been getting on top of me. 2    I have been so unhappy that I have had difficulty sleeping. 1    I have felt sad or miserable. 2    I have been so unhappy that I have been crying. 1    The thought of harming myself has occurred to me. 0    Edinburgh Postnatal Depression Scale Total 14             Health Maintenance Due  Topic Date Due   COVID-19 Vaccine (1) Never done   URINE MICROALBUMIN  Never done   PAP SMEAR-Modifier  07/12/2020   INFLUENZA VACCINE  11/24/2021    The following portions of the patient's history were reviewed and updated as appropriate: allergies, current medications, past family history, past medical history, past social history, past surgical history, and problem list.  Review of Systems Pertinent  items are noted in HPI.  Objective:  BP 124/74   Pulse 69   Wt 210 lb (95.3 kg)   Breastfeeding Yes   BMI 34.95 kg/m    General:  alert, cooperative, and no distress   Breasts:  not indicated  Lungs: clear to auscultation bilaterally  Heart:  regular rate and rhythm, S1, S2 normal, no murmur, click, rub or gallop  Abdomen: soft, non-tender; bowel sounds normal; no masses,  no organomegaly   Wound well approximated incision  GU exam:   Normal exam. IUD strings seen and trimmed.       Assessment:     1. Postpartum care and examination   2. History of gestational diabetes   3. PTSD (post-traumatic stress disorder)   4. Anxiety and depression      Plan:   Essential components of care per ACOG recommendations:  1.  Mood and well being: Patient with positive depression screening today. Reviewed local resources for support. She has a Therapist, sports and counselor - just needs to schedule appointments with them. - Patient tobacco use? No.   - hx of drug use? No.    2. Infant care and feeding:  -Patient currently breastmilk feeding? Yes. Reviewed importance of draining breast regularly to support lactation.  -Social determinants of health (SDOH) reviewed in EPIC. No concerns  3. Sexuality, contraception and birth spacing - Patient does not want a pregnancy in the next year.   - Reviewed reproductive life planning. Reviewed contraceptive methods based on pt preferences and effectiveness.  Patient desired IUD or IUS today.   - Discussed birth spacing of 18 months  4. Sleep and fatigue -Encouraged family/partner/community support of 4 hrs of uninterrupted sleep to help with mood and fatigue  5. Physical Recovery  - Discussed patients delivery and complications. She describes her labor as good. - Patient had a C-section.  - Patient has urinary incontinence? No. - Patient is safe to resume physical and sexual activity  6.  Health Maintenance - HM due items addressed Yes - Last  pap smear  Diagnosis  Date Value Ref Range Status  07/12/2017   Final   NEGATIVE FOR INTRAEPITHELIAL LESIONS OR MALIGNANCY.   Pap smear not done at today's visit.  -Breast Cancer screening indicated? No.   7. Chronic Disease/Pregnancy Condition follow up: None  - PCP follow up  Levie Heritage, DO Center for [facility], [facility]

## 2021-11-27 LAB — GLUCOSE TOLERANCE, 2 HOURS
Glucose, 2 hour: 55 mg/dL — ABNORMAL LOW (ref 70–139)
Glucose, GTT - Fasting: 149 mg/dL — ABNORMAL HIGH (ref 70–99)

## 2022-02-18 ENCOUNTER — Telehealth: Payer: Self-pay

## 2022-02-18 NOTE — Telephone Encounter (Signed)
Patient called because her peds office states she needs to be treated for thrush since her baby has it. Made patient aware that [name] recommends lotromin after every feeding. Kathrene Alu RN
